# Patient Record
Sex: Male | Born: 1940
Health system: Southern US, Community
[De-identification: ages and names within clinical notes are randomized; demographics above are authoritative.]

## PROBLEM LIST (undated history)

## (undated) DIAGNOSIS — Z8711 Personal history of peptic ulcer disease: Secondary | ICD-10-CM

## (undated) DIAGNOSIS — I10 Essential (primary) hypertension: Secondary | ICD-10-CM

## (undated) DIAGNOSIS — Z8719 Personal history of other diseases of the digestive system: Secondary | ICD-10-CM

## (undated) DIAGNOSIS — H9190 Unspecified hearing loss, unspecified ear: Secondary | ICD-10-CM

## (undated) DIAGNOSIS — D696 Thrombocytopenia, unspecified: Secondary | ICD-10-CM

## (undated) DIAGNOSIS — K219 Gastro-esophageal reflux disease without esophagitis: Secondary | ICD-10-CM

## (undated) DIAGNOSIS — E119 Type 2 diabetes mellitus without complications: Secondary | ICD-10-CM

## (undated) DIAGNOSIS — J309 Allergic rhinitis, unspecified: Secondary | ICD-10-CM

## (undated) DIAGNOSIS — E785 Hyperlipidemia, unspecified: Secondary | ICD-10-CM

## (undated) HISTORY — DX: Essential (primary) hypertension: I10

## (undated) HISTORY — DX: Gastro-esophageal reflux disease without esophagitis: K21.9

## (undated) HISTORY — DX: Personal history of peptic ulcer disease: Z87.11

## (undated) HISTORY — DX: Type 2 diabetes mellitus without complications: E11.9

## (undated) HISTORY — DX: Allergic rhinitis, unspecified: J30.9

## (undated) HISTORY — DX: Unspecified hearing loss, unspecified ear: H91.90

## (undated) HISTORY — DX: Thrombocytopenia, unspecified: D69.6

## (undated) HISTORY — DX: Hyperlipidemia, unspecified: E78.5

## (undated) HISTORY — DX: Personal history of other diseases of the digestive system: Z87.19

---

## 1988-01-31 HISTORY — PX: NASAL SEPTUM SURGERY: SHX37

## 2005-12-29 ENCOUNTER — Ambulatory Visit: Payer: Self-pay | Admitting: Internal Medicine

## 2006-02-01 ENCOUNTER — Ambulatory Visit: Payer: Self-pay | Admitting: Internal Medicine

## 2006-08-29 ENCOUNTER — Ambulatory Visit: Payer: Self-pay | Admitting: Internal Medicine

## 2006-08-29 LAB — CONVERTED CEMR LAB
Basophils Relative: 0 % (ref 0–1)
HDL: 36 mg/dL — ABNORMAL LOW (ref >39)
Hemoglobin: 15 g/dL (ref 13.0–17.0)
LDL Cholesterol: 118 mg/dL — ABNORMAL HIGH (ref 0–99)
Lymphocytes Relative: 38 % (ref 12–46)
Lymphs Abs: 2 10*3/uL (ref 0.7–3.3)
Monocytes Absolute: 0.4 10*3/uL (ref 0.2–0.7)
Monocytes Relative: 7 % (ref 3–11)
Neutro Abs: 2.8 10*3/uL (ref 1.7–7.7)
Neutrophils Relative %: 54 % (ref 43–77)
PSA: 0.32 ng/mL (ref 0.10–4.00)
RBC: 4.87 M/uL (ref 4.22–5.81)
Total CHOL/HDL Ratio: 5.2
Triglycerides: 159 mg/dL — ABNORMAL HIGH (ref <150)
VLDL: 32 mg/dL (ref 0–40)
WBC: 5.2 10*3/uL (ref 4.0–10.5)

## 2006-09-28 ENCOUNTER — Ambulatory Visit: Payer: Self-pay | Admitting: Internal Medicine

## 2006-09-28 DIAGNOSIS — I152 Hypertension secondary to endocrine disorders: Secondary | ICD-10-CM | POA: Insufficient documentation

## 2006-09-28 DIAGNOSIS — I10 Essential (primary) hypertension: Secondary | ICD-10-CM

## 2006-09-28 DIAGNOSIS — K219 Gastro-esophageal reflux disease without esophagitis: Secondary | ICD-10-CM | POA: Insufficient documentation

## 2006-09-28 LAB — CONVERTED CEMR LAB
Basophils Absolute: 0 10*3/uL (ref 0.0–0.1)
Basophils Relative: 1 % (ref 0–1)
Eosinophils Relative: 1 % (ref 0–5)
Hemoglobin: 15 g/dL (ref 13.0–17.0)
MCHC: 33.1 g/dL (ref 30.0–36.0)
Monocytes Absolute: 0.4 10*3/uL (ref 0.2–0.7)
Neutro Abs: 3.9 10*3/uL (ref 1.7–7.7)
Platelets: 117 10*3/uL — ABNORMAL LOW (ref 150–400)
RDW: 13.1 % (ref 11.5–14.0)

## 2006-10-16 ENCOUNTER — Ambulatory Visit: Payer: Self-pay | Admitting: Internal Medicine

## 2006-10-24 LAB — CONVERTED CEMR LAB: HCV Ab: NEGATIVE

## 2007-04-26 ENCOUNTER — Ambulatory Visit: Payer: Self-pay | Admitting: Internal Medicine

## 2007-04-26 DIAGNOSIS — M542 Cervicalgia: Secondary | ICD-10-CM | POA: Insufficient documentation

## 2007-04-26 DIAGNOSIS — K279 Peptic ulcer, site unspecified, unspecified as acute or chronic, without hemorrhage or perforation: Secondary | ICD-10-CM | POA: Insufficient documentation

## 2007-05-02 LAB — CONVERTED CEMR LAB
ALT: 22 units/L (ref 0–53)
AST: 24 units/L (ref 0–37)
Albumin: 4.7 g/dL (ref 3.5–5.2)
Alkaline Phosphatase: 80 units/L (ref 39–117)
Basophils Relative: 0 % (ref 0–1)
Eosinophils Absolute: 0.1 10*3/uL (ref 0.0–0.7)
MCHC: 34.4 g/dL (ref 30.0–36.0)
MCV: 91 fL (ref 78.0–100.0)
Neutro Abs: 3.6 10*3/uL (ref 1.7–7.7)
Neutrophils Relative %: 59 % (ref 43–77)
Platelets: 114 10*3/uL — ABNORMAL LOW (ref 150–400)
Potassium: 4.1 meq/L (ref 3.5–5.3)
Sodium: 144 meq/L (ref 135–145)
Total Protein: 7.1 g/dL (ref 6.0–8.3)
WBC: 6 10*3/uL (ref 4.0–10.5)

## 2007-05-06 ENCOUNTER — Ambulatory Visit: Payer: Self-pay | Admitting: *Deleted

## 2007-05-08 ENCOUNTER — Ambulatory Visit: Payer: Self-pay | Admitting: Internal Medicine

## 2007-05-08 DIAGNOSIS — R7309 Other abnormal glucose: Secondary | ICD-10-CM

## 2007-06-13 ENCOUNTER — Ambulatory Visit: Payer: Self-pay | Admitting: Internal Medicine

## 2007-06-13 DIAGNOSIS — J309 Allergic rhinitis, unspecified: Secondary | ICD-10-CM | POA: Insufficient documentation

## 2007-06-13 DIAGNOSIS — E785 Hyperlipidemia, unspecified: Secondary | ICD-10-CM

## 2007-06-13 DIAGNOSIS — D485 Neoplasm of uncertain behavior of skin: Secondary | ICD-10-CM

## 2007-06-13 DIAGNOSIS — N508 Other specified disorders of male genital organs: Secondary | ICD-10-CM | POA: Insufficient documentation

## 2007-06-13 LAB — CONVERTED CEMR LAB
HDL: 37 mg/dL — ABNORMAL LOW (ref >39)
LDL Cholesterol: 100 mg/dL — ABNORMAL HIGH (ref 0–99)
Nitrite: NEGATIVE
Protein, U semiquant: NEGATIVE
Specific Gravity, Urine: <1.005
Urobilinogen, UA: 0.2
WBC Urine, dipstick: NEGATIVE

## 2007-06-17 ENCOUNTER — Encounter (INDEPENDENT_AMBULATORY_CARE_PROVIDER_SITE_OTHER): Payer: Self-pay | Admitting: Internal Medicine

## 2007-06-19 ENCOUNTER — Ambulatory Visit (HOSPITAL_COMMUNITY): Admission: RE | Admit: 2007-06-19 | Discharge: 2007-06-19 | Payer: Self-pay | Admitting: Internal Medicine

## 2007-08-20 ENCOUNTER — Ambulatory Visit: Payer: Self-pay | Admitting: Internal Medicine

## 2007-09-03 ENCOUNTER — Encounter (INDEPENDENT_AMBULATORY_CARE_PROVIDER_SITE_OTHER): Payer: Self-pay | Admitting: Internal Medicine

## 2007-09-04 ENCOUNTER — Telehealth (INDEPENDENT_AMBULATORY_CARE_PROVIDER_SITE_OTHER): Payer: Self-pay | Admitting: Internal Medicine

## 2007-09-13 ENCOUNTER — Ambulatory Visit: Payer: Self-pay | Admitting: Internal Medicine

## 2007-09-14 ENCOUNTER — Encounter (INDEPENDENT_AMBULATORY_CARE_PROVIDER_SITE_OTHER): Payer: Self-pay | Admitting: Internal Medicine

## 2007-09-14 LAB — CONVERTED CEMR LAB
ALT: 21 units/L (ref 0–53)
CO2: 28 meq/L (ref 19–32)
Chloride: 104 meq/L (ref 96–112)
Cholesterol: 188 mg/dL (ref 0–200)
Potassium: 4.3 meq/L (ref 3.5–5.3)
Sodium: 143 meq/L (ref 135–145)
Total Bilirubin: 0.6 mg/dL (ref 0.3–1.2)
Total Protein: 6.9 g/dL (ref 6.0–8.3)
VLDL: 55 mg/dL — ABNORMAL HIGH (ref 0–40)

## 2007-11-29 ENCOUNTER — Ambulatory Visit: Payer: Self-pay | Admitting: Internal Medicine

## 2008-02-07 ENCOUNTER — Telehealth (INDEPENDENT_AMBULATORY_CARE_PROVIDER_SITE_OTHER): Payer: Self-pay | Admitting: Internal Medicine

## 2008-02-28 ENCOUNTER — Ambulatory Visit: Payer: Self-pay | Admitting: Internal Medicine

## 2008-06-16 IMAGING — US US SCROTUM
1 series · 14 of 25 positions shown · non-contrast
Comparison: None.

CLINICAL DATA: Right scrotal mass, left epididymal thickening

ULTRASOUND OF SCROTUM
TECHNIQUE: Complete ultrasound examination of the testicles,
epididymis, and other scrotal structures was performed.

[Series 1: unknown · 0.07mm/px · 14 of 56 slices shown]
[im 1/56]
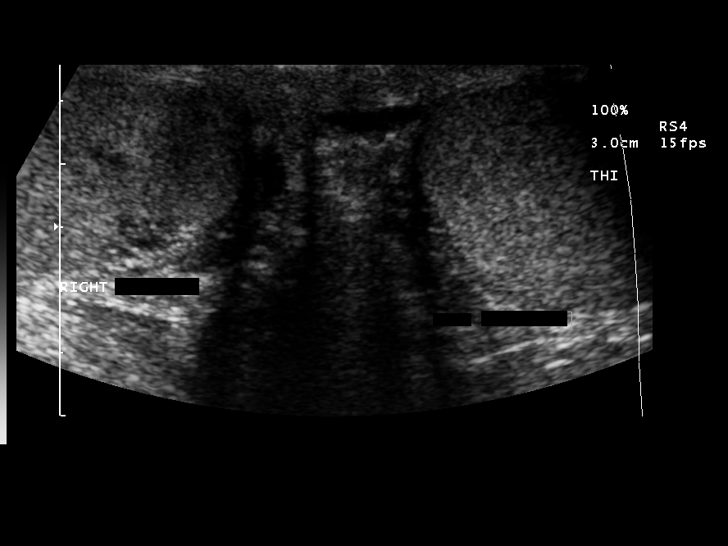
[im 5/56]
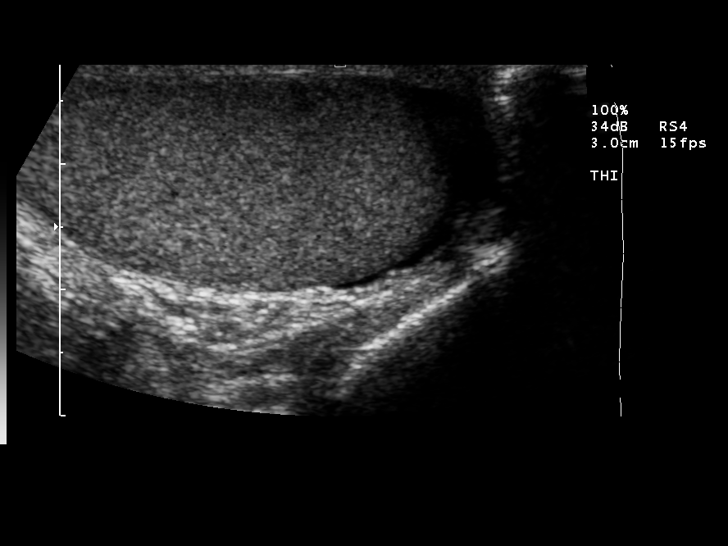
[im 10/56]
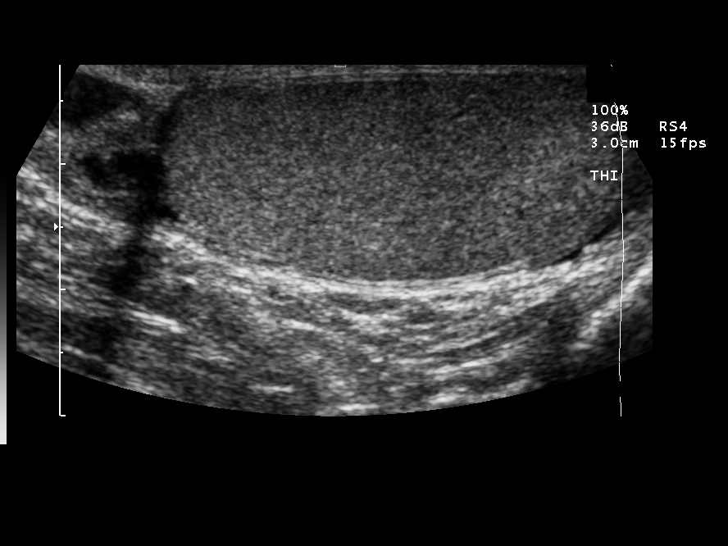
[im 14/56]
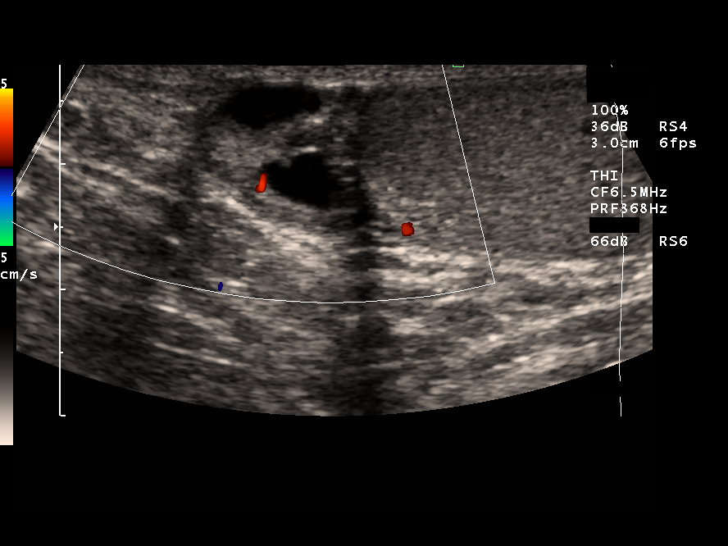
[im 19/56]
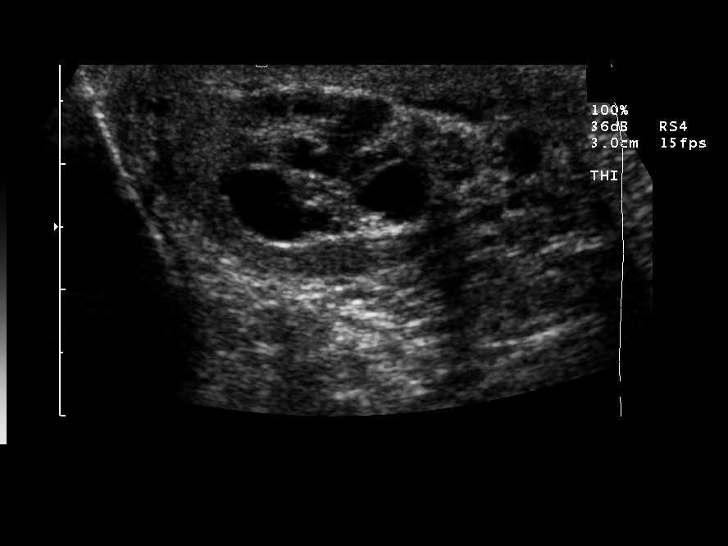
[im 21/56]
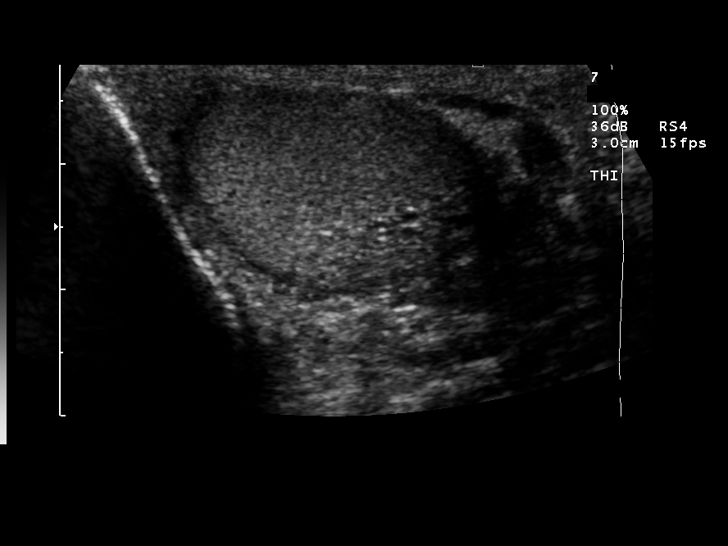
[im 26/56]
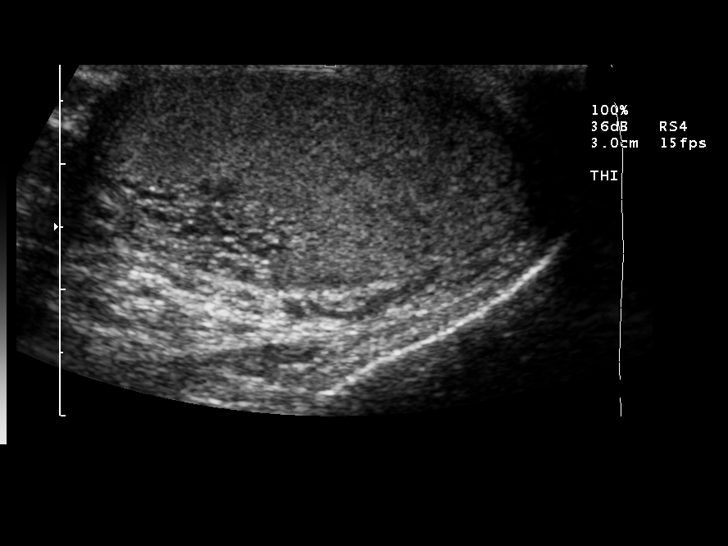
[im 30/56]
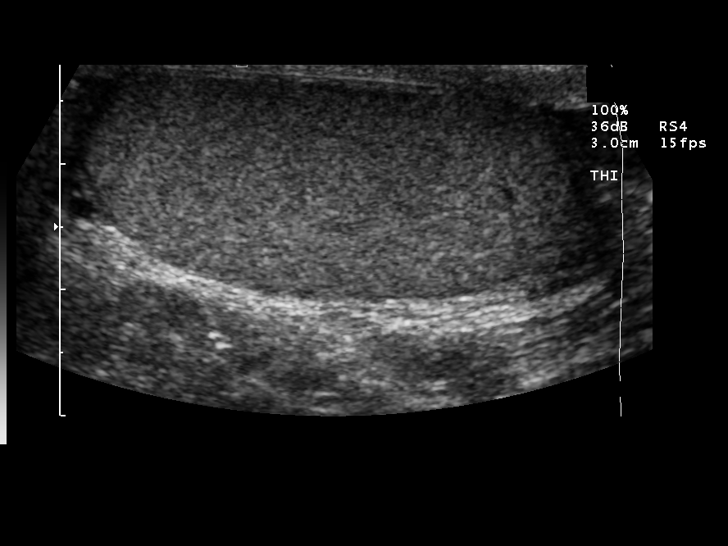
[im 35/56]
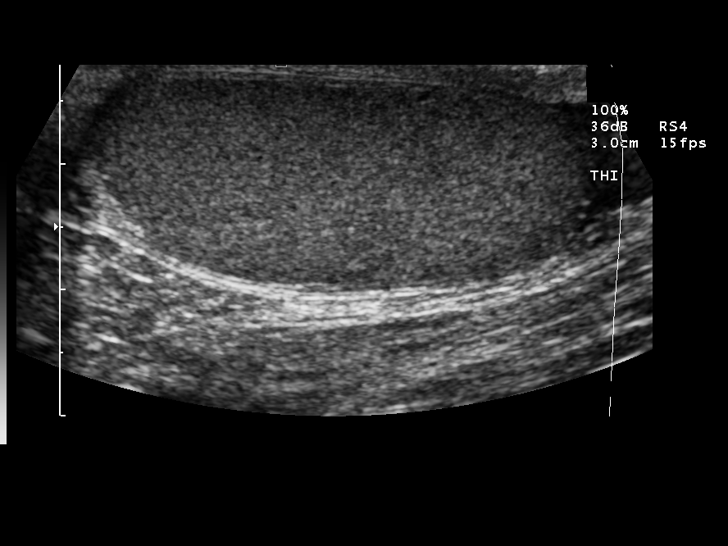
[im 37/56]
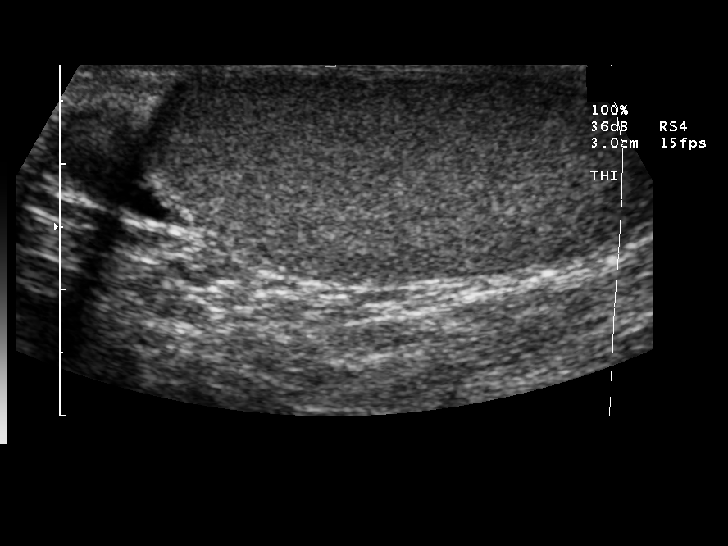
[im 42/56]
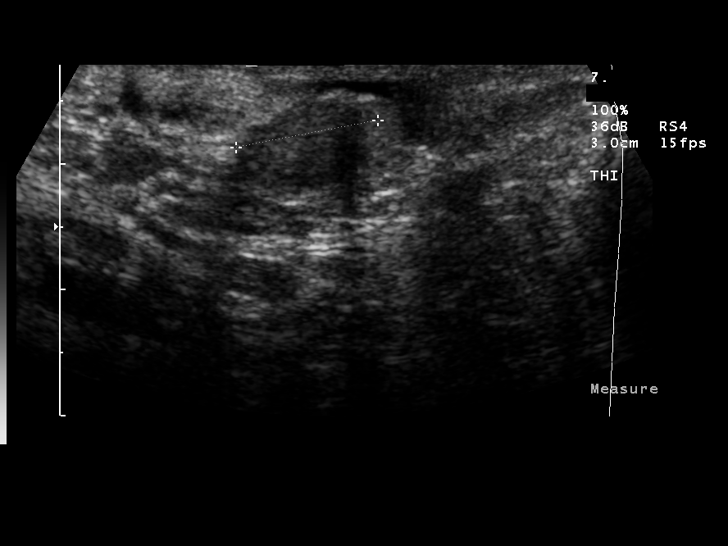
[im 46/56]
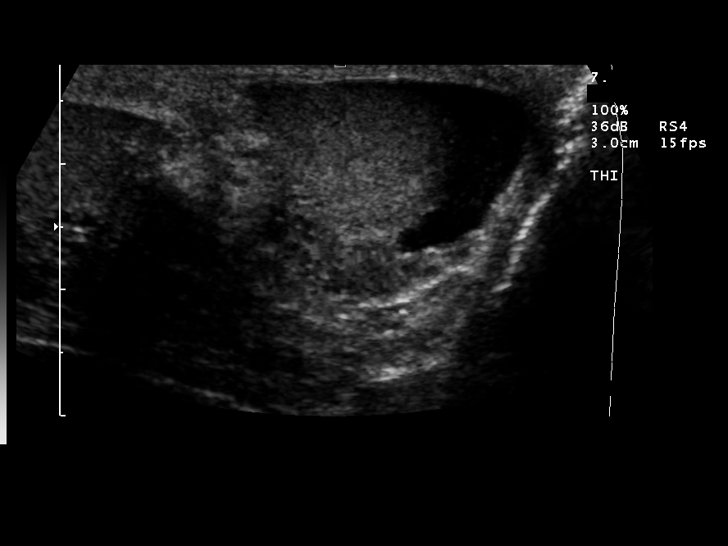
[im 51/56]
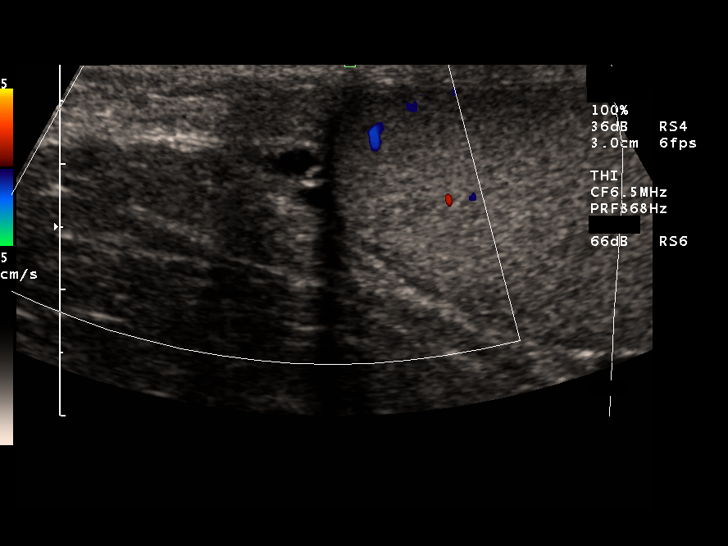
[im 56/56]
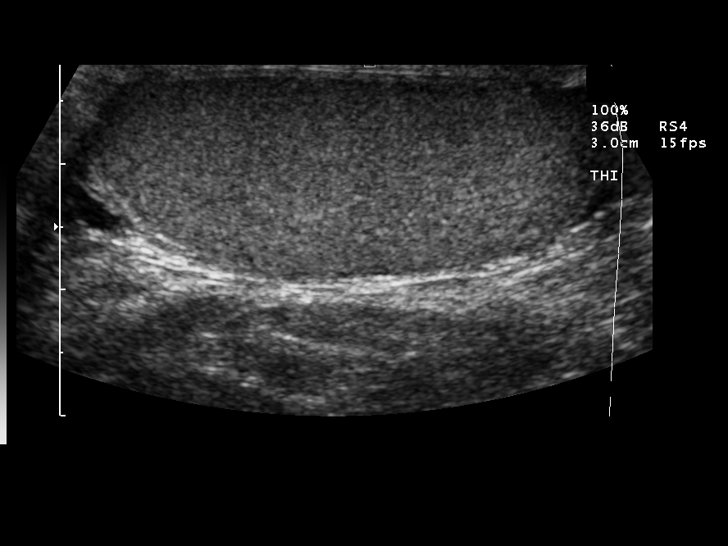

[14 of 25 positions shown; findings below may reference images not displayed]

FINDINGS: Right testicle measures 3.5 x 1.7 x 3.1 cm.  Normal color
Doppler flow.  No hyperemia.  Left testicle measures 4.1 x 1.8 x
3.0 cm.  No definite intra testicular abnormality.  There is a
small left hydrocele noted.  Left epididymis demonstrates a small 4
mm cyst in the epididymal head.  The right epididymis demonstrates
a 2 cm complex cystic area with septation in the epididymal head
consistent with a minimally complex epididymal cyst versus
spermatocele. No varicocele demonstrated.
IMPRESSION: 2 cm right epididymal head complex cyst versus spermatocele

No definite testicular abnormality.

4 mm left epididymal cyst.

Small simple left hydrocele.

## 2008-06-25 ENCOUNTER — Encounter (INDEPENDENT_AMBULATORY_CARE_PROVIDER_SITE_OTHER): Payer: Self-pay | Admitting: Internal Medicine

## 2008-07-02 ENCOUNTER — Ambulatory Visit: Payer: Self-pay | Admitting: Internal Medicine

## 2008-07-20 LAB — CONVERTED CEMR LAB
HDL: 34 mg/dL — ABNORMAL LOW (ref >39)
LDL Cholesterol: 125 mg/dL — ABNORMAL HIGH (ref 0–99)
Triglycerides: 230 mg/dL — ABNORMAL HIGH (ref <150)

## 2008-09-04 ENCOUNTER — Ambulatory Visit: Payer: Self-pay | Admitting: Internal Medicine

## 2008-09-04 LAB — CONVERTED CEMR LAB
Blood in Urine, dipstick: NEGATIVE
Nitrite: NEGATIVE
Protein, U semiquant: 30
WBC Urine, dipstick: NEGATIVE

## 2008-09-09 ENCOUNTER — Ambulatory Visit: Payer: Self-pay | Admitting: Internal Medicine

## 2008-09-14 ENCOUNTER — Encounter (INDEPENDENT_AMBULATORY_CARE_PROVIDER_SITE_OTHER): Payer: Self-pay | Admitting: Internal Medicine

## 2008-10-07 ENCOUNTER — Ambulatory Visit: Payer: Self-pay | Admitting: Internal Medicine

## 2008-10-07 LAB — CONVERTED CEMR LAB
Albumin: 4.2 g/dL (ref 3.5–5.2)
Alkaline Phosphatase: 58 units/L (ref 39–117)
BUN: 23 mg/dL (ref 6–23)
Basophils Absolute: 0 10*3/uL (ref 0.0–0.1)
CO2: 27 meq/L (ref 19–32)
Calcium: 8.9 mg/dL (ref 8.4–10.5)
Chloride: 104 meq/L (ref 96–112)
Glucose, Bld: 81 mg/dL (ref 70–99)
LDL Cholesterol: 106 mg/dL — ABNORMAL HIGH (ref 0–99)
MCHC: 32.6 g/dL (ref 30.0–36.0)
MCV: 95.6 fL (ref 78.0–100.0)
Neutrophils Relative %: 55 % (ref 43–77)
Platelets: 101 10*3/uL — ABNORMAL LOW (ref 150–400)
Potassium: 4.4 meq/L (ref 3.5–5.3)
RDW: 14 % (ref 11.5–15.5)
Sodium: 143 meq/L (ref 135–145)
Total Protein: 6.7 g/dL (ref 6.0–8.3)
Triglycerides: 122 mg/dL (ref <150)

## 2008-10-15 ENCOUNTER — Encounter (INDEPENDENT_AMBULATORY_CARE_PROVIDER_SITE_OTHER): Payer: Self-pay | Admitting: Internal Medicine

## 2008-11-09 ENCOUNTER — Ambulatory Visit: Payer: Self-pay | Admitting: Internal Medicine

## 2009-01-30 HISTORY — PX: COLONOSCOPY: SHX174

## 2009-03-18 ENCOUNTER — Ambulatory Visit: Payer: Self-pay | Admitting: Internal Medicine

## 2009-03-18 DIAGNOSIS — R634 Abnormal weight loss: Secondary | ICD-10-CM | POA: Insufficient documentation

## 2009-03-26 ENCOUNTER — Encounter (INDEPENDENT_AMBULATORY_CARE_PROVIDER_SITE_OTHER): Payer: Self-pay | Admitting: Internal Medicine

## 2009-03-30 ENCOUNTER — Encounter (INDEPENDENT_AMBULATORY_CARE_PROVIDER_SITE_OTHER): Payer: Self-pay | Admitting: Internal Medicine

## 2009-04-03 ENCOUNTER — Encounter (INDEPENDENT_AMBULATORY_CARE_PROVIDER_SITE_OTHER): Payer: Self-pay | Admitting: Internal Medicine

## 2009-04-07 LAB — CONVERTED CEMR LAB
ALT: 26 units/L (ref 0–53)
Albumin: 4.4 g/dL (ref 3.5–5.2)
CO2: 28 meq/L (ref 19–32)
Calcium: 9.3 mg/dL (ref 8.4–10.5)
Chloride: 105 meq/L (ref 96–112)
Cholesterol: 192 mg/dL (ref 0–200)
Eosinophils Absolute: 0.1 10*3/uL (ref 0.0–0.7)
Hemoglobin: 14.4 g/dL (ref 13.0–17.0)
Lymphs Abs: 1.7 10*3/uL (ref 0.7–4.0)
MCV: 93.8 fL (ref 78.0–100.0)
Monocytes Absolute: 0.4 10*3/uL (ref 0.1–1.0)
Monocytes Relative: 8 % (ref 3–12)
Neutro Abs: 3.2 10*3/uL (ref 1.7–7.7)
Neutrophils Relative %: 59 % (ref 43–77)
RBC: 4.86 M/uL (ref 4.22–5.81)
Sodium: 144 meq/L (ref 135–145)
Total Protein: 7.4 g/dL (ref 6.0–8.3)
WBC: 5.4 10*3/uL (ref 4.0–10.5)

## 2009-04-08 ENCOUNTER — Ambulatory Visit (HOSPITAL_COMMUNITY): Admission: RE | Admit: 2009-04-08 | Discharge: 2009-04-08 | Payer: Self-pay | Admitting: Gastroenterology

## 2009-04-08 ENCOUNTER — Telehealth (INDEPENDENT_AMBULATORY_CARE_PROVIDER_SITE_OTHER): Payer: Self-pay | Admitting: Internal Medicine

## 2009-04-19 ENCOUNTER — Telehealth (INDEPENDENT_AMBULATORY_CARE_PROVIDER_SITE_OTHER): Payer: Self-pay | Admitting: Internal Medicine

## 2009-04-20 ENCOUNTER — Telehealth (INDEPENDENT_AMBULATORY_CARE_PROVIDER_SITE_OTHER): Payer: Self-pay | Admitting: Internal Medicine

## 2009-04-29 ENCOUNTER — Ambulatory Visit: Payer: Self-pay | Admitting: Internal Medicine

## 2009-05-14 LAB — CONVERTED CEMR LAB: Platelets: 99 10*3/uL — ABNORMAL LOW (ref 150–400)

## 2009-05-28 ENCOUNTER — Ambulatory Visit: Payer: Self-pay | Admitting: Internal Medicine

## 2009-06-27 ENCOUNTER — Encounter (INDEPENDENT_AMBULATORY_CARE_PROVIDER_SITE_OTHER): Payer: Self-pay | Admitting: Internal Medicine

## 2009-06-27 ENCOUNTER — Telehealth (INDEPENDENT_AMBULATORY_CARE_PROVIDER_SITE_OTHER): Payer: Self-pay | Admitting: Internal Medicine

## 2009-06-27 LAB — CONVERTED CEMR LAB
Haptoglobin: 123 mg/dL (ref 16–200)
LDH: 161 units/L (ref 94–250)

## 2009-07-02 ENCOUNTER — Ambulatory Visit: Payer: Self-pay | Admitting: Oncology

## 2009-07-14 ENCOUNTER — Encounter (INDEPENDENT_AMBULATORY_CARE_PROVIDER_SITE_OTHER): Payer: Self-pay | Admitting: Internal Medicine

## 2009-07-14 LAB — CBC WITH DIFFERENTIAL/PLATELET
BASO%: 0.6 % (ref 0.0–2.0)
Basophils Absolute: 0 10*3/uL (ref 0.0–0.1)
EOS%: 2.3 % (ref 0.0–7.0)
MCH: 30.9 pg (ref 27.2–33.4)
MCHC: 33.5 g/dL (ref 32.0–36.0)
MCV: 92.2 fL (ref 79.3–98.0)
MONO%: 6.4 % (ref 0.0–14.0)
RBC: 4.61 10*6/uL (ref 4.20–5.82)
RDW: 13.4 % (ref 11.0–14.6)
lymph#: 1.3 10*3/uL (ref 0.9–3.3)

## 2009-07-14 LAB — COMPREHENSIVE METABOLIC PANEL
AST: 27 U/L (ref 0–37)
Albumin: 3.8 g/dL (ref 3.5–5.2)
Alkaline Phosphatase: 69 U/L (ref 39–117)
BUN: 23 mg/dL (ref 6–23)
Calcium: 9.3 mg/dL (ref 8.4–10.5)
Creatinine, Ser: 1.07 mg/dL (ref 0.40–1.50)
Glucose, Bld: 114 mg/dL — ABNORMAL HIGH (ref 70–99)
Potassium: 4.4 mEq/L (ref 3.5–5.3)

## 2009-07-14 LAB — MORPHOLOGY: PLT EST: DECREASED

## 2009-07-16 LAB — VITAMIN B12: Vitamin B-12: 441 pg/mL (ref 211–911)

## 2009-07-16 LAB — PROTEIN ELECTROPHORESIS, SERUM
Albumin ELP: 61.8 % (ref 55.8–66.1)
Total Protein, Serum Electrophoresis: 6.8 g/dL (ref 6.0–8.3)

## 2009-07-16 LAB — HEPATITIS B SURFACE ANTIBODY,QUALITATIVE: Hep B S Ab: NEGATIVE

## 2009-07-16 LAB — ANA: Anti Nuclear Antibody(ANA): NEGATIVE

## 2009-07-16 LAB — HEPATITIS B SURFACE ANTIGEN: Hepatitis B Surface Ag: NEGATIVE

## 2009-07-29 ENCOUNTER — Encounter (INDEPENDENT_AMBULATORY_CARE_PROVIDER_SITE_OTHER): Payer: Self-pay | Admitting: Internal Medicine

## 2009-08-05 ENCOUNTER — Ambulatory Visit: Payer: Self-pay | Admitting: Oncology

## 2009-08-09 ENCOUNTER — Ambulatory Visit: Payer: Self-pay | Admitting: Internal Medicine

## 2009-08-09 LAB — CONVERTED CEMR LAB
Basophils Relative: 1 % (ref 0–1)
Lymphs Abs: 1.7 10*3/uL (ref 0.7–4.0)
MCHC: 33.8 g/dL (ref 30.0–36.0)
Monocytes Relative: 9 % (ref 3–12)
Neutro Abs: 3.5 10*3/uL (ref 1.7–7.7)
Neutrophils Relative %: 60 % (ref 43–77)
RBC: 4.56 M/uL (ref 4.22–5.81)
WBC: 5.8 10*3/uL (ref 4.0–10.5)

## 2009-09-07 ENCOUNTER — Ambulatory Visit: Payer: Self-pay | Admitting: Internal Medicine

## 2009-09-15 ENCOUNTER — Telehealth (INDEPENDENT_AMBULATORY_CARE_PROVIDER_SITE_OTHER): Payer: Self-pay | Admitting: Nurse Practitioner

## 2009-09-16 ENCOUNTER — Encounter (INDEPENDENT_AMBULATORY_CARE_PROVIDER_SITE_OTHER): Payer: Self-pay | Admitting: Internal Medicine

## 2009-09-16 LAB — CONVERTED CEMR LAB
Basophils Relative: 0 % (ref 0–1)
Hemoglobin: 13.9 g/dL (ref 13.0–17.0)
MCHC: 33.2 g/dL (ref 30.0–36.0)
Monocytes Absolute: 0.4 10*3/uL (ref 0.1–1.0)
Monocytes Relative: 8 % (ref 3–12)
Neutro Abs: 2.5 10*3/uL (ref 1.7–7.7)
RBC: 4.52 M/uL (ref 4.22–5.81)

## 2009-10-14 ENCOUNTER — Ambulatory Visit: Payer: Self-pay | Admitting: Internal Medicine

## 2009-10-14 LAB — CONVERTED CEMR LAB
Blood in Urine, dipstick: NEGATIVE
HDL goal, serum: 40 mg/dL
Ketones, urine, test strip: NEGATIVE
LDL Goal: 130 mg/dL
Nitrite: NEGATIVE
Urobilinogen, UA: 1

## 2009-11-03 ENCOUNTER — Ambulatory Visit: Payer: Self-pay | Admitting: Internal Medicine

## 2009-11-03 LAB — CONVERTED CEMR LAB
ALT: 22 units/L (ref 0–53)
AST: 21 units/L (ref 0–37)
Albumin: 4.3 g/dL (ref 3.5–5.2)
Calcium: 9.1 mg/dL (ref 8.4–10.5)
Chloride: 103 meq/L (ref 96–112)
Hgb A1c MFr Bld: 5.9 % — ABNORMAL HIGH (ref <5.7)
Potassium: 5.1 meq/L (ref 3.5–5.3)
Sodium: 142 meq/L (ref 135–145)
Total Protein: 6.8 g/dL (ref 6.0–8.3)

## 2009-11-08 ENCOUNTER — Encounter (INDEPENDENT_AMBULATORY_CARE_PROVIDER_SITE_OTHER): Payer: Self-pay | Admitting: Internal Medicine

## 2009-11-09 ENCOUNTER — Ambulatory Visit: Payer: Self-pay | Admitting: Oncology

## 2009-11-11 ENCOUNTER — Encounter (INDEPENDENT_AMBULATORY_CARE_PROVIDER_SITE_OTHER): Payer: Self-pay | Admitting: Internal Medicine

## 2009-11-11 LAB — COMPREHENSIVE METABOLIC PANEL
ALT: 23 U/L (ref 0–53)
AST: 24 U/L (ref 0–37)
Alkaline Phosphatase: 56 U/L (ref 39–117)
Calcium: 8.8 mg/dL (ref 8.4–10.5)
Chloride: 103 mEq/L (ref 96–112)
Creatinine, Ser: 0.96 mg/dL (ref 0.40–1.50)

## 2009-11-11 LAB — CHCC SMEAR

## 2009-11-11 LAB — CBC WITH DIFFERENTIAL/PLATELET
BASO%: 0.6 % (ref 0.0–2.0)
Basophils Absolute: 0 10*3/uL (ref 0.0–0.1)
HCT: 39.3 % (ref 38.4–49.9)
HGB: 13.7 g/dL (ref 13.0–17.1)
MCHC: 34.9 g/dL (ref 32.0–36.0)
MONO#: 0.4 10*3/uL (ref 0.1–0.9)
NEUT%: 60.5 % (ref 39.0–75.0)
RDW: 13 % (ref 11.0–14.6)
WBC: 4.7 10*3/uL (ref 4.0–10.3)
lymph#: 1.3 10*3/uL (ref 0.9–3.3)

## 2009-11-11 LAB — MORPHOLOGY: PLT EST: DECREASED

## 2009-12-30 ENCOUNTER — Encounter (INDEPENDENT_AMBULATORY_CARE_PROVIDER_SITE_OTHER): Payer: Self-pay | Admitting: Internal Medicine

## 2009-12-30 DIAGNOSIS — D696 Thrombocytopenia, unspecified: Secondary | ICD-10-CM

## 2010-02-11 ENCOUNTER — Encounter (INDEPENDENT_AMBULATORY_CARE_PROVIDER_SITE_OTHER): Payer: Self-pay | Admitting: Internal Medicine

## 2010-02-11 LAB — CONVERTED CEMR LAB
Basophils Absolute: 0 10*3/uL (ref 0.0–0.1)
Basophils Relative: 0 % (ref 0–1)
Eosinophils Absolute: 0.1 10*3/uL (ref 0.0–0.7)
Eosinophils Relative: 2 % (ref 0–5)
HCT: 43.1 % (ref 39.0–52.0)
Hemoglobin: 15.1 g/dL (ref 13.0–17.0)
MCHC: 35 g/dL (ref 30.0–36.0)
MCV: 89.6 fL (ref 78.0–100.0)
Monocytes Absolute: 0.5 10*3/uL (ref 0.1–1.0)
Monocytes Relative: 8 % (ref 3–12)
Neutro Abs: 3.8 10*3/uL (ref 1.7–7.7)
RBC: 4.81 M/uL (ref 4.22–5.81)
RDW: 13.2 % (ref 11.5–15.5)

## 2010-02-18 ENCOUNTER — Ambulatory Visit: Payer: Self-pay | Admitting: Internal Medicine

## 2010-03-01 NOTE — Letter (Signed)
[facility]   Imported By: Arta Bruce 12/31/2009 11:32:03  _____________________________________________________________________  External Attachment:    Type:   Image     Comment:   External Document

## 2010-03-01 NOTE — Letter (Signed)
Summary: *Referral Letter  [facility]  [address] [place], [place] [address]   Phone: [phone]  Fax: [phone]    06/27/2009  Thank you in advance for agreeing to see my patient:  [facility] [address] [place], [place]  [address]  Phone: [phone]  Reason for Referral: Thrombocytopenia dating back to at least 2008.  HIV, Hepatitis C, haptoglobin, LDH all negative or normal.  No evidence of consumption, hyperspenism, liver disease.  Procedures Requested: Evaluation and recommendations  Current Medical Problems: 1)  WEIGHT LOSS, ABNORMAL (ICD-783.21) 2)  NEED PROPHYLACTIC VACCINATION&INOCULATION FLU (ICD-V04.81) 3)  ALLERGIC RHINITIS (ICD-477.9) 4)  INCOMPLETE RBBB (ICD-426.4) 5)  DYSLIPIDEMIA (ICD-272.4) 6)  NEOPLASM, SKIN, UNCERTAIN BEHAVIOR (ICD-238.2) 7)  TESTICULAR MASS (ICD-608.89) 8)  HEALTH MAINTENANCE EXAM (ICD-V70.0) 9)  HYPERGLYCEMIA, FASTING (ICD-790.29) 10)  NECK PAIN (ICD-723.1) 11)  PUD (ICD-533.90) 12)  THROMBOCYTOPENIA (ICD-287.5) 13)  HYPERTENSION (ICD-401.9) 14)  GERD (ICD-530.81)   Current Medications: 1)  LISINOPRIL-HYDROCHLOROTHIAZIDE 20-12.5 MG  TABS (LISINOPRIL-HYDROCHLOROTHIAZIDE) 1 tab by mouth daily in a.m. 2)  NASACORT AQ 55 MCG/ACT  AERS (TRIAMCINOLONE ACETONIDE(NASAL)) 2 sprays each nostril daily 3)  LOVAZA 1 GM CAPS (OMEGA-3-ACID ETHYL ESTERS) 4 caps by mouth daily 4)  ASPIR-LOW 81 MG TBEC (ASPIRIN) 1 by mouth once daily 5)  FEXOFENADINE HCL 180 MG TABS (FEXOFENADINE HCL) 1 tab by mouth daily as needed allergies 6)  FAMOTIDINE 20 MG TABS (FAMOTIDINE) 2 tabs by mouth at bedtime   Past Medical History: 1)  NEED PROPHYLACTIC VACCINATION&INOCULATION FLU (ICD-V04.81) 2)  ALLERGIC RHINITIS (ICD-477.9) 3)  INCOMPLETE RBBB (ICD-426.4) 4)  DYSLIPIDEMIA (ICD-272.4) 5)  NEOPLASM, SKIN, UNCERTAIN BEHAVIOR (ICD-238.2) 6)  TESTICULAR MASS (ICD-608.89) 7)  HEALTH MAINTENANCE EXAM (ICD-V70.0) 8)  HYPERGLYCEMIA,  FASTING (ICD-790.29) 9)  NECK PAIN (ICD-723.1) 10)  PUD (ICD-533.90) 11)  THROMBOCYTOPENIA (ICD-287.5) 12)  HYPERTENSION (ICD-401.9) 13)  GERD (ICD-530.81)     Thank you again for agreeing to see our patient; please contact us if you have any further questions or need additional information.  Sincerely,  Julieanne Manson MD

## 2010-03-01 NOTE — Assessment & Plan Note (Signed)
Summary: *CPE & MEDS REFILL PER DR Avier Jech/NS   Vital Signs:  Patient profile:   70 year old male Height:      65 inches Weight:      157.4 pounds BMI:     26.29 Temp:     96.9 degrees F oral Pulse rate:   58 / minute Pulse rhythm:   regular Resp:     18 per minute BP sitting:   116 / 76  (right arm) Cuff size:   regular  Vitals Entered By: CMA Student Linzie Collin CC: CPE and medications refill, patient currently having a cough for about three days with conjestion, Lipid Management Is Patient Diabetic? No Pain Assessment Patient in pain? no       Does patient need assistance? Functional Status Self care Ambulation Normal   CC:  CPE and medications refill, patient currently having a cough for about three days with conjestion, and Lipid Management.  History of Present Illness: 70 yo male here for CPE.  Concerns:  1.  Cough and congestion for 3 days:  Had something similar last year.  Is sneezing.  Some itching of nose, sometimes also eyes involved.  Clear nasal discharge.  Has to clear throat at times.  No lower respiratory symptoms.  Is using Fluticasone nasal spray--uses regularly.  Is not really not using the Allegra he has--last filled 2/11.     Current Medications (verified): 1)  Lisinopril-Hydrochlorothiazide 20-12.5 Mg  Tabs (Lisinopril-Hydrochlorothiazide) .Marland Kitchen.. 1 Tab By Mouth Daily in A.m. 2)  Fluticasone Propionate 50 Mcg/act Susp (Fluticasone Propionate) .... 2 Sprays Each Nostril Daily 3)  Lovaza 1 Gm Caps (Omega-3-Acid Ethyl Esters) .... 4 Caps By Mouth Daily 4)  Aspir-Low 81 Mg Tbec (Aspirin) .Marland Kitchen.. 1 By Mouth Once Daily 5)  Fexofenadine Hcl 180 Mg Tabs (Fexofenadine Hcl) .Marland Kitchen.. 1 Tab By Mouth Daily As Needed Allergies 6)  Famotidine 20 Mg Tabs (Famotidine) .... 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 Tobacco Use:  never Alchohol Use:  rare beer Drug Use:  never  Review of Systems General:  Energy is okay.. Eyes:  Vision fine with reading glasses.. ENT:  Sometimes difficulties with whistling noise in bilateral ears.  Some hearing loss with this.  No family history of hearing difficulties.  Has been exposed to loud noise with work--is exposed currently.  Not wearing ear plugs.  . CV:  Denies chest pain or discomfort and palpitations. Resp:  Denies shortness of breath. GI:  Denies abdominal pain and dark tarry stools; Blood intermittently on tissue when wipes--comes and goes.  Generally occurs when stool is hard.. GU:  Denies discharge and urinary frequency; Occasional dysuria--comes and goes.  Generally feels like empties totally with urination, other times, some difficulties.  No hesitancy.  Occasional nocturia.. MS:  Denies joint pain, joint redness, and joint swelling. Derm:  Denies lesion(s) and rash. Neuro:  Denies numbness, tingling, and weakness. Psych:  Denies anxiety, depression, and suicidal thoughts/plans; PHQ-9:  Scored a 5---problems concentrating (2) only because his eyes get tired--so really at least a 3 or 4..  Physical Exam  General:  Well-developed,well-nourished,in no acute distress; alert,appropriate and cooperative throughout examination Head:  Normocephalic and atraumatic without obvious abnormalities. No apparent alopecia  or balding. Eyes:  No corneal or conjunctival inflammation noted. EOMI. Perrla. Funduscopic exam benign, without hemorrhages, exudates or papilledema. Vision grossly normal. Ears:  External ear exam shows no significant lesions or deformities.  Otoscopic examination reveals clear canals, tympanic membranes are intact bilaterally without bulging, retraction, inflammation or  discharge. Hearing is grossly normal bilaterally. Nose:  Swollen nasal mucosa with clear discharge Mouth:  Oral mucosa and oropharynx without lesions or exudates.  Teeth in good repair. Neck:  No deformities, masses, or tenderness noted. Chest Wall:  No deformities, masses, tenderness or gynecomastia noted. Lungs:  Normal respiratory effort, chest expands symmetrically. Lungs are clear to auscultation, no crackles or wheezes. Heart:  Normal rate and regular rhythm. S1 and S2 normal without gallop, murmur, click, rub or other extra sounds. Abdomen:  Bowel sounds positive,abdomen soft and non-tender without masses, organomegaly or hernias noted. Rectal:  No external abnormalities noted. Normal sphincter tone. No rectal masses or tenderness.  Heme negative light brown stool. Genitalia:  Testes bilaterally descended without tiny nodule on right, no tenderness.. No  other scrotal masses or lesions. No penis lesions or urethral discharge.uncircumcised.   Prostate:  Prostate gland firm and smooth, no enlargement, nodularity, tenderness, mass, asymmetry or induration. Msk:  No deformity or scoliosis noted of thoracic or lumbar spine.   Pulses:  R and L carotid,radial,femoral,dorsalis pedis and posterior tibial pulses are full and equal bilaterally Extremities:  No clubbing, cyanosis, edema, or deformity noted with normal full range of motion of all joints.   Neurologic:  No cranial nerve deficits noted. Station and gait are normal. Plantar reflexes are down-going bilaterally. DTRs are symmetrical throughout. Sensory, motor and coordinative functions appear intact. Skin:  oval 2 cm freckled lesion, nonpalpable on right low back--no change Cervical Nodes:  No lymphadenopathy noted Axillary Nodes:  No palpable lymphadenopathy Inguinal Nodes:  No significant adenopathy Psych:  Cognition and judgment appear intact. Alert and cooperative with normal attention span and concentration. No apparent delusions,  illusions, hallucinations.  Daughter states her father is down a bit as he is having difficulty learning English for citizenship   Impression & Recommendations:  Problem # 1:  HEALTH MAINTENANCE EXAM (ICD-V70.0) Guaiac cards x 3 given, to return in 2 weeks. Schedule fasting labs Normal PSA in 2010 with normal digital exam today  Problem # 2:  THROMBOCYTOPENIA (ICD-287.5) Followed by Hematology.  Problem # 3:  ALLERGIC RHINITIS (ICD-477.9) Start nasal corticosteroids and Fexofenadine if available. His updated medication list for this problem includes:    Fluticasone Propionate 50 Mcg/act Susp (Fluticasone propionate) .Marland Kitchen... 2 sprays each nostril daily    Fexofenadine Hcl 180 Mg Tabs (Fexofenadine hcl) .Marland Kitchen... 1 tab by mouth daily as needed allergies  Problem # 4:  DYSLIPIDEMIA (ICD-272.4) To return for fasting labs. His updated medication list for this problem includes:    Lovaza 1 Gm Caps (Omega-3-acid ethyl esters) .Marland KitchenMarland KitchenMarland KitchenMarland Kitchen 4 caps by mouth daily  Problem # 5:  HYPERTENSION (ICD-401.9)  controlled His updated medication list for this problem includes:    Lisinopril-hydrochlorothiazide 20-12.5 Mg Tabs (Lisinopril-hydrochlorothiazide) .Marland Kitchen... 1 tab by mouth daily in a.m.  Orders: UA Dipstick w/o Micro (manual) (62694)  Complete Medication List: 1)  Lisinopril-hydrochlorothiazide 20-12.5 Mg Tabs (Lisinopril-hydrochlorothiazide) .Marland Kitchen.. 1 tab by mouth daily in a.m. 2)  Fluticasone Propionate 50 Mcg/act Susp (Fluticasone propionate) .... 2 sprays each nostril daily 3)  Lovaza 1 Gm Caps (Omega-3-acid ethyl esters) .... 4 caps by mouth daily 4)  Aspir-low 81 Mg Tbec (Aspirin) .Marland Kitchen.. 1 by  mouth once daily 5)  Fexofenadine Hcl 180 Mg Tabs (Fexofenadine hcl) .Marland Kitchen.. 1 tab by mouth daily as needed allergies 6)  Famotidine 20 Mg Tabs (Famotidine) .... 2 tabs by mouth at bedtime  Other Orders: Pneumococcal Vaccine (52841) Admin 1st Vaccine (32440) Admin 1st Vaccine [facility]) [phone])  Patient  Instructions: 1)  Schedule for fasting labs:  CMET, A1C, FLP 2)  Follow up with Dr. Delrae Alfred in 6 months  Prescriptions: FEXOFENADINE HCL 180 MG TABS (FEXOFENADINE HCL) 1 tab by mouth daily as needed allergies  #30 x 11   Entered and Authorized by:   Julieanne Manson MD   Signed by:   Julieanne Manson MD on 10/14/2009   Method used:   Faxed to ...       [facility] (retail)       [address] [place], [place]  [address]       Ph: [phone]       Fax: [phone]   RxID:   [id number] FAMOTIDINE 20 MG TABS (FAMOTIDINE) 2 tabs by mouth at bedtime  #60 x 11   Entered and Authorized by:   Julieanne Manson MD   Signed by:   Julieanne Manson MD on 10/14/2009   Method used:   Faxed to ...       [facility] - [facility] (retail)       [address] [place], [place]  [address]       Ph: [phone]       Fax: [phone]   RxID:   [id number] LOVAZA 1 GM CAPS (OMEGA-3-ACID ETHYL ESTERS) 4 caps by mouth daily  #120 x 11   Entered and Authorized by:   Julieanne Manson MD   Signed by:   Julieanne Manson MD on 10/14/2009   Method used:   Faxed to ...       [facility] - [facility] (retail)       [address] [place], [place]  [address]       Ph: [phone]       Fax: [phone]   RxID:   [id number] FLUTICASONE PROPIONATE 50 MCG/ACT SUSP (FLUTICASONE PROPIONATE) 2 sprays each nostril daily  #1 x 11   Entered and Authorized by:   Julieanne Manson MD   Signed by:   Julieanne Manson MD on 10/14/2009   Method used:   Faxed to ...       [facility] - [facility] (retail)       [address] [place], [place]  [address]       Ph: [phone]       Fax: [phone]   RxID:   [id number] LISINOPRIL-HYDROCHLOROTHIAZIDE 20-12.5 MG  TABS (LISINOPRIL-HYDROCHLOROTHIAZIDE) 1 tab by mouth daily in a.m.  #30 Each x  3   Entered and Authorized by:   Julieanne Manson MD   Signed by:   Julieanne Manson MD on 10/14/2009   Method used:   Faxed to ...       [facility] - [facility] (retail)       [address] [place], [place]  [address]       Ph: [phone]       Fax: [phone]   RxID:   [id number]    Preventive Care Screening  Prior Values:  PSA:  0.35 (10/07/2008)    Last Tetanus Booster:  Historical (03/30/2005)    Last Flu Shot:  Fluvax 3+ (11/09/2008)     Immunizations:  no documentation of pneumovax in record--may have had one, but not clear. Colonoscopy:  2 months ago--to return in 5 years.  [facility]. Guaiac Cards:  just had colonoscopy 2 months ago. STE:  No change in exam--has history of epididymal cyst and hydrocele.   Laboratory Results   Urine Tests    Routine Urinalysis   Color: yellow Appearance: Clear Glucose: negative   (Normal Range: Negative) Bilirubin: negative   (Normal Range: Negative) Ketone: negative   (Normal Range: Negative) Spec. Gravity: 1.020   (Normal Range: 1.003-1.035) Blood: negative   (Normal Range: Negative) pH: 7.0   (Normal Range: 5.0-8.0) Protein: negative   (Normal Range: Negative) Urobilinogen: 1.0   (Normal Range: 0-1) Nitrite: negative   (Normal Range: Negative) Leukocyte Esterace: negative   (Normal Range: Negative)        Pneumovax Vaccine    Vaccine Type: Pneumovax    Site: right deltoid    Mfr: Merck    Dose: 0.5 ml    Route: IM    Given by: Michelle Nasuti    Exp. Date: 02/20/2011    Lot #: [id number]    VIS given: 08/28/95 version given October 18, 2009.

## 2010-03-01 NOTE — Progress Notes (Signed)
  Phone Note Outgoing Call   Summary of Call: Debra:  Hematology referral for mild chronic thrombocytopenia.  would send all CBCS (flowsheet)  HIV, Hep C results, most recent CMET, LDh and haptoglobin.  Letter and order as well Initial call taken by: Julieanne Manson MD,  Jun 27, 2009 6:15 PM

## 2010-03-01 NOTE — Miscellaneous (Signed)
Summary: Retasure Documentation:  8/111/10  Clinical Lists Changes  Observations: Added new observation of DIAB EYE EX: Retasure:  No retinopathy (09/14/2008 14:25)

## 2010-03-01 NOTE — Progress Notes (Signed)
Summary: Needs refill of Lisinopril-HCTZ  Phone Note Outgoing Call   Summary of Call: Do you want his lisinopril-hctz refilled?  Last visit was 03/2009. Initial call taken by: Dutch Quint RN,  September 15, 2009 10:44 AM  Follow-up for Phone Call        ok to refill Follow-up by: Lehman Prom FNP,  September 15, 2009 11:08 AM  Additional Follow-up for Phone Call Additional follow up Details #1::        Noted.  Dutch Quint RN  September 15, 2009 11:15 AM

## 2010-03-01 NOTE — Letter (Signed)
Summary: Lipid Letter  [facility]  [address] [place], [place] [address]   Phone: [phone]  Fax: [phone]    11/08/2009  [facility] [address] [place], [place]  [address]  Dear Ricky Garrett:  We have carefully reviewed your last lipid profile from 11/03/2009 and the results are noted below with a summary of recommendations for lipid management.    Cholesterol:       183     Goal: <200   HDL "good" Cholesterol:   38     Goal: >45   LDL "bad" Cholesterol:   109     Goal: <100   Triglycerides:       179     Goal: <150    Cholesterol almost at goal--continue to eat well and stay physically active.  Your sugar is still a bit high, but not in diabetic range yet.  Again, keep working on diet and exercise.    TLC Diet (Therapeutic Lifestyle Change): Saturated Fats & Transfatty acids should be kept < 7% of total calories ***Reduce Saturated Fats Polyunstaurated Fat can be up to 10% of total calories Monounsaturated Fat Fat can be up to 20% of total calories Total Fat should be no greater than 25-35% of total calories Carbohydrates should be 50-60% of total calories Protein should be approximately 15% of total calories Fiber should be at least 20-30 grams a day ***Increased fiber may help lower LDL Total Cholesterol should be < 200mg /day Consider adding plant stanol/sterols to diet (example: Benacol spread) ***A higher intake of unsaturated fat may reduce Triglycerides and Increase HDL    Adjunctive Measures (may lower LIPIDS and reduce risk of Heart Attack) include: Aerobic Exercise (20-30 minutes 3-4 times a week) Limit Alcohol Consumption Weight Reduction Aspirin 75-81 mg a day by mouth (if not allergic or contraindicated) Dietary Fiber 20-30 grams a day by mouth     Current Medications: 1)    Lisinopril-hydrochlorothiazide 20-12.5 Mg  Tabs (Lisinopril-hydrochlorothiazide) .Marland Kitchen.. 1 tab by mouth daily in a.m. 2)    Fluticasone  Propionate 50 Mcg/act Susp (Fluticasone propionate) .... 2 sprays each nostril daily 3)    Lovaza 1 Gm Caps (Omega-3-acid ethyl esters) .... 4 caps by mouth daily 4)    Aspir-low 81 Mg Tbec (Aspirin) .Marland Kitchen.. 1 by mouth once daily 5)    Fexofenadine Hcl 180 Mg Tabs (Fexofenadine hcl) .Marland Kitchen.. 1 tab by mouth daily as needed allergies 6)    Famotidine 20 Mg Tabs (Famotidine) .... 2 tabs by mouth at bedtime  If you have any questions, please call. We appreciate being able to work with you.   Sincerely,    [facility] Julieanne Manson MD

## 2010-03-01 NOTE — Letter (Signed)
[facility]   Imported By: Arta Bruce 08/13/2009 12:36:51  _____________________________________________________________________  External Attachment:    Type:   Image     Comment:   External Document

## 2010-03-01 NOTE — Progress Notes (Signed)
Summary: Office Visit//DEPRESSION SCREENING  Office Visit//DEPRESSION SCREENING   Imported By: Arta Bruce 10/18/2009 16:46:43  _____________________________________________________________________  External Attachment:    Type:   Image     Comment:   External Document

## 2010-03-01 NOTE — Progress Notes (Signed)
Summary: Test Results & BP MED  Phone Note Call from Patient   Caller: Daughter Reason for Call: Talk to Doctor, Lab or Test Results Summary of Call: HI  I would like to know if my Dad can stop taking lisinopril because his bp is low . Also my dad had a Retasure test last year and i would like to know the results . you can call me @ [phone]  Thank you for you attention   and have a nice day  Initial call taken by: Cheryll Dessert,  April 19, 2009 4:38 PM  Follow-up for Phone Call        left msg for Arna Medici to call me. Follow-up by: Vesta Mixer CMA,  April 20, 2009 2:17 PM  Additional Follow-up for Phone Call Additional follow up Details #1::        Spoke with Arna Medici, she said her dad's bp was low at [facility], but not sure what the actual numbers have been, he is not feeling dizzy.  He has an appt here next Thursday.  Asked her to write bp results down and let us know if he checks again other continue meds as prescribed until seen next week. Additional Follow-up by: Vesta Mixer CMA,  April 20, 2009 3:47 PM    Additional Follow-up for Phone Call Additional follow up Details #2::    Do they have another way of checking his BP?  The drugstore bp monitors are notoriously inaccurate.  Julieanne Manson MD  April 22, 2009 8:22 AM   Additional Follow-up for Phone Call Additional follow up Details #3:: Details for Additional Follow-up Action Taken: No they don't, but Arna Medici said she could go buy a monitor if you think that will be best..... Tiffany McCoy CMA  April 22, 2009 12:51 PM  No--not necessary, unless he starts having symptoms.   I will see if can find Retasure results--not in chart currently.  Julieanne Manson MD  April 26, 2009 3:21 PM   Left msg for Arna Medici with above info. Additional Follow-up by: Vesta Mixer CMA,  April 27, 2009 12:47 PM

## 2010-03-01 NOTE — Letter (Signed)
Summary: [facility]  [facility]   Imported By: Arta Bruce 04/30/2009 11:06:39  _____________________________________________________________________  External Attachment:    Type:   Image     Comment:   External Document

## 2010-03-01 NOTE — Progress Notes (Signed)
Summary: Referral information  Phone Note Other Incoming   Caller: Rene Kocher Summary of Call: Reece Levy can you follow up on this referral for GI? Initial call taken by: Mikey College CMA,  April 08, 2009 10:26 AM  Follow-up for Phone Call        I got the referral today & it was faxed today to [facility] today.They will contact him with an appt.Let me know if I could be of further assistance Follow-up by: Candi Leash,  April 08, 2009 12:04 PM

## 2010-03-01 NOTE — Letter (Signed)
Summary: Lipid Letter  [facility]  [address] [place], [place] [address]   Phone: [phone]  Fax: [phone]    04/03/2009  [facility] [address] [place], [place]  [address]  Dear Ricky Garrett:  We have carefully reviewed your last lipid profile from 03/18/2009 and the results are noted below with a summary of recommendations for lipid management.    Cholesterol:       192     Goal: <200   HDL "good" Cholesterol:   37     Goal: >45   LDL "bad" Cholesterol:   133     Goal: <100   Triglycerides:       112     Goal: <150    Your cholesterol is okay, though would like to see the HDL up a bit more and LDL down a bit more.  It is defintely higher than last check--are you still taking the Lovaza?  Are the meals you are eating as healthy as before?  I know you are eating less, but would like to make sure that it is healthy.  We can recheck this again later in the next year.  Tiffany will be giving you a call to recheck your platelet count, which is mildly low--though this has been a finding since I started seeing you--it has not worsened with time and is quite mild.  Platelets help you scab and clot when you cut yourself.    TLC Diet (Therapeutic Lifestyle Change): Saturated Fats & Transfatty acids should be kept < 7% of total calories ***Reduce Saturated Fats Polyunstaurated Fat can be up to 10% of total calories Monounsaturated Fat Fat can be up to 20% of total calories Total Fat should be no greater than 25-35% of total calories Carbohydrates should be 50-60% of total calories Protein should be approximately 15% of total calories Fiber should be at least 20-30 grams a day ***Increased fiber may help lower LDL Total Cholesterol should be < 200mg /day Consider adding plant stanol/sterols to diet (example: Benacol spread) ***A higher intake of unsaturated fat may reduce Triglycerides and Increase HDL    Adjunctive Measures (may lower LIPIDS and reduce risk of Heart  Attack) include: Aerobic Exercise (20-30 minutes 3-4 times a week) Limit Alcohol Consumption Weight Reduction Aspirin 75-81 mg a day by mouth (if not allergic or contraindicated) Dietary Fiber 20-30 grams a day by mouth     Current Medications: 1)    Lisinopril-hydrochlorothiazide 20-12.5 Mg  Tabs (Lisinopril-hydrochlorothiazide) .Marland Kitchen.. 1 tab by mouth daily in a.m. 2)    Nasacort Aq 55 Mcg/act  Aers (Triamcinolone acetonide(nasal)) .... 2 sprays each nostril daily 3)    Lovaza 1 Gm Caps (Omega-3-acid ethyl esters) .... 4 caps by mouth daily 4)    Aspir-low 81 Mg Tbec (Aspirin) .Marland Kitchen.. 1 by mouth once daily 5)    Fexofenadine Hcl 180 Mg Tabs (Fexofenadine hcl) .Marland Kitchen.. 1 tab by mouth daily as needed allergies 6)    Famotidine 20 Mg Tabs (Famotidine) .... 2 tabs by mouth at bedtime  If you have any questions, please call. We appreciate being able to work with you.   Sincerely,    [facility] Julieanne Manson MD

## 2010-03-01 NOTE — Miscellaneous (Signed)
Summary: Hematology update  Clinical Lists Changes  Problems: Changed problem from THROMBOCYTOPENIA (ICD-287.5) to THROMBOCYTOPENIA (ICD-287.5) - Probable ITP per Dr. Gaylyn Rong, Hematology q 3 month CBC at [facility] If develops pancytopenia or Platelets less than 80,000, consider bone marrow biopsy

## 2010-03-01 NOTE — Progress Notes (Signed)
Summary: GI referral update  Phone Note From Other Clinic   Caller: Referral Coordinator Summary of Call: Pt was seen 03-26-09 by Dr.Outlaw @ [facility] and had colon on 04-08-09 Initial call taken by: Candi Leash,  April 20, 2009 11:08 AM

## 2010-03-01 NOTE — Assessment & Plan Note (Signed)
Summary: F/U 6 MONTHS HTN , SUGARS,LOSING WEIGHT & COUGH 3WEEKS / NS   Vital Signs:  Patient profile:   70 year old male Weight:      151 pounds BMI:     25.22 Temp:     96.9 degrees F Pulse rate:   76 / minute Pulse rhythm:   regular Resp:     18 per minute BP sitting:   108 / 78  (left arm) Cuff size:   regular  Vitals Entered By: Vesta Mixer CMA (March 18, 2009 9:09 AM) CC: 6 month f/u.  Pt is fasting. Is Patient Diabetic? No Pain Assessment Patient in pain? no       Does patient need assistance? Ambulation Normal   CC:  6 month f/u.  Pt is fasting.Marland Kitchen  History of Present Illness: 1.  Weigh Loss:  has not been working on weight loss.  Daughter, Arna Medici, here with him.  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  She feels he is not drinking much water.  He seems to have a good appetite.  He is limiting what he eats at work--afraid to take too long of a break at work.  Works 9 hour days--2nd shift.  Hx of PUD per pt. today--Describes an EGD in 2005 in [place].  Describes epigastric pain maybe once monthly lasts about 10 minutes.  Never takes anything.  No melena or hematochezia.  Was referred for screening colonoscopy in August, but pt. never heard anything about it.  Does not appear that it was followed through in EMR.  Guaiac cards were negative x3 with CPE earlier this year.  2.  Cough for more than 3 weeks--started with cold symptoms, runny nose.  Cough productive of white to yellow mucous.  Still with a runny nose.  Having a tickle in his throat and has paroxysms of cough.  No shortness of breath.  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 Tobacco Use:   never Alchohol Use:  never Drug Use:  never   Impression & Recommendations:  Problem # 1:  WEIGHT LOSS, ABNORMAL (ICD-783.21) Suspect secondary to decreased eating at work and increased physical activity. Actually at a better weight now with hyperglycemia and htn. Orders: T-Comprehensive Metabolic Panel [phone]) T-CBC w/Diff [phone]) T-TSH [phone])  Problem # 2:  ALLERGIC RHINITIS (ICD-477.9) Add Fexofenadine His updated medication list for this problem includes:    Nasacort Aq 55 Mcg/act Aers (Triamcinolone acetonide(nasal)) .Marland Kitchen... 2 sprays each nostril daily    Fexofenadine Hcl 180 Mg Tabs (Fexofenadine hcl) .Marland Kitchen... 1 tab by mouth daily as needed allergies  Problem # 3:  DYSLIPIDEMIA (ICD-272.4)  His updated medication list for this problem includes:    Lovaza 1 Gm Caps (Omega-3-acid ethyl esters) .Marland KitchenMarland KitchenMarland KitchenMarland Kitchen 4 caps by mouth daily  Orders: T-Lipid Profile ([id number])  Problem # 4:  PUD (ICD-533.90)  His updated medication list for this problem includes:    Famotidine 20 Mg Tabs (Famotidine) .Marland Kitchen... 2 tabs by mouth at bedtime  Complete Medication List: 1)  Lisinopril-hydrochlorothiazide 20-12.5 Mg Tabs (Lisinopril-hydrochlorothiazide) .Marland Kitchen.. 1 tab by mouth daily in a.m. 2)  Nasacort Aq 55 Mcg/act Aers (Triamcinolone acetonide(nasal)) .... 2 sprays each nostril daily 3)  Lovaza 1 Gm Caps (Omega-3-acid ethyl esters) .Marland KitchenMarland KitchenMarland Kitchen  4 caps by mouth daily 4)  Aspir-low 81 Mg Tbec (Aspirin) .Marland Kitchen.. 1 by mouth once daily 5)  Fexofenadine Hcl 180 Mg Tabs (Fexofenadine hcl) .Marland Kitchen.. 1 tab by mouth daily as needed allergies 6)  Famotidine 20 Mg Tabs (Famotidine) .... 2 tabs by mouth at bedtime  Patient Instructions: 1)  Call in one month if still with nasal drainage and congestion or cough 2)  Follow up for weight check--nurse visit. Prescriptions: FAMOTIDINE 20 MG TABS (FAMOTIDINE) 2 tabs by mouth at bedtime  #60 x 11   Entered and Authorized by:   Julieanne Manson MD   Signed by:    Julieanne Manson MD on 03/18/2009   Method used:   Electronically to        [facility] [phone]* (retail)       [address]       [place], [place]  [address]       Ph: [phone]       Fax: [phone]   RxID:   [id number] FEXOFENADINE HCL 180 MG TABS (FEXOFENADINE HCL) 1 tab by mouth daily as needed allergies  #30 x 11   Entered and Authorized by:   Julieanne Manson MD   Signed by:   Julieanne Manson MD on 03/18/2009   Method used:   Faxed to ...       [facility] (retail)       [address] [place], [place]  [address]       Ph: [phone]       Fax: [phone]   RxID:   [id number]

## 2010-03-01 NOTE — Letter (Signed)
Summary: [facility]NEW PT  [facility]NEW PT   Imported By: Arta Bruce 08/13/2009 10:20:24  _____________________________________________________________________  External Attachment:    Type:   Image     Comment:   External Document

## 2010-03-01 NOTE — Letter (Signed)
Summary: *HSN Results Follow up  [facility]  [address] [place], [place] [address]   Phone: [phone]  Fax: [phone]      09/16/2009   Ricky Garrett [address] [place], [place]  [address]   Dear  [name],                            ____S.Drinkard,FNP   ____D. Gore,FNP       ____B. McPherson,MD   ____V. Rankins,MD    __X__E. Mulberry,MD    ____N. Daphine Deutscher, FNP  ____D. Reche Dixon, MD    ____K. Philipp Deputy, MD    ____Other     This letter is to inform you that your recent test(s):  _______Pap Smear    _______Lab Test     _______X-ray    _______ is within acceptable limits  _______ requires a medication change  _______ requires a follow-up lab visit  _______ requires a follow-up visit with your provider   Comments:  Platelet count stable.   Faxed results to Dr. Gaylyn Rong       _________________________________________________________ If you have any questions, please contact our office                     Sincerely,  Julieanne Manson MD [facility]

## 2010-03-12 ENCOUNTER — Encounter (INDEPENDENT_AMBULATORY_CARE_PROVIDER_SITE_OTHER): Payer: Self-pay | Admitting: Internal Medicine

## 2010-03-17 NOTE — Letter (Signed)
Summary: *HSN Results Follow up  [facility]  [address] [place], [place] [address]   Phone: [phone]  Fax: [phone]      03/12/2010   Ricky Garrett [address] [place], [place]  [address]   Dear  Mr. Ricky Garrett,                            ____S.Drinkard,FNP   ____D. Gore,FNP       ____B. McPherson,MD   ____V. Rankins,MD    __X__E. Alem Fahl,MD    ____N. Daphine Deutscher, FNP  ____D. Reche Dixon, MD    ____K. Philipp Deputy, MD    ____Other     This letter is to inform you that your recent test(s):  _______Pap Smear    ____X__Lab Test     _______X-ray    _______ is within acceptable limits  _______ requires a medication change  _______ requires a follow-up lab visit  _______ requires a follow-up visit with your provider   Comments:  Your platelet count is stable--sorry for the delay in a letter.       _________________________________________________________ If you have any questions, please contact our office                     Sincerely,  Julieanne Manson MD [facility]

## 2010-03-28 ENCOUNTER — Encounter (INDEPENDENT_AMBULATORY_CARE_PROVIDER_SITE_OTHER): Payer: Self-pay | Admitting: Internal Medicine

## 2010-03-29 ENCOUNTER — Telehealth (INDEPENDENT_AMBULATORY_CARE_PROVIDER_SITE_OTHER): Payer: Self-pay | Admitting: Internal Medicine

## 2010-04-07 NOTE — Miscellaneous (Signed)
Summary: Colonoscopy to flow sheet.  Clinical Lists Changes  Observations: Added new observation of COLONNXTDUE: 04/09/2014 (04/08/2009 9:23) Added new observation of COLONOSCOPY: Dr. Hinda Lenis GI:  Mild to moderate pancolonoci diverticulosis, small cecal polyp--path returned as tubular adenoma, no high grade dysplasia or malignancy.  Repeat colonoscopy in 5 years. (04/08/2009 9:23)

## 2010-04-12 NOTE — Letter (Signed)
Summary: [facility]//OFFICE VISIT  [facility]//OFFICE VISIT   Imported By: Arta Bruce 04/04/2010 09:14:38  _____________________________________________________________________  External Attachment:    Type:   Image     Comment:   External Document

## 2010-04-12 NOTE — Op Note (Signed)
Summary: Operative Report  Operative Report   Imported By: Arta Bruce 04/04/2010 09:13:08  _____________________________________________________________________  External Attachment:    Type:   Image     Comment:   External Document

## 2010-04-19 NOTE — Progress Notes (Signed)
Summary: RX REFILL 3 MONTHS  PT OUT OF TOWN   Phone Note Call from Patient   Caller: Daughter Summary of Call: DR Maylie Ashton PT HE IS GOING TO BE OUT OF TOWN PER 3 MONTHS AND HE NEED THE RX REFILL OF LOVAZA & [facility] & LISINOPRIL [facility] [address]  THANK YOU  Initial call taken by: Cheryll Dessert,  March 29, 2010 5:26 PM  Follow-up for Phone Call        Last seen 09/2009.  Refill requested meds x90 days?  Dutch Quint RN  March 31, 2010 5:14 PM   Additional Follow-up for Phone Call Additional follow up Details #1::        Please see wife's note for same question. Lisinopril is not a concern, the others are. Additional Follow-up by: Julieanne Manson MD,  March 31, 2010 6:10 PM    Additional Follow-up for Phone Call Additional follow up Details #2::    Lovaza 90-days = $684.36 Fluticasone spray 90-days = $130.54 lisinopril-HCTZ 90-days - $10.00  Dutch Quint RN  April 04, 2010 3:17 PM  Let me know what they want to do. He could pick up Fish oil capsules and take 2000 mg two times a day if he would like to substitute for the Lovaza for 3 months--would be similar--that is otc  Julieanne Manson MD  April 06, 2010 8:53 AM   [facility] interpreter, explained to pt. re medications and costs involved and substitution for lovaza while away.  Verbalized understanding and agreement.  Refills for fluticasone and lisinopril-HCTZ for 90 days sent to [facility] on [address] per pt. request.   Dutch Quint RN  April 08, 2010 3:49 PM   Prescriptions: FLUTICASONE PROPIONATE 50 MCG/ACT SUSP (FLUTICASONE PROPIONATE) 2 sprays each nostril daily  #3 x 0   Entered by:   Dutch Quint RN   Authorized by:   Julieanne Manson MD   Signed by:   Julieanne Manson MD on 04/11/2010   Method used:   Electronically to        [facility] [phone]* (retail)       [address]       [place], [place]  [address]       Ph: [phone]       Fax: [phone]   RxID:    [id number] LISINOPRIL-HYDROCHLOROTHIAZIDE 20-12.5 MG  TABS (LISINOPRIL-HYDROCHLOROTHIAZIDE) 1 tab by mouth daily in a.m.  #90 x 0   Entered by:   Dutch Quint RN   Authorized by:   Julieanne Manson MD   Signed by:   Julieanne Manson MD on 04/11/2010   Method used:   Electronically to        [facility] [phone]* (retail)       [address]       [place], [place]  [address]       Ph: [phone]       Fax: [phone]   RxID:   [id number]

## 2010-04-20 ENCOUNTER — Encounter: Payer: Self-pay | Admitting: Internal Medicine

## 2010-04-20 ENCOUNTER — Encounter (INDEPENDENT_AMBULATORY_CARE_PROVIDER_SITE_OTHER): Payer: Self-pay | Admitting: Internal Medicine

## 2010-04-28 ENCOUNTER — Encounter (INDEPENDENT_AMBULATORY_CARE_PROVIDER_SITE_OTHER): Payer: Self-pay | Admitting: Internal Medicine

## 2010-04-28 NOTE — Assessment & Plan Note (Signed)
Summary: 6 month f/u    Vital Signs:  Patient profile:   70 year old male Weight:      167.38 pounds Temp:     97.8 degrees F oral Pulse rate:   58 / minute Pulse rhythm:   regular Resp:     20 per minute BP sitting:   129 / 72  (left arm) Cuff size:   regular  Vitals Entered By: Hale Drone CMA (April 20, 2010 9:31 AM) CC: 6 month f/u from 10/15/10.  Is Patient Diabetic? No Pain Assessment Patient in pain? no       Does patient need assistance? Functional Status Self care Ambulation Normal   CC:  6 month f/u from 10/15/10. Marland Kitchen  History of Present Illness: Here for 6 month follow up.  No concerns  2.  Hypertension:  has been controlled  3.  Hyperlipidemia:  fasting today --was almost at goal last October, 2011.  Has been physically active and working on diet.  4.  Hyperglycemia: Borderline with Hgb A1C at 5.9% previously.  Working on diet and exercise as  above.  Current Medications (verified): 1)  Lisinopril-Hydrochlorothiazide 20-12.5 Mg  Tabs (Lisinopril-Hydrochlorothiazide) .Marland Kitchen.. 1 Tab By Mouth Daily in A.m. 2)  Fluticasone Propionate 50 Mcg/act Susp (Fluticasone Propionate) .... 2 Sprays Each Nostril Daily 3)  Lovaza 1 Gm Caps (Omega-3-Acid Ethyl Esters) .... 4 Caps By Mouth Daily 4)  Aspir-Low 81 Mg Tbec (Aspirin) .Marland Kitchen.. 1 By Mouth Once Daily 5)  Fexofenadine Hcl 180 Mg Tabs (Fexofenadine Hcl) .Marland Kitchen.. 1 Tab By Mouth Daily As Needed Allergies 6)  Famotidine 20 Mg Tabs (Famotidine) .... 2 Tabs By Mouth At Bedtime  Allergies (verified): No Known Drug Allergies  Physical Exam  General:  NAD Lungs:  Normal respiratory effort, chest expands symmetrically. Lungs are clear to auscultation, no crackles or wheezes. Heart:  Normal rate and regular rhythm. S1 and S2 normal without gallop, murmur, click, rub or other extra sounds.  Radial pulses normal and equal bilaterally Extremities:  No edema   Complete Medication List: 1)  Lisinopril-hydrochlorothiazide 20-12.5 Mg Tabs  (Lisinopril-hydrochlorothiazide) .Marland Kitchen.. 1 tab by mouth daily in a.m. 2)  Fluticasone Propionate 50 Mcg/act Susp (Fluticasone propionate) .... 2 sprays each nostril daily 3)  Lovaza 1 Gm Caps (Omega-3-acid ethyl esters) .... 4 caps by mouth daily 4)  Aspir-low 81 Mg Tbec (Aspirin) .Marland Kitchen.. 1 by mouth once daily 5)  Fexofenadine Hcl 180 Mg Tabs (Fexofenadine hcl) .Marland Kitchen.. 1 tab by mouth daily as needed allergies 6)  Famotidine 20 Mg Tabs (Famotidine) .... 2 tabs by mouth at bedtime  Other Orders: T-Lipid Profile ([id number]) Capillary Blood Glucose/CBG (09811) Hgb A1C (91478GN)  Patient Instructions: 1)  Call for CPE for on or after 10/15/10 with Dr. Delrae Alfred   Orders Added: 1)  Est. Patient Level III [[address]213] 2)  T-Lipid Profile [[id number]] 3)  Capillary Blood Glucose/CBG [82948] 4)  Hgb A1C [08657QI]

## 2010-05-03 NOTE — Medication Information (Signed)
Summary: RX Folder//PRESCRIPTION VERIFICATION FORM  RX Folder//PRESCRIPTION VERIFICATION FORM   Imported By: Arta Bruce 04/28/2010 15:14:45  _____________________________________________________________________  External Attachment:    Type:   Image     Comment:   External Document

## 2010-07-20 ENCOUNTER — Encounter (HOSPITAL_BASED_OUTPATIENT_CLINIC_OR_DEPARTMENT_OTHER): Payer: Self-pay | Admitting: Oncology

## 2010-07-20 ENCOUNTER — Other Ambulatory Visit: Payer: Self-pay | Admitting: Oncology

## 2010-07-20 DIAGNOSIS — D696 Thrombocytopenia, unspecified: Secondary | ICD-10-CM

## 2010-07-20 DIAGNOSIS — E785 Hyperlipidemia, unspecified: Secondary | ICD-10-CM

## 2010-07-20 DIAGNOSIS — I10 Essential (primary) hypertension: Secondary | ICD-10-CM

## 2010-07-20 LAB — CBC WITH DIFFERENTIAL/PLATELET
BASO%: 0.5 % (ref 0.0–2.0)
LYMPH%: 34.5 % (ref 14.0–49.0)
MCHC: 34 g/dL (ref 32.0–36.0)
MONO#: 0.3 10*3/uL (ref 0.1–0.9)
RBC: 4.8 10*6/uL (ref 4.20–5.82)
WBC: 4.1 10*3/uL (ref 4.0–10.3)
lymph#: 1.4 10*3/uL (ref 0.9–3.3)

## 2010-07-20 LAB — COMPREHENSIVE METABOLIC PANEL
ALT: 23 U/L (ref 0–53)
AST: 21 U/L (ref 0–37)
CO2: 29 mEq/L (ref 19–32)
Chloride: 102 mEq/L (ref 96–112)
Sodium: 138 mEq/L (ref 135–145)
Total Bilirubin: 0.7 mg/dL (ref 0.3–1.2)
Total Protein: 6.4 g/dL (ref 6.0–8.3)

## 2010-07-20 LAB — LACTATE DEHYDROGENASE: LDH: 146 U/L (ref 94–250)

## 2010-11-14 ENCOUNTER — Encounter: Payer: Self-pay | Admitting: Family Medicine

## 2010-11-14 ENCOUNTER — Other Ambulatory Visit: Payer: Self-pay | Admitting: Oncology

## 2010-11-14 ENCOUNTER — Ambulatory Visit (INDEPENDENT_AMBULATORY_CARE_PROVIDER_SITE_OTHER): Payer: Medicare Other | Admitting: Family Medicine

## 2010-11-14 ENCOUNTER — Encounter (HOSPITAL_BASED_OUTPATIENT_CLINIC_OR_DEPARTMENT_OTHER): Payer: Medicare Other | Admitting: Oncology

## 2010-11-14 VITALS — BP 135/81 | HR 58 | Ht 65.5 in | Wt 166.9 lb

## 2010-11-14 DIAGNOSIS — E785 Hyperlipidemia, unspecified: Secondary | ICD-10-CM

## 2010-11-14 DIAGNOSIS — I10 Essential (primary) hypertension: Secondary | ICD-10-CM

## 2010-11-14 DIAGNOSIS — D696 Thrombocytopenia, unspecified: Secondary | ICD-10-CM

## 2010-11-14 DIAGNOSIS — Z23 Encounter for immunization: Secondary | ICD-10-CM

## 2010-11-14 DIAGNOSIS — J309 Allergic rhinitis, unspecified: Secondary | ICD-10-CM

## 2010-11-14 LAB — CBC WITH DIFFERENTIAL/PLATELET
BASO%: 1.3 % (ref 0.0–2.0)
EOS%: 1.4 % (ref 0.0–7.0)
HCT: 44.2 % (ref 38.4–49.9)
LYMPH%: 33.2 % (ref 14.0–49.0)
MCH: 31.9 pg (ref 27.2–33.4)
MCHC: 34.3 g/dL (ref 32.0–36.0)
MCV: 92.9 fL (ref 79.3–98.0)
MONO%: 8.3 % (ref 0.0–14.0)
NEUT%: 55.8 % (ref 39.0–75.0)
lymph#: 1.8 10*3/uL (ref 0.9–3.3)

## 2010-11-14 LAB — COMPREHENSIVE METABOLIC PANEL
ALT: 20 U/L (ref 0–53)
AST: 21 U/L (ref 0–37)
Albumin: 4.3 g/dL (ref 3.5–5.2)
Calcium: 9.4 mg/dL (ref 8.4–10.5)
Chloride: 101 mEq/L (ref 96–112)
Potassium: 4 mEq/L (ref 3.5–5.3)
Total Protein: 6.9 g/dL (ref 6.0–8.3)

## 2010-11-14 MED ORDER — FLUTICASONE PROPIONATE 50 MCG/ACT NA SUSP: 1.0000 | Freq: Every day | NASAL | Status: DC

## 2010-11-14 MED ORDER — OMEGA-3-ACID ETHYL ESTERS 1 G PO CAPS: 2.0000 g | ORAL_CAPSULE | Freq: Two times a day (BID) | ORAL | Status: DC

## 2010-11-14 MED ORDER — LISINOPRIL-HYDROCHLOROTHIAZIDE 20-12.5 MG PO TABS: 1.0000 | ORAL_TABLET | Freq: Every day | ORAL | Status: DC

## 2010-11-14 NOTE — Progress Notes (Signed)
[facility], presents to clinic today to meet the new doctor and to establish care. He previously [facility] but is switching care here. He has no complaints today.  He notes hypertension, hyperlipidemia, and nasal discharge. Medications reviewed and added. Past medical history family history past surgical history and social history is reviewed and added.  Review of systems significant only for a foul smell sometimes when he sneezes otherwise normal.  Exam:  BP 135/81  Pulse 58  Ht 5' 5.5" (1.664 m)  Wt 166 lb 14.4 oz (75.705 kg)  BMI 27.35 kg/m2 Gen: Well NAD HEENT: EOMI,  MMM Lungs: CTABL Nl WOB Heart: RRR no MRG Abd: NABS, NT, ND Exts: Non edematous BL  LE, warm and well perfused.  Skin: Head and face upper and lower extremities trunk back inspected no concerning malignancies noted. One caf au lait spot on the lower left back.  Interview conducted in [place] with [place] as interpreter

## 2010-11-14 NOTE — Assessment & Plan Note (Signed)
Doing well. Would like refill of Flonase prescription. Plan to refill and follow.

## 2010-11-14 NOTE — Assessment & Plan Note (Signed)
Doing well on omega-3 fatty acid.  Plan to refill prescription. Followup in 6 months and check fasting lipids at that point.

## 2010-11-14 NOTE — Patient Instructions (Addendum)
Gracias por venir.  Usted esta muy sano.  Yo mande su medicinas a [facility].  Regrese antes si tiene problemas. Tome agua en vez de soda. Por favor haga ejercicio 30 minutos diarios 5 dia de la semana. Coma verduras y frutas con cada alimento.  Regrese en seis meses.

## 2010-11-14 NOTE — Assessment & Plan Note (Signed)
Well controlled today. On lisinopril and hydrochlorothiazide. Labs drawn in June. Plan to refill prescription in followup in 6 months

## 2011-02-11 ENCOUNTER — Telehealth: Payer: Self-pay | Admitting: Oncology

## 2011-02-11 NOTE — Telephone Encounter (Signed)
S/w the pt's sister Arna Medici and she is aware of the feb,april,oct 2013 appts

## 2011-02-13 ENCOUNTER — Other Ambulatory Visit: Payer: Medicare Other | Admitting: Lab

## 2011-03-13 ENCOUNTER — Other Ambulatory Visit: Payer: Medicare Other

## 2011-03-21 ENCOUNTER — Ambulatory Visit (INDEPENDENT_AMBULATORY_CARE_PROVIDER_SITE_OTHER): Payer: Medicare Other | Admitting: Family Medicine

## 2011-03-21 ENCOUNTER — Encounter: Payer: Self-pay | Admitting: Family Medicine

## 2011-03-21 VITALS — BP 137/79 | HR 64 | Temp 98.3°F | Ht 65.5 in | Wt 168.0 lb

## 2011-03-21 DIAGNOSIS — R7309 Other abnormal glucose: Secondary | ICD-10-CM

## 2011-03-21 DIAGNOSIS — E785 Hyperlipidemia, unspecified: Secondary | ICD-10-CM

## 2011-03-21 DIAGNOSIS — I10 Essential (primary) hypertension: Secondary | ICD-10-CM

## 2011-03-21 DIAGNOSIS — D696 Thrombocytopenia, unspecified: Secondary | ICD-10-CM

## 2011-03-21 NOTE — Patient Instructions (Signed)
Return tomorrow fasting for lab tests  Regrese manana para pruebas de sangre.   Regrese en 6 meses para otro chequeo.

## 2011-03-22 ENCOUNTER — Other Ambulatory Visit: Payer: Self-pay

## 2011-03-22 ENCOUNTER — Encounter: Payer: Self-pay | Admitting: Family Medicine

## 2011-03-22 DIAGNOSIS — D696 Thrombocytopenia, unspecified: Secondary | ICD-10-CM

## 2011-03-22 DIAGNOSIS — E785 Hyperlipidemia, unspecified: Secondary | ICD-10-CM

## 2011-03-22 NOTE — Assessment & Plan Note (Signed)
well controlled  

## 2011-03-22 NOTE — Assessment & Plan Note (Signed)
Recheck fasting BMET

## 2011-03-22 NOTE — Assessment & Plan Note (Signed)
Re-evaluate control on just Lovasa

## 2011-03-22 NOTE — Progress Notes (Signed)
  Subjective:    Patient ID: Ricky Garrett, male    DOB: 1940-07-06, 71 y.o.   MRN: [id number]  HPI Has had URI symptoms for 2 weeks that are improving, but still has cough and congestion  Occasional epigastric burning. Doesn't take antacids. One cup of coffee in the mornings.    Review of Systems  Constitutional: Negative for fever, activity change and appetite change.  HENT: Positive for congestion, rhinorrhea, voice change and postnasal drip. Negative for ear pain.   Respiratory: Positive for cough.   Cardiovascular: Negative for chest pain.  Gastrointestinal: Negative for vomiting.       Objective:   Physical Exam  Constitutional: He appears well-developed and well-nourished.  HENT:  Head: Normocephalic.  Right Ear: External ear normal.  Left Ear: External ear normal.  Nose: Nose normal.  Mouth/Throat: Oropharynx is clear and moist. No oropharyngeal exudate.  Neck: No thyromegaly present.  Cardiovascular: Normal rate, regular rhythm and normal heart sounds.   No murmur heard. Pulmonary/Chest: Effort normal and breath sounds normal. He has no wheezes. He has no rales.  Abdominal: Soft. He exhibits no distension and no mass. There is no tenderness. There is no rebound and no guarding.       No splenomegaly  Lymphadenopathy:    He has no cervical adenopathy.  Psychiatric: He has a normal mood and affect.          Assessment & Plan:

## 2011-03-23 ENCOUNTER — Encounter: Payer: Self-pay | Admitting: Family Medicine

## 2011-03-23 LAB — CBC WITH DIFFERENTIAL/PLATELET
Eosinophils Relative: 2 % (ref 0–5)
HCT: 47.7 % (ref 39.0–52.0)
Lymphocytes Relative: 36 % (ref 12–46)
Lymphs Abs: 1.8 10*3/uL (ref 0.7–4.0)
MCV: 92.4 fL (ref 78.0–100.0)
Monocytes Absolute: 0.4 10*3/uL (ref 0.1–1.0)
Platelets: 98 10*3/uL — ABNORMAL LOW (ref 150–400)
RBC: 5.16 MIL/uL (ref 4.22–5.81)
WBC: 5.1 10*3/uL (ref 4.0–10.5)

## 2011-03-24 ENCOUNTER — Other Ambulatory Visit: Payer: Medicare Other | Admitting: Lab

## 2011-03-24 ENCOUNTER — Other Ambulatory Visit: Payer: Medicare Other

## 2011-04-03 ENCOUNTER — Telehealth: Payer: Self-pay | Admitting: *Deleted

## 2011-04-03 NOTE — Telephone Encounter (Signed)
Daughter called to make sure Dr. Gaylyn Rong aware of pt's Platelet count was 98 on 03/22/11.  Labs were done by Dr. Sheffield Slider.  I informed her that I will make Dr. Gaylyn Rong aware of Platelet count.  Informed her this is relatively stable and per Dr. Lodema Pilot last note,  No treatment indicated unless Platelets less than 80 or other lab values also low.  Pt's next lab appt is in June 2013.  Instructed her to call back if pt has any signs of bleeding and we can check labs sooner if needed.  She verbalized understanding.

## 2011-05-15 ENCOUNTER — Other Ambulatory Visit: Payer: Medicare Other | Admitting: Lab

## 2011-06-02 ENCOUNTER — Ambulatory Visit (INDEPENDENT_AMBULATORY_CARE_PROVIDER_SITE_OTHER): Payer: Medicare Other | Admitting: Family Medicine

## 2011-06-02 ENCOUNTER — Encounter: Payer: Self-pay | Admitting: Family Medicine

## 2011-06-02 VITALS — BP 127/83 | HR 60 | Temp 97.8°F | Ht 65.0 in | Wt 166.3 lb

## 2011-06-02 DIAGNOSIS — L989 Disorder of the skin and subcutaneous tissue, unspecified: Secondary | ICD-10-CM

## 2011-06-02 MED ORDER — MUPIROCIN 2 % EX OINT: TOPICAL_OINTMENT | Freq: Three times a day (TID) | CUTANEOUS | Status: AC

## 2011-06-02 MED ORDER — TRIAMCINOLONE ACETONIDE 0.1 % EX CREA: TOPICAL_CREAM | Freq: Two times a day (BID) | CUTANEOUS | Status: DC

## 2011-06-02 NOTE — Patient Instructions (Signed)
Return in 2-3 weeks for a rechek or sooner if needed

## 2011-06-02 NOTE — Progress Notes (Signed)
  Subjective:    Patient ID: Ricky Garrett, male    DOB: 1940-07-08, 71 y.o.   MRN: [id number]  HPI 2-3 weeks of tenderness, pain ,itching behind right ear.  May have scratched it too hard.  Does not remember distinct trauma.  Has not used anything on it.  Has never had similar before.    Review of Systems No fever, chills, ear pain.    Objective:   Physical Exam  See picture: Tender, erythematous.  No pustule.  Larger nodule on right measures 2mm. No telangectasia.  No central necrosis.  Some grey pigmentation in center, no pore.     Assessment & Plan:

## 2011-06-02 NOTE — Assessment & Plan Note (Signed)
Possible irritation from abrasion.  Does not look like epidermal inclusion cyst.  Will rx mupirocin + triamcinolone to help him refrain from scratching.  If no improvement, asked patient follow-up for recheck- may consider referral to derm for evaluation/treatment for possible chondrodermatitis nodularis.

## 2011-06-30 ENCOUNTER — Other Ambulatory Visit (HOSPITAL_BASED_OUTPATIENT_CLINIC_OR_DEPARTMENT_OTHER): Payer: Medicare Other | Admitting: Lab

## 2011-06-30 DIAGNOSIS — D696 Thrombocytopenia, unspecified: Secondary | ICD-10-CM

## 2011-06-30 DIAGNOSIS — I10 Essential (primary) hypertension: Secondary | ICD-10-CM

## 2011-06-30 DIAGNOSIS — E785 Hyperlipidemia, unspecified: Secondary | ICD-10-CM

## 2011-06-30 LAB — CBC WITH DIFFERENTIAL/PLATELET
Basophils Absolute: 0 10*3/uL (ref 0.0–0.1)
EOS%: 2.3 % (ref 0.0–7.0)
HGB: 13.8 g/dL (ref 13.0–17.1)
MCH: 30.5 pg (ref 27.2–33.4)
MCHC: 33.1 g/dL (ref 32.0–36.0)
MCV: 92 fL (ref 79.3–98.0)
MONO%: 9.1 % (ref 0.0–14.0)
RBC: 4.51 10*6/uL (ref 4.20–5.82)
RDW: 12.9 % (ref 11.0–14.6)

## 2011-06-30 LAB — MORPHOLOGY

## 2011-07-10 ENCOUNTER — Ambulatory Visit: Payer: Medicare Other | Admitting: Oncology

## 2011-07-10 ENCOUNTER — Other Ambulatory Visit: Payer: Medicare Other | Admitting: Lab

## 2011-07-13 ENCOUNTER — Telehealth: Payer: Self-pay

## 2011-07-13 NOTE — Telephone Encounter (Signed)
Message copied by Kallie Locks on Thu Jul 13, 2011  4:23 PM ------      Message from: Jethro Bolus T      Created: Fri Jun 30, 2011  3:58 PM       Please call pt.  His thrombocytopenia is stable (most likely ITP immune destruction).  Low risk of spontaneous bleeding.  Continue watchful observation.  Thanks.

## 2011-07-13 NOTE — Telephone Encounter (Signed)
Message copied by Kallie Locks on Thu Jul 13, 2011  4:17 PM ------      Message from: Ricky Garrett      Created: Fri Jun 30, 2011  3:58 PM       Please call pt.  His thrombocytopenia is stable (most likely ITP immune destruction).  Low risk of spontaneous bleeding.  Continue watchful observation.  Thanks.

## 2011-09-12 ENCOUNTER — Ambulatory Visit (INDEPENDENT_AMBULATORY_CARE_PROVIDER_SITE_OTHER): Payer: Medicare Other | Admitting: Family Medicine

## 2011-09-12 ENCOUNTER — Encounter: Payer: Self-pay | Admitting: Family Medicine

## 2011-09-12 VITALS — BP 112/72 | HR 54 | Ht 65.0 in | Wt 165.0 lb

## 2011-09-12 DIAGNOSIS — D696 Thrombocytopenia, unspecified: Secondary | ICD-10-CM

## 2011-09-12 DIAGNOSIS — I10 Essential (primary) hypertension: Secondary | ICD-10-CM

## 2011-09-12 DIAGNOSIS — K2990 Gastroduodenitis, unspecified, without bleeding: Secondary | ICD-10-CM

## 2011-09-12 DIAGNOSIS — H04123 Dry eye syndrome of bilateral lacrimal glands: Secondary | ICD-10-CM | POA: Insufficient documentation

## 2011-09-12 DIAGNOSIS — H04129 Dry eye syndrome of unspecified lacrimal gland: Secondary | ICD-10-CM

## 2011-09-12 DIAGNOSIS — K2970 Gastritis, unspecified, without bleeding: Secondary | ICD-10-CM

## 2011-09-12 NOTE — Patient Instructions (Addendum)
Ask Dr Gaylyn Rong to ask if you should be taking aspirin with your low platelets Try over the counter artificial tears for your dry eyes. See an eye doctor for exam and test for glaucoma  Schedule a fasting lab test and I will check for the stomach bacteria H Pylori at time. I will call with the results.   Gastritis (Gastritis) La gastritis es una inflamacin (reaccin del organismo a una lesin o infeccin) del Teaching laboratory technician. A menudo la causan infecciones virales o bacterianas (grmenes). Otras causas pueden ser ciertas sustancias qumicas (incluyendo el alcohol) y los medicamentos. Esta enfermedad puede estar asociada a un malestar generalizado (sentirse cansado y mal), calambres y [place]. La enfermedad puede durar [employer] y [place]. Si los sntomas de gastritis continan, podr ser necesario someterse a una gastroscopa (observacin del estmago con un instrumento similar a un telescopio), a Physiological scientist biopsia (extraccin de Lauris Poag de tejido), y/o a pruebas de [place]. Los antibiticos no sern de utilidad a menos que la causa sea una infeccin bacteriana. Cuando la causa es [place], puede deberse a un organismo conocido como H. pylori. En este caso puede tratarse con antibiticos. Otras formas de gastritis se deben a una produccin excesiva de cido en el estmago. Esto puede tratarse con [employer] bloqueadores H2 y los anticidos. Generalmente todo lo que se necesita es un Medical laboratory scientific officer. Los nios pequeos podrn deshidratarse (perder los lquidos del organismo) con facilidad si los vmitos continan junto con la diarrea. Pueden administrarse algunos medicamentos que controlarn las nuseas. En general no se prescriben medicamentos para la diarrea, a menos que sea particularmente molesta. Algunos medicamentos retrasan la eliminacin de los virus del tracto gastrointestinal. Este factor hace ms lento el proceso de curacin. INSTRUCCIONES PARA EL CUIDADO DOMICILIARIO Instrucciones para  el cuidado domiciliario para las nuseas y los vmitos:  Para los adultos: beba con frecuencia cantidades pequeas de lquido. Beba al menos 2 litros por da. Beba a sorbos con frecuencia. No beba grandes cantidades de lquido de [employer]. As podr [employer] nuseas.   Utilice los medicamentos de venta libre o de prescripcin para Chief Technology Officer, Environmental health practitioner o la [place], segn se lo indique el profesional que lo asiste.   Beba slo lquidos claros. Son los lquidos transparentes como el agua, [place], u bebidas ligeras.   Una vez que usted [employer] lquidos claros, puede tomar cualquier lquido, sopas, jugos y helados o sorbetes. Agregue lentamente alimentos blandos (sin condimentar) a la dieta.  Instrucciones para el cuidado domiciliario en los [place] de diarrea:  La diarrea puede estar causada por una infeccin bacteriana o un virus. Su problema debe mejorar con el transcurso del [place], reposo, lquidos y/o los medicamentos antidiarreicos.   Hasta que la diarrea est bajo control, usted deber beber lquidos claros en pequeas cantidades, con frecuencia. Los lquidos claros incluyen: agua, caldo, gelatina, y t liviano.  Evite:  Leche.   Nils Pyle.   Tabaco.   Alcohol.   Lquidos muy calientes o fros.   Comer demasiado a Licensed conveyancer.  Cuando la diarrea se Chief Strategy Officer, puede agregar los siguientes alimentos que ayudan a formar las heces:  Surveyor, minerals.   Pltanos.   Manzanas sin cscara.   Tostadas secas.  Una vez que estos alimentos son [place], puede agregar yogur con bajo contenido de grasa y requesn con bajo contenido de  [place]. Estos alimentos harn que recupere el equilibrio de las bacterias intestinales. Lave bien sus manos para evitar que la bacteria o el virus  se diseminen. SOLICITE ATENCIN MDICA DE INMEDIATO SI:  No puede retener lquidos.   Los vmitos o la diarrea se Therapist, music (se repiten una y Laverda Page).   Aparece dolor abdominal, o ste aumenta o se localiza. Un  dolor ubicado hacia el lado derecho del abdomen puede deberse a apendicitis. Un dolor en un adulto ubicado en el lado izquierdo del abdomen puede sugerir una diverticulitis.   Aparece fiebre alta, entendida como una temperatura de ms de 102 F (38.9 C), o la temperatura que le haya indicado el profesional que lo asiste, durante ms de [address].   La diarrea se hace excesiva o contiene sangre o mucosidad.   Presenta debilidad excesiva, mareos, lipotimia o sed extrema.  Document Released: 10/26/2004 Document Revised: 01/05/2011 [facility] Patient Information 2012 [place], [place]. Tomar Omeprazole 20 mg antes de dormir para acidez  Reflujo gastroesofgico - Adultos  (Gastroesophageal Reflux Disease, Adult)  El reflujo gastroesofgico ocurre cuando el cido del estmago pasa al esfago. Cuando el cido entra en contacto con el esfago, el cido provoca dolor (inflamacin) en el esfago. Con el tiempo, pueden formarse pequeos agujeros (lceras) en el revestimiento del esfago. CAUSAS   Exceso de Runner, broadcasting/film/video. Esto aplica presin [employer] and [employer], lo que hace que el cido del estmago suba hacia el esfago.   El hbito de fumar [place] la produccin de cido en el [place].   El consumo de alcohol. Provoca disminucin de la presin en el esfnter esofgico inferior (vlvula o anillo de msculo entre el esfago y Investment banker, corporate), permitiendo que el cido del estmago suba hacia el esfago.   Cenas a ltima hora del da y estmago lleno. Aumenta la presin y la produccin de cido en el estmago.   Malformacin en el esfnter esofgico inferior.  A menudo no se halla causa.  SNTOMAS   Ardor y Radiographer, therapeutic parte inferior del pecho detrs del esternn y en la zona media del [place]. Puede ocurrir [employer] por semana o ms a menudo.   Dificultad para tragar.   Dolor de Advertising copywriter.   Tos seca.   Sntomas similares al asma que incluyen sensacin de opresin en el pecho, falta de aire y  sibilancias.  DIAGNSTICO  El mdico diagnosticar el problema basndose en los sntomas. En algunos casos, se indican radiografas y otras pruebas para verificar si hay complicaciones o para comprobar el estado del [address] y Training and development officer.  TRATAMIENTO  El mdico le indicar medicamentos de venta libre o recetados para ayudar a disminuir la produccin de cido. Consulte con su mdico antes de Corporate investment banker o agregar cualquier medicamento nuevo.  INSTRUCCIONES PARA EL CUIDADO EN EL HOGAR   Modifique los factores que pueda cambiar. Consulte con su mdico para solicitar orientacin relacionada con la prdida de peso, dejar de fumar y el consumo de alcohol.   Evite las comidas y bebidas que [address] [place], [place]:   [place] con cafena o alcohlicas.   Chocolate.   Sabores a Advertising account planner.   Ajo y cebolla.   Comidas muy condimentadas.   Ctricos como naranjas, limones o limas.   Alimentos que contengan tomate, como salsas, [place] y pizza.   Alimentos fritos y Lexicographer.   Evite acostarse durante 3 horas antes de irse a dormir o antes de tomar una siesta.   Haga comidas pequeas durante Glass blower/designer de 3 comidas abundantes.   Use ropas sueltas. No use nada apretado alrededor de la cintura que cause presin en el estmago.  Levante (eleve) la cabecera de la cama 6 a 8 pulgadas (15 a 20 cm) con bloques de madera. Usar almohadas extra no ayuda.   Solo tome medicamentos que se pueden comprar sin receta o recetados para el dolor, Dentist o fiebre, como le indica el mdico.   No tome aspirina, ibuprofeno ni antiinflamatorios no esteroides.  SOLICITE ATENCIN MDICA DE [employer] SI:   [employer], el cuello, la [place], los dientes o la espalda.   El dolor aumenta o cambia la intensidad o la durancin.   Tiene nuseas, vmitos o sudoracin(diaforesis).   Siente falta de aire o dolor en el pecho, o se desmaya.   Vomita y el vmito tiene [place], es de color [place], [place],  negro o es similar a la borra del caf o tiene [place].   Las heces son rojas, sanguinolentas o negras.  Estos sntomas pueden ser signos de [address], como enfermedades cardacas, hemorragias gstrias o sangrado esofgico.  ASEGRESE DE QUE:   Comprende estas instrucciones.   Controlar su enfermedad.   Solicitar ayuda de inmediato si no mejora o si empeora.  Document Released: 10/26/2004 Document Revised: 01/05/2011 [facility] Patient Information 2012 [place], [place].

## 2011-09-13 ENCOUNTER — Encounter: Payer: Self-pay | Admitting: Family Medicine

## 2011-09-13 DIAGNOSIS — K219 Gastro-esophageal reflux disease without esophagitis: Secondary | ICD-10-CM | POA: Insufficient documentation

## 2011-09-13 MED ORDER — LACRISERT 5 MG OP INST: 5.0000 mg | VAGINAL_INSERT | Freq: Every day | OPHTHALMIC | Status: DC

## 2011-09-13 NOTE — Assessment & Plan Note (Signed)
well controlled  

## 2011-09-13 NOTE — Progress Notes (Signed)
  Subjective:    Patient ID: Ricky Garrett, male    DOB: [birth year], 71 y.o.   MRN: [id number]  HPI Epigastric  burning recently that improves briefly with antacids. He was diagnosed in [place] with ulcers on EGD. Doesn't remember treatment with antibiotics.  Protonix has helped in the past but he hasn't recently tried an OTC medication.  No signs of blood in his stools. He does have reflux with bitter taste in his mouth, but no cough or voice changes. One cup of coffee in the AM, no other caffeinated drinks  Dry eyes that burn when he uses Visine. Wears glasses but hasn't seen an eye doctor in 4 years.   Thrombocytopenia - he will follow up with Dr Gaylyn Rong. He takes baby aspirin intermittently.   Interview conducted in Spanish   see HPI for ROS  Review of Systems  HENT: Negative for voice change.   Gastrointestinal: Positive for abdominal pain. Negative for nausea, vomiting, diarrhea, constipation and blood in stool.  Skin: Negative for rash.  Hematological: Does not bruise/bleed easily.       Objective:   Physical Exam  Constitutional: He appears well-developed and well-nourished.  Eyes: Conjunctivae and EOM are normal. Pupils are equal, round, and reactive to light. Right eye exhibits no discharge. Left eye exhibits no discharge.       Fundi normal  Cardiovascular: Normal rate and regular rhythm.   Pulmonary/Chest: Effort normal and breath sounds normal.  Abdominal: Soft. Bowel sounds are normal. He exhibits no distension and no mass. There is no tenderness. There is no rebound and no guarding.  Skin:       No bruising or petechiae          Assessment & Plan:

## 2011-09-13 NOTE — Assessment & Plan Note (Signed)
No sign of bleeding. Asked him to ask Dr Gaylyn Rong if aspirin intake is advisable

## 2011-09-13 NOTE — Assessment & Plan Note (Signed)
They will try OTC artificial tears. I sent in the Rx drops which he can also try.  I recommended an opthalmology visit for glaucoma check, etc

## 2011-09-13 NOTE — Assessment & Plan Note (Signed)
Omeprazole started, will check H. Pylori and treat if positive

## 2011-09-14 ENCOUNTER — Other Ambulatory Visit (INDEPENDENT_AMBULATORY_CARE_PROVIDER_SITE_OTHER): Payer: Medicare Other

## 2011-09-14 DIAGNOSIS — K2970 Gastritis, unspecified, without bleeding: Secondary | ICD-10-CM

## 2011-09-14 DIAGNOSIS — I10 Essential (primary) hypertension: Secondary | ICD-10-CM

## 2011-09-14 DIAGNOSIS — K2990 Gastroduodenitis, unspecified, without bleeding: Secondary | ICD-10-CM

## 2011-09-14 LAB — BASIC METABOLIC PANEL
CO2: 28 mEq/L (ref 19–32)
Calcium: 8.9 mg/dL (ref 8.4–10.5)
Glucose, Bld: 112 mg/dL — ABNORMAL HIGH (ref 70–99)
Sodium: 139 mEq/L (ref 135–145)

## 2011-09-14 NOTE — Progress Notes (Signed)
BMP AND HPYLORI DONE TODAY Ricky Garrett

## 2011-09-15 ENCOUNTER — Encounter: Payer: Self-pay | Admitting: Family Medicine

## 2011-09-15 ENCOUNTER — Telehealth: Payer: Self-pay | Admitting: Oncology

## 2011-09-15 ENCOUNTER — Telehealth: Payer: Self-pay | Admitting: *Deleted

## 2011-09-15 MED ORDER — AMOXICILLIN 500 MG PO TABS: 1000.0000 mg | ORAL_TABLET | Freq: Two times a day (BID) | ORAL | Status: AC

## 2011-09-15 MED ORDER — CLARITHROMYCIN 500 MG PO TABS: 500.0000 mg | ORAL_TABLET | Freq: Two times a day (BID) | ORAL | Status: AC

## 2011-09-15 NOTE — Telephone Encounter (Signed)
I spoke with daughter, Arna Medici and told her that the H. Pylori was positive and that I will be sending in a prescription for treatment that will lower the risk of ulcers and stomach cancer and hopefully will relieve his symptoms.

## 2011-09-15 NOTE — Telephone Encounter (Signed)
Called pt.

## 2011-09-15 NOTE — Telephone Encounter (Signed)
Pt has appt with eye doctor in sept. Informed of positive h-pylori. Fwd. To Dr.Hale. Lorenda Hatchet, Renato Battles

## 2011-09-15 NOTE — Telephone Encounter (Signed)
Called patient's daughter and informed her that we did not check cholesterol at the last visit. We did a lipid panel back in February. Gave results of other labs.Ricky Garrett, [name]

## 2011-09-15 NOTE — Telephone Encounter (Signed)
dtr Arna Medici called to r/s 10/10 appt to 10/21.

## 2011-09-15 NOTE — Telephone Encounter (Signed)
Pt's daughter is calling back and would like to know the results of her father's Cholesterol.Ricky Garrett

## 2011-09-15 NOTE — Addendum Note (Signed)
Addended by: Zachery Dauer on: 09/15/2011 12:56 PM   Modules accepted: Orders

## 2011-09-19 ENCOUNTER — Telehealth: Payer: Self-pay | Admitting: Family Medicine

## 2011-09-19 NOTE — Telephone Encounter (Signed)
Patient needs a letter for immigration stating that they do not speak English so he needs to be able to take the test in Spanish.  Please let her know when it is ready to be picked up.

## 2011-09-20 NOTE — Telephone Encounter (Signed)
Called pt's dtr and she request a letter from Dr. Hale stating, that she is unable to take the immigration test in english. She only speaks spanish and can not memorize it in english. I told her, that I would send her request to Dr.Hale. It may take one week to respond. She agreed.  Fwd. To Dr.Hale .Joanna Hall  

## 2011-11-09 ENCOUNTER — Other Ambulatory Visit: Payer: Medicare Other

## 2011-11-09 ENCOUNTER — Ambulatory Visit: Payer: Medicare Other | Admitting: Oncology

## 2011-11-09 ENCOUNTER — Other Ambulatory Visit: Payer: Medicare Other | Admitting: Lab

## 2011-11-17 ENCOUNTER — Other Ambulatory Visit: Payer: Self-pay | Admitting: Oncology

## 2011-11-17 DIAGNOSIS — D696 Thrombocytopenia, unspecified: Secondary | ICD-10-CM

## 2011-11-20 ENCOUNTER — Ambulatory Visit (HOSPITAL_BASED_OUTPATIENT_CLINIC_OR_DEPARTMENT_OTHER): Payer: Medicare Other | Admitting: Oncology

## 2011-11-20 ENCOUNTER — Telehealth: Payer: Self-pay | Admitting: Oncology

## 2011-11-20 ENCOUNTER — Encounter: Payer: Self-pay | Admitting: Oncology

## 2011-11-20 ENCOUNTER — Other Ambulatory Visit (HOSPITAL_BASED_OUTPATIENT_CLINIC_OR_DEPARTMENT_OTHER): Payer: Medicare Other | Admitting: Lab

## 2011-11-20 VITALS — BP 113/70 | HR 56 | Temp 97.1°F | Resp 20 | Ht 65.0 in | Wt 168.9 lb

## 2011-11-20 DIAGNOSIS — D696 Thrombocytopenia, unspecified: Secondary | ICD-10-CM

## 2011-11-20 DIAGNOSIS — F1010 Alcohol abuse, uncomplicated: Secondary | ICD-10-CM

## 2011-11-20 LAB — COMPREHENSIVE METABOLIC PANEL (CC13)
Albumin: 3.7 g/dL (ref 3.5–5.0)
BUN: 31 mg/dL — ABNORMAL HIGH (ref 7.0–26.0)
Calcium: 9.1 mg/dL (ref 8.4–10.4)
Chloride: 107 mEq/L (ref 98–107)
Creatinine: 0.9 mg/dL (ref 0.7–1.3)
Glucose: 102 mg/dl — ABNORMAL HIGH (ref 70–99)
Potassium: 4.4 mEq/L (ref 3.5–5.1)

## 2011-11-20 LAB — CBC WITH DIFFERENTIAL/PLATELET
Basophils Absolute: 0 10*3/uL (ref 0.0–0.1)
Eosinophils Absolute: 0.1 10*3/uL (ref 0.0–0.5)
HCT: 40.6 % (ref 38.4–49.9)
HGB: 13.5 g/dL (ref 13.0–17.1)
MCV: 93.5 fL (ref 79.3–98.0)
NEUT#: 2.2 10*3/uL (ref 1.5–6.5)
NEUT%: 50.5 % (ref 39.0–75.0)
RDW: 13.1 % (ref 11.0–14.6)
lymph#: 1.6 10*3/uL (ref 0.9–3.3)

## 2011-11-20 LAB — CHCC SMEAR

## 2011-11-20 NOTE — Telephone Encounter (Signed)
appts made and printed for pt om

## 2011-11-20 NOTE — Progress Notes (Signed)
[facility]  [phone] [phone]   OFFICE PROGRESS NOTE   Cc:  Tobin Chad, MD  DIAGNOSIS:  Chronic thrombocytopenia, most likely due to chronic EtOH.   CURRENT THERAPY:  Watchful observation.   INTERVAL HISTORY: Ricky Garrett 71 y.o. male returns for regular follow up with his daughter.  He reports feeling well.  He still drinks beers and heavy liquor everyday.  He denied visible source of bleeding.  He has no fatigue.  He babysits his grandkid.  He is independent of all activities of daily living.  Patient denies fever, anorexia, weight loss, fatigue, headache, visual changes, confusion, drenching night sweats, palpable lymph node swelling, mucositis, odynophagia, dysphagia, nausea vomiting, jaundice, chest pain, palpitation, shortness of breath, dyspnea on exertion, productive cough, gum bleeding, epistaxis, hematemesis, hemoptysis, abdominal pain, abdominal swelling, early satiety, melena, hematochezia, hematuria, skin rash, spontaneous bleeding, joint swelling, joint pain, heat or cold intolerance, bowel bladder incontinence, back pain, focal motor weakness, paresthesia, depression, suicidal or homicidal ideation, feeling hopelessness.   Past Medical History  Diagnosis Date  . Hypertension   . Hyperlipidemia   . Rhinitis, allergic   . Decreased hearing   . History of stomach ulcers     Past Surgical History  Procedure Date  . Nasal septum repair     Current Outpatient Prescriptions  Medication Sig Dispense Refill  . fluticasone (FLONASE) 50 MCG/ACT nasal spray Place 1 spray into the nose daily.      Marland Kitchen lisinopril-hydrochlorothiazide (PRINZIDE,ZESTORETIC) 20-12.5 MG per tablet Take 1 tablet by mouth daily.  30 tablet  12  . Multiple Vitamins-Minerals (CENTRUM SILVER) tablet Take 1 tablet by mouth daily.      Marland Kitchen omega-3 acid ethyl esters (LOVAZA) 1 G capsule Take 2 g by mouth 2 (two) times daily.      Marland Kitchen DISCONTD: fluticasone  (FLONASE) 50 MCG/ACT nasal spray Place 1 spray into the nose daily.  16 g  6  . DISCONTD: omega-3 acid ethyl esters (LOVAZA) 1 G capsule Take 2 capsules (2 g total) by mouth 2 (two) times daily.  120 capsule  12    ALLERGIES:   has no known allergies.  REVIEW OF SYSTEMS:  The rest of the 14-point review of system was negative.   Filed Vitals:   11/20/11 1541  BP: 113/70  Pulse: 56  Temp: 97.1 F (36.2 C)  Resp: 20   Wt Readings from Last 3 Encounters:  11/20/11 168 lb 14.4 oz (76.613 kg)  09/12/11 165 lb (74.844 kg)  06/02/11 166 lb 4.8 oz (75.433 kg)   ECOG Performance status: 0  PHYSICAL EXAMINATION:  General:  well-nourished man, in no acute distress.  Eyes:  no scleral icterus.  ENT:  There were no oropharyngeal lesions.  Neck was without thyromegaly.  Lymphatics:  Negative cervical, supraclavicular or axillary adenopathy.  Respiratory: lungs were clear bilaterally without wheezing or crackles.  Cardiovascular:  Regular rate and rhythm, S1/S2, without murmur, rub or gallop.  There was no pedal edema.  GI:  abdomen was soft, flat, nontender, nondistended, without organomegaly.  Muscoloskeletal:  no spinal tenderness of palpation of vertebral spine.  Skin exam was without echymosis, petichae.  Neuro exam was nonfocal.  Patient was able to get on and off exam table without assistance.  Gait was normal.  Patient was alerted and oriented.  Attention was good.   Language was appropriate.  Mood was normal without depression.  Speech was not pressured.  Thought content was not tangential.  LABORATORY/RADIOLOGY DATA:  Lab Results  Component Value Date   WBC 4.4 11/20/2011   HGB 13.5 11/20/2011   HCT 40.6 11/20/2011   PLT 92* 11/20/2011   GLUCOSE 102* 11/20/2011   CHOL 216* 03/22/2011   TRIG 287* 03/22/2011   HDL 33* 03/22/2011   LDLCALC 126* 03/22/2011   ALKPHOS 55 11/20/2011   ALT 35 11/20/2011   AST 38* 11/20/2011   NA 142 11/20/2011   K 4.4 11/20/2011   CL 107 11/20/2011    CREATININE 0.9 11/20/2011   BUN 31.0* 11/20/2011   CO2 27 11/20/2011   PSA 0.35 10/07/2008   HGBA1C 5.9* 11/03/2009     ASSESSMENT AND PLAN:  1.  Chronic, stable thrombocytopenia:  Many years without pancytopenia.  Most likely due to EtOH.  I strongly advised him to taper off his EtOH.  In the future, if his plt <80, or he has pancytopenia, then further work up may be considered such as diagnostic bone marrow biopsy.  However, at this time, with such mild, chronic thrombocytopenia, a bone marrow biopsy is of low clinical utility.   2.  Follow up:  Lab only in about 6 months.  Return visit in about 1 year.     The length of time of the face-to-face encounter was 15 minutes. More than 50% of time was spent counseling and coordination of care.

## 2011-11-20 NOTE — Patient Instructions (Addendum)
1.  Issue:  Low platelet count (thrombocytopenia).  This has been a chronic problem. 2.  Most likely benign (not cancer).  In the future, if WBC and Hgb also low, we may consider diagnostic bone marrow biopsy. 3.  Follow up:  Lab in 6 months.  Return visit in 1 year.

## 2011-11-28 ENCOUNTER — Other Ambulatory Visit: Payer: Self-pay | Admitting: *Deleted

## 2011-11-28 ENCOUNTER — Encounter: Payer: Self-pay | Admitting: Family Medicine

## 2011-11-28 DIAGNOSIS — I10 Essential (primary) hypertension: Secondary | ICD-10-CM

## 2011-11-29 MED ORDER — LISINOPRIL-HYDROCHLOROTHIAZIDE 20-12.5 MG PO TABS: 1.0000 | ORAL_TABLET | Freq: Every day | ORAL | Status: DC

## 2012-01-09 ENCOUNTER — Other Ambulatory Visit: Payer: Self-pay | Admitting: *Deleted

## 2012-01-09 DIAGNOSIS — D696 Thrombocytopenia, unspecified: Secondary | ICD-10-CM

## 2012-01-09 MED ORDER — OMEGA-3-ACID ETHYL ESTERS 1 G PO CAPS: 2.0000 g | ORAL_CAPSULE | Freq: Two times a day (BID) | ORAL | Status: DC

## 2012-01-16 ENCOUNTER — Other Ambulatory Visit: Payer: Self-pay | Admitting: *Deleted

## 2012-01-16 DIAGNOSIS — D696 Thrombocytopenia, unspecified: Secondary | ICD-10-CM

## 2012-01-16 MED ORDER — OMEGA-3-ACID ETHYL ESTERS 1 G PO CAPS: 2.0000 g | ORAL_CAPSULE | Freq: Two times a day (BID) | ORAL | Status: DC

## 2012-02-07 ENCOUNTER — Other Ambulatory Visit: Payer: Self-pay | Admitting: *Deleted

## 2012-02-07 DIAGNOSIS — D696 Thrombocytopenia, unspecified: Secondary | ICD-10-CM

## 2012-02-07 MED ORDER — FLUTICASONE PROPIONATE 50 MCG/ACT NA SUSP: 1.0000 | Freq: Every day | NASAL | Status: DC

## 2012-03-16 ENCOUNTER — Other Ambulatory Visit: Payer: Self-pay

## 2012-03-26 ENCOUNTER — Encounter: Payer: Self-pay | Admitting: Family Medicine

## 2012-03-26 ENCOUNTER — Ambulatory Visit (INDEPENDENT_AMBULATORY_CARE_PROVIDER_SITE_OTHER): Payer: Medicare Other | Admitting: Family Medicine

## 2012-03-26 VITALS — BP 131/81 | HR 62 | Ht 65.0 in | Wt 166.3 lb

## 2012-03-26 DIAGNOSIS — G4700 Insomnia, unspecified: Secondary | ICD-10-CM | POA: Insufficient documentation

## 2012-03-26 DIAGNOSIS — K219 Gastro-esophageal reflux disease without esophagitis: Secondary | ICD-10-CM

## 2012-03-26 DIAGNOSIS — E785 Hyperlipidemia, unspecified: Secondary | ICD-10-CM

## 2012-03-26 DIAGNOSIS — I10 Essential (primary) hypertension: Secondary | ICD-10-CM

## 2012-03-26 MED ORDER — RANITIDINE HCL 150 MG PO CAPS: 150.0000 mg | ORAL_CAPSULE | Freq: Every evening | ORAL | Status: DC

## 2012-03-26 MED ORDER — ZOSTER VACCINE LIVE 19400 UNT/0.65ML ~~LOC~~ SOLR: 0.6500 mL | Freq: Once | SUBCUTANEOUS | Status: DC

## 2012-03-26 NOTE — Assessment & Plan Note (Addendum)
He didn't seem interested in taking medications.  He denied depression, but I spoke with his daughter, Ricky Garrett, who says he has been irritable. Mainly this relates to being in a different country. He and his wife will return to [place] for a month the end of March, so that may help.

## 2012-03-26 NOTE — Assessment & Plan Note (Signed)
Persists after H pylori treatment. Will start H2 blocker, then PPI if necessary

## 2012-03-26 NOTE — Patient Instructions (Addendum)
  Please return to see Dr Sheffield Slider in 6 months.

## 2012-03-26 NOTE — Assessment & Plan Note (Signed)
Needs a fasting lipid profile to check triglycerides.

## 2012-03-26 NOTE — Progress Notes (Signed)
  Subjective:    Patient ID: Ricky Garrett, male    DOB: Nov 18, 1940, 72 y.o.   MRN: [id number]  HPI Hypertension - no problems taking the medications. He walks 30 minutes most days without chest pain or shortness of breath  GERD - He has epigastric pain frequently. Drinks some morning coffee, but no other caffienated drinks. The burning does rise into his chest and makes a bitter taste in his mouth. history of treatment for H. Pylori.   Insomnia - Goes to bed at 9PM awakens at midnight, lies awake until 2-3 AM. Arises at 5 AM. Not sleepy in the daytime. Doesn't take naps. Wife also has insomnia despite taking Gabapentin for her neck   Review of Systems     Objective:   Physical Exam  Constitutional: He appears well-developed and well-nourished.  Neck: No thyromegaly present.  Cardiovascular: Normal rate and regular rhythm.   No murmur heard. Pulmonary/Chest: Effort normal and breath sounds normal.  Abdominal: Soft. He exhibits no mass. There is no tenderness.  Musculoskeletal: He exhibits no edema.  Neurological: He is alert.  Psychiatric:  Mildly flattened affect.          Assessment & Plan:

## 2012-04-01 ENCOUNTER — Other Ambulatory Visit: Payer: Medicare Other

## 2012-04-01 DIAGNOSIS — E785 Hyperlipidemia, unspecified: Secondary | ICD-10-CM

## 2012-04-01 LAB — LIPID PANEL
Cholesterol: 237 mg/dL — ABNORMAL HIGH (ref 0–200)
Triglycerides: 228 mg/dL — ABNORMAL HIGH (ref <150)

## 2012-04-01 NOTE — Progress Notes (Signed)
FLP drawn today. Dewitt Hoes, MLS

## 2012-04-16 ENCOUNTER — Encounter: Payer: Self-pay | Admitting: Family Medicine

## 2012-04-16 DIAGNOSIS — E785 Hyperlipidemia, unspecified: Secondary | ICD-10-CM

## 2012-04-16 NOTE — Telephone Encounter (Signed)
error 

## 2012-04-18 ENCOUNTER — Encounter: Payer: Self-pay | Admitting: Family Medicine

## 2012-04-18 MED ORDER — SIMVASTATIN 40 MG PO TABS: 40.0000 mg | ORAL_TABLET | Freq: Every day | ORAL | Status: DC

## 2012-05-09 ENCOUNTER — Telehealth: Payer: Self-pay | Admitting: Oncology

## 2012-05-20 ENCOUNTER — Other Ambulatory Visit: Payer: Medicare Other

## 2012-06-14 ENCOUNTER — Other Ambulatory Visit: Payer: Medicare Other

## 2012-06-17 ENCOUNTER — Other Ambulatory Visit: Payer: Medicare Other

## 2012-06-17 DIAGNOSIS — E785 Hyperlipidemia, unspecified: Secondary | ICD-10-CM

## 2012-06-17 NOTE — Progress Notes (Signed)
CMP AND D-LDL DONE TODAY ROBERT BUSICK

## 2012-06-18 LAB — COMPREHENSIVE METABOLIC PANEL WITH GFR
ALT: 26 U/L (ref 0–53)
AST: 24 U/L (ref 0–37)
Albumin: 4.6 g/dL (ref 3.5–5.2)
Alkaline Phosphatase: 65 U/L (ref 39–117)
BUN: 18 mg/dL (ref 6–23)
CO2: 31 meq/L (ref 19–32)
Calcium: 9.6 mg/dL (ref 8.4–10.5)
Chloride: 102 meq/L (ref 96–112)
Creat: 0.94 mg/dL (ref 0.50–1.35)
Glucose, Bld: 118 mg/dL — ABNORMAL HIGH (ref 70–99)
Potassium: 4.2 meq/L (ref 3.5–5.3)
Sodium: 139 meq/L (ref 135–145)
Total Bilirubin: 0.4 mg/dL (ref 0.3–1.2)
Total Protein: 6.7 g/dL (ref 6.0–8.3)

## 2012-07-02 ENCOUNTER — Encounter: Payer: Self-pay | Admitting: Family Medicine

## 2012-08-20 ENCOUNTER — Ambulatory Visit (INDEPENDENT_AMBULATORY_CARE_PROVIDER_SITE_OTHER): Payer: Medicare Other | Admitting: Family Medicine

## 2012-08-20 ENCOUNTER — Encounter: Payer: Self-pay | Admitting: Family Medicine

## 2012-08-20 VITALS — BP 143/89 | HR 73 | Ht 65.0 in | Wt 165.9 lb

## 2012-08-20 DIAGNOSIS — E118 Type 2 diabetes mellitus with unspecified complications: Secondary | ICD-10-CM | POA: Insufficient documentation

## 2012-08-20 DIAGNOSIS — I10 Essential (primary) hypertension: Secondary | ICD-10-CM

## 2012-08-20 DIAGNOSIS — R3989 Other symptoms and signs involving the genitourinary system: Secondary | ICD-10-CM

## 2012-08-20 DIAGNOSIS — R7309 Other abnormal glucose: Secondary | ICD-10-CM

## 2012-08-20 DIAGNOSIS — R399 Unspecified symptoms and signs involving the genitourinary system: Secondary | ICD-10-CM | POA: Insufficient documentation

## 2012-08-20 DIAGNOSIS — E1169 Type 2 diabetes mellitus with other specified complication: Secondary | ICD-10-CM | POA: Insufficient documentation

## 2012-08-20 DIAGNOSIS — E785 Hyperlipidemia, unspecified: Secondary | ICD-10-CM

## 2012-08-20 DIAGNOSIS — R350 Frequency of micturition: Secondary | ICD-10-CM

## 2012-08-20 DIAGNOSIS — E119 Type 2 diabetes mellitus without complications: Secondary | ICD-10-CM

## 2012-08-20 LAB — POCT URINALYSIS DIPSTICK
Blood, UA: NEGATIVE
Glucose, UA: 100
Nitrite, UA: NEGATIVE
Protein, UA: NEGATIVE
Spec Grav, UA: >=1.03
Urobilinogen, UA: 2

## 2012-08-20 MED ORDER — METFORMIN HCL 500 MG PO TABS: 500.0000 mg | ORAL_TABLET | Freq: Every day | ORAL | Status: DC

## 2012-08-20 NOTE — Assessment & Plan Note (Signed)
Borderline control. 

## 2012-08-20 NOTE — Assessment & Plan Note (Signed)
Metformin started. Warned about possibility of diarrhea. Given information to read on diet and exercise. Will refer to the dieticians.

## 2012-08-20 NOTE — Assessment & Plan Note (Signed)
Warned to avoid otc antihistamines and decongestants but may take Loratadine. Consider alpha blocker to open outlet and lower blood pressure

## 2012-08-20 NOTE — Progress Notes (Signed)
  Subjective:    Patient ID: Ricky Garrett, male    DOB: 09-Aug-1940, 72 y.o.   MRN: [id number]  HPI Polyuria - he's noted frequency of urination and increased thirst. Daughter says he eats a lot of sweets. No burning on urination. URI symptoms x 2 weeks.   BPH? - mild hesitancy, straining and cutting off of urinary stream, but no urgency. Has nocturia x 3 + recently.   URI - His daughter and her children have had similar symptoms of sore throat, nasal congestion and mild cough. He was taking OTC coriciden, but that may have worsened his emptying symptoms   Interview conducted in Spanish  Review of Systems     Objective:   Physical Exam  Constitutional: He appears well-developed and well-nourished.  Mild abdominal obesity  HENT:  Right Ear: External ear normal.  Left Ear: External ear normal.  Nose: Nose normal.  Mouth/Throat: Oropharynx is clear and moist. No oropharyngeal exudate.  Eyes: Conjunctivae are normal.  Cardiovascular: Normal rate and regular rhythm.   No murmur heard. Pulmonary/Chest: Effort normal and breath sounds normal. He has no wheezes. He has no rales.  Abdominal: Soft. He exhibits no distension and no mass. There is no tenderness. There is no rebound and no guarding.  Musculoskeletal: He exhibits no edema.  Neurological: He is alert.  Psychiatric:  Subdued affect          Assessment & Plan:

## 2012-08-20 NOTE — Assessment & Plan Note (Signed)
Recheck lipids after diabetes controlled

## 2012-08-20 NOTE — Patient Instructions (Addendum)
We will arrange a visit with the dietician  Please return to see Dr Sheffield Slider in 1 month, Consider seeing Dr Mauricio Po thereafter.

## 2012-08-22 ENCOUNTER — Other Ambulatory Visit: Payer: Self-pay | Admitting: *Deleted

## 2012-08-22 MED ORDER — SIMVASTATIN 40 MG PO TABS: 40.0000 mg | ORAL_TABLET | Freq: Every day | ORAL | Status: DC

## 2012-09-04 ENCOUNTER — Other Ambulatory Visit: Payer: Self-pay

## 2012-09-24 ENCOUNTER — Ambulatory Visit (INDEPENDENT_AMBULATORY_CARE_PROVIDER_SITE_OTHER): Payer: Medicare Other | Admitting: Family Medicine

## 2012-09-24 ENCOUNTER — Encounter: Payer: Self-pay | Admitting: Family Medicine

## 2012-09-24 VITALS — BP 122/70 | HR 62 | Temp 99.1°F | Ht 65.0 in | Wt 165.0 lb

## 2012-09-24 DIAGNOSIS — I10 Essential (primary) hypertension: Secondary | ICD-10-CM

## 2012-09-24 DIAGNOSIS — K219 Gastro-esophageal reflux disease without esophagitis: Secondary | ICD-10-CM

## 2012-09-24 DIAGNOSIS — Z23 Encounter for immunization: Secondary | ICD-10-CM

## 2012-09-24 DIAGNOSIS — E119 Type 2 diabetes mellitus without complications: Secondary | ICD-10-CM

## 2012-09-24 MED ORDER — LISINOPRIL-HYDROCHLOROTHIAZIDE 10-12.5 MG PO TABS: 1.0000 | ORAL_TABLET | Freq: Every day | ORAL | Status: DC

## 2012-09-24 NOTE — Patient Instructions (Addendum)
Please return to see Dr Mauricio Po in 2 months.   We will do an A1c at that time.   Your blood pressure is low normal so I am decreasing the dose of Lisinopril. Let us know if the cough becomes too much of a problem

## 2012-09-26 NOTE — Assessment & Plan Note (Signed)
Since his A1c was only 7.6 before starting the metformin, I elected to defer providing a glucometer. He was encouraged to walk regularly building up to 30 minutes most days.

## 2012-09-26 NOTE — Assessment & Plan Note (Addendum)
His repeat blood pressure was higher, even standing, but I elected to decrease his Lisinopril dose by half and gave him a written Rx to replace his current one when the tabs run out.

## 2012-09-26 NOTE — Progress Notes (Signed)
  Subjective:    Patient ID: Ricky Garrett, male    DOB: September 13, 1940, 72 y.o.   MRN: [id number]  HPI Hypertension - sometimes feels slightly lightheaded when standing. He noted that he coughed several weeks after his last URI, but the cough is now resolved  GERD is improved on the Ranitidine  Diabetes - he is tolerating Metformin without diarrhea. No hypoglycemic episodes. He's reading diabetes treatment info in Spanish that we gave him. Doesn't have a meter to check his capilllary blood glucoses.   Review of Systems     Objective:   Physical Exam        Assessment & Plan:

## 2012-09-26 NOTE — Assessment & Plan Note (Signed)
Improved on Ranitidine

## 2012-11-15 ENCOUNTER — Telehealth: Payer: Self-pay | Admitting: Hematology and Oncology

## 2012-11-15 NOTE — Telephone Encounter (Signed)
Moved 10/20 appt from [facility] to 11/10 w/NG. S/w pt re new d/t for lb/NG 11/10.

## 2012-11-18 ENCOUNTER — Ambulatory Visit: Payer: Medicare Other | Admitting: Oncology

## 2012-11-18 ENCOUNTER — Other Ambulatory Visit: Payer: Medicare Other | Admitting: Lab

## 2012-11-29 ENCOUNTER — Ambulatory Visit (INDEPENDENT_AMBULATORY_CARE_PROVIDER_SITE_OTHER): Payer: Medicare Other | Admitting: Family Medicine

## 2012-11-29 ENCOUNTER — Encounter: Payer: Self-pay | Admitting: Family Medicine

## 2012-11-29 VITALS — BP 140/76 | HR 60 | Ht 65.0 in | Wt 163.0 lb

## 2012-11-29 DIAGNOSIS — E119 Type 2 diabetes mellitus without complications: Secondary | ICD-10-CM

## 2012-11-29 DIAGNOSIS — D696 Thrombocytopenia, unspecified: Secondary | ICD-10-CM

## 2012-11-29 DIAGNOSIS — I10 Essential (primary) hypertension: Secondary | ICD-10-CM

## 2012-11-29 LAB — CBC WITH DIFFERENTIAL/PLATELET
Eosinophils Relative: 2 % (ref 0–5)
HCT: 42.6 % (ref 39.0–52.0)
Lymphocytes Relative: 35 % (ref 12–46)
Lymphs Abs: 1.6 10*3/uL (ref 0.7–4.0)
MCV: 90.3 fL (ref 78.0–100.0)
Neutro Abs: 2.6 10*3/uL (ref 1.7–7.7)
Platelets: 102 10*3/uL — ABNORMAL LOW (ref 150–400)
RBC: 4.72 MIL/uL (ref 4.22–5.81)
WBC: 4.7 10*3/uL (ref 4.0–10.5)

## 2012-11-29 MED ORDER — ONETOUCH ULTRA SYSTEM W/DEVICE KIT: 1.0000 | PACK | Freq: Once | Status: DC

## 2012-11-29 MED ORDER — ASPIRIN EC 81 MG PO TBEC: 81.0000 mg | DELAYED_RELEASE_TABLET | Freq: Every day | ORAL | Status: DC

## 2012-11-29 MED ORDER — OMEGA-3-ACID ETHYL ESTERS 1 G PO CAPS: 2.0000 g | ORAL_CAPSULE | Freq: Two times a day (BID) | ORAL | Status: DC

## 2012-11-29 MED ORDER — SIMVASTATIN 40 MG PO TABS: 40.0000 mg | ORAL_TABLET | Freq: Every day | ORAL | Status: DC

## 2012-11-29 MED ORDER — METFORMIN HCL 500 MG PO TABS: 500.0000 mg | ORAL_TABLET | Freq: Every day | ORAL | Status: DC

## 2012-11-29 MED ORDER — LISINOPRIL-HYDROCHLOROTHIAZIDE 10-12.5 MG PO TABS: 1.0000 | ORAL_TABLET | Freq: Every day | ORAL | Status: DC

## 2012-11-29 MED ORDER — FLUTICASONE PROPIONATE 50 MCG/ACT NA SUSP: 1.0000 | Freq: Every day | NASAL | Status: DC

## 2012-11-29 NOTE — Progress Notes (Signed)
  Subjective:    Patient ID: Ricky Garrett, male    DOB: 11-27-1940, 72 y.o.   MRN: [id number]  HPI  Patient seen today for follow up of DM, HTN. Visit in Spanish. Son-in-law present for visit. Has several questions about whether he truly has diabetes.  For follow up of blood pressure as well.  Feels relatively well.  Walks a fair amount.    Also with history of thrombocytopenia in the past, with plt around 90K.  [facility]   Social Hx; Does not drink alcohol; former smoker, quit when around 72 yrs old.     Review of Systems Denies fevers or chills, no chest pain or dyspnea, no nausea/vomiting, no polyuria or polydipsia.  No easy bruising or bleeding.      Objective:   Physical Exam Well appearing, no apparent distress HEENT Neck supple, no cervical adenopathy.  COR regular S1S2 PULM Clear bilaterally, no rales or wheezes EXTS Monofilament exam with good sensation in all areas of both feet; palpable dp pulses bilaterally. No edema, no maceration between toes, no lesions.  SKIN no petechiae.        Assessment & Plan:

## 2012-11-29 NOTE — Patient Instructions (Signed)
Fue un placer verle hoy; me alegro que la hemoglobina glicosilada esta' en 6.1%.  Por ahora esto corresponde con el diagnostico de "pre-diabetes".    Le estoy chequeando las plaquetas en el conteo de [place].  Tel. [phone] Arna Medici, hija).  Mando las medicinas a la [facility] por 90 dias.  Recuerdese de tomar una aspirina 81mg  una vez diario (para prevencion de problemas cardiovasculares).  Proxima cita 3 meses (Fin de enero, [phone]).  FOLLOW UP IN 3 MONTHS WITH DR Mauricio Po

## 2012-11-29 NOTE — Assessment & Plan Note (Signed)
To check CBC for platelet count.

## 2012-11-29 NOTE — Assessment & Plan Note (Signed)
Patient with previous single-value Hgb A1C of 7.6%, now down to 6.1% with diet, exercise and metformin 500mg  one time daily.  Plan to continue with his current med dose, increase fiber in diet and recheck in 3 months.  Must have 2 consecutive A1C above 6.5% OR fasting glucose>125 x2, OR random glucose >200 with symptoms to meet diagnostic criteria.  His fasting sugars do not meet this criteria.  Plan to motivate him to lifestyle change in order to prevent diagnosis.  He is aware that treatment is similar to what we would be doing if DM diagnosis. Recommendation for ASA 81mg  daily for cardioprotection. Statin, ACEI as he is doing.

## 2012-11-29 NOTE — Assessment & Plan Note (Addendum)
Blood pressure is hovering around goal for non-diabetes (debate as to whether he meets strict criteria for DM).  To continue on low-dose lisinopril, other preventive measures such as statin, ASA.  Diet and exercise stressed today. For follow up in 3 months. He and son-in-law agree to this approach.

## 2012-12-02 ENCOUNTER — Encounter: Payer: Self-pay | Admitting: Family Medicine

## 2012-12-05 ENCOUNTER — Telehealth: Payer: Self-pay | Admitting: Hematology and Oncology

## 2012-12-05 NOTE — Telephone Encounter (Signed)
Pt's daughter called and r/s lab and MD to 11/17th

## 2012-12-09 ENCOUNTER — Ambulatory Visit: Payer: Medicare Other | Admitting: Hematology and Oncology

## 2012-12-09 ENCOUNTER — Other Ambulatory Visit: Payer: Medicare Other | Admitting: Lab

## 2012-12-17 ENCOUNTER — Encounter: Payer: Self-pay | Admitting: Hematology and Oncology

## 2012-12-17 ENCOUNTER — Telehealth: Payer: Self-pay | Admitting: Hematology and Oncology

## 2012-12-17 ENCOUNTER — Other Ambulatory Visit: Payer: Medicare Other | Admitting: Lab

## 2012-12-17 ENCOUNTER — Ambulatory Visit (HOSPITAL_BASED_OUTPATIENT_CLINIC_OR_DEPARTMENT_OTHER): Payer: Medicare Other | Admitting: Hematology and Oncology

## 2012-12-17 VITALS — BP 119/77 | HR 61 | Temp 97.2°F | Resp 20 | Ht 65.0 in | Wt 164.5 lb

## 2012-12-17 DIAGNOSIS — D696 Thrombocytopenia, unspecified: Secondary | ICD-10-CM

## 2012-12-17 NOTE — Progress Notes (Signed)
[facility] OFFICE PROGRESS NOTE  Barbaraann Barthel, MD DIAGNOSIS:  Chronic thrombocytopenia  SUMMARY OF HEMATOLOGIC HISTORY: This is a patient who is followed here because of chronic thrombocytopenia. Previously H. pylori serology came back positive. The patient was asymptomatic and just observe. INTERVAL HISTORY: Ricky Garrett 72 y.o. male returns for further followup. History was obtained through his daughter who access an interpreter The patient denies any recent signs or symptoms of bleeding such as spontaneous epistaxis, hematuria or hematochezia. He denies any recent fever, chills, night sweats or cough. He has lost some weight due to recent diagnosis of diabetes and he was placed on metformin. Previously, the prior physician felt that the patient was drinking heavily. Today, he denies drinking heavily. He would probably drinking one beer every other month.  I have reviewed the past medical history, past surgical history, social history and family history with the patient and they are unchanged from previous note.  ALLERGIES:  has No Known Allergies.  MEDICATIONS:  Current Outpatient Prescriptions  Medication Sig Dispense Refill  . aspirin EC 81 MG tablet Take 1 tablet (81 mg total) by mouth daily.  100 tablet  6  . fluticasone (FLONASE) 50 MCG/ACT nasal spray Place 1 spray into the nose daily.  16 g  5  . lisinopril-hydrochlorothiazide (PRINZIDE,ZESTORETIC) 10-12.5 MG per tablet Take 1 tablet by mouth daily.  90 tablet  3  . metFORMIN (GLUCOPHAGE) 500 MG tablet Take 1 tablet (500 mg total) by mouth daily with breakfast.  90 tablet  3  . Multiple Vitamins-Minerals (CENTRUM SILVER) tablet Take 1 tablet by mouth daily.      Marland Kitchen omega-3 acid ethyl esters (LOVAZA) 1 G capsule Take 2 capsules (2 g total) by mouth 2 (two) times daily.  180 capsule  3  . ranitidine (ZANTAC) 150 MG capsule Take 1 capsule (150 mg total) by mouth every evening.  30 capsule  11  . simvastatin (ZOCOR)  40 MG tablet Take 1 tablet (40 mg total) by mouth at bedtime.  90 tablet  3  . Blood Glucose Monitoring Suppl (ONE TOUCH ULTRA SYSTEM KIT) W/DEVICE KIT 1 kit by Does not apply route once.  1 each  0   No current facility-administered medications for this visit.     REVIEW OF SYSTEMS:   Constitutional: Denies fevers, chills or night sweats Eyes: Denies blurriness of vision Ears, nose, mouth, throat, and face: Denies mucositis or sore throat Respiratory: Denies cough, dyspnea or wheezes Cardiovascular: Denies palpitation, chest discomfort or lower extremity swelling Gastrointestinal:  Denies nausea, heartburn or change in bowel habits Skin: Denies abnormal skin rashes Lymphatics: Denies new lymphadenopathy or easy bruising Neurological:Denies numbness, tingling or new weaknesses Behavioral/Psych: Mood is stable, no new changes  All other systems were reviewed with the patient and are negative.  PHYSICAL EXAMINATION: ECOG PERFORMANCE STATUS: 0 - Asymptomatic  Filed Vitals:   12/17/12 1454  BP: 119/77  Pulse: 61  Temp: 97.2 F (36.2 C)  Resp: 20   Filed Weights   12/17/12 1454  Weight: 164 lb 8 oz (74.617 kg)    GENERAL:alert, no distress and comfortable SKIN: skin color, texture, turgor are normal, no rashes or significant lesions EYES: normal, Conjunctiva are pink and non-injected, sclera clear OROPHARYNX:no exudate, no erythema and lips, buccal mucosa, and tongue normal  NECK: supple, thyroid normal size, non-tender, without nodularity LYMPH:  no palpable lymphadenopathy in the cervical, axillary or inguinal LUNGS: clear to auscultation and percussion with normal breathing effort HEART: regular  rate & rhythm and no murmurs and no lower extremity edema ABDOMEN:abdomen soft, non-tender and normal bowel sounds Musculoskeletal:no cyanosis of digits and no clubbing  NEURO: alert & oriented x 3 with fluent speech, no focal motor/sensory deficits  LABORATORY DATA:  I have  reviewed the data as listed No results found for this or any previous visit (from the past 48 hour(s)).  Lab Results  Component Value Date   WBC 4.7 11/29/2012   HGB 15.0 11/29/2012   HCT 42.6 11/29/2012   MCV 90.3 11/29/2012   PLT 102* 11/29/2012    ASSESSMENT & PLAN:  #1 chronic thrombocytopenia Certainly H. pylori infection can cause for the thrombocytopenia. It is very stable. The diagnosis of ITP cannot be excluded. The patient is asymptomatic. I recommend history, physical examination, and blood work on a yearly basis. I have educated the patient signs and symptoms to watch out for bleeding and worsening easy bruising. If that happens, he will call me and I will see him back sooner.  If his platelet count dropped to close to 50,000 we will initiate further workup including imaging study and bone marrow biopsy. I addressed all questions and concerns through his daughter.    I spent 15 minutes counseling the patient face to face. The total time spent in the appointment was 20 minutes and more than 50% was on counseling.     [facility], Arshdeep Bolger, MD 12/17/2012 3:21 PM

## 2012-12-17 NOTE — Telephone Encounter (Signed)
pt called to confirm appt and sched lab from past missed appt...done

## 2012-12-18 ENCOUNTER — Telehealth: Payer: Self-pay | Admitting: Hematology and Oncology

## 2012-12-18 NOTE — Telephone Encounter (Signed)
Per 11/18 POF appt made 1 yr out CAL mailed to pt shh

## 2013-02-27 ENCOUNTER — Other Ambulatory Visit: Payer: Self-pay | Admitting: Family Medicine

## 2013-02-27 DIAGNOSIS — R7303 Prediabetes: Secondary | ICD-10-CM

## 2013-02-27 DIAGNOSIS — E119 Type 2 diabetes mellitus without complications: Secondary | ICD-10-CM

## 2013-02-27 MED ORDER — GLUCOSE BLOOD VI STRP: 1.0000 | ORAL_STRIP | Freq: Every day | Status: DC

## 2013-02-27 MED ORDER — ONETOUCH ULTRA SYSTEM W/DEVICE KIT: 1.0000 | PACK | Freq: Once | Status: DC

## 2013-02-27 MED ORDER — ONETOUCH LANCETS MISC: 1.0000 [IU] | Freq: Every day | Status: DC

## 2013-09-05 ENCOUNTER — Ambulatory Visit: Payer: Medicare Other | Admitting: Family Medicine

## 2013-09-09 ENCOUNTER — Other Ambulatory Visit: Payer: Self-pay | Admitting: *Deleted

## 2013-09-10 MED ORDER — SIMVASTATIN 40 MG PO TABS: 40.0000 mg | ORAL_TABLET | Freq: Every day | ORAL | Status: DC

## 2013-09-12 ENCOUNTER — Ambulatory Visit (INDEPENDENT_AMBULATORY_CARE_PROVIDER_SITE_OTHER): Payer: Medicare Other | Admitting: Family Medicine

## 2013-09-12 ENCOUNTER — Encounter: Payer: Self-pay | Admitting: Family Medicine

## 2013-09-12 VITALS — BP 138/74 | HR 59 | Temp 98.0°F | Ht 65.0 in | Wt 163.5 lb

## 2013-09-12 DIAGNOSIS — K219 Gastro-esophageal reflux disease without esophagitis: Secondary | ICD-10-CM

## 2013-09-12 DIAGNOSIS — F458 Other somatoform disorders: Secondary | ICD-10-CM

## 2013-09-12 DIAGNOSIS — E119 Type 2 diabetes mellitus without complications: Secondary | ICD-10-CM

## 2013-09-12 DIAGNOSIS — D126 Benign neoplasm of colon, unspecified: Secondary | ICD-10-CM | POA: Insufficient documentation

## 2013-09-12 DIAGNOSIS — R0989 Other specified symptoms and signs involving the circulatory and respiratory systems: Secondary | ICD-10-CM

## 2013-09-12 DIAGNOSIS — R09A2 Foreign body sensation, throat: Secondary | ICD-10-CM

## 2013-09-12 LAB — POCT GLYCOSYLATED HEMOGLOBIN (HGB A1C): Hemoglobin A1C: 6.2

## 2013-09-12 LAB — BASIC METABOLIC PANEL
BUN: 23 mg/dL (ref 6–23)
CHLORIDE: 100 meq/L (ref 96–112)
CO2: 29 mEq/L (ref 19–32)
CREATININE: 0.9 mg/dL (ref 0.50–1.35)
Calcium: 9.1 mg/dL (ref 8.4–10.5)
GLUCOSE: 148 mg/dL — AB (ref 70–99)
Potassium: 3.6 mEq/L (ref 3.5–5.3)
Sodium: 139 mEq/L (ref 135–145)

## 2013-09-12 LAB — LIPID PANEL
Cholesterol: 192 mg/dL (ref 0–200)
HDL: 34 mg/dL — AB (ref >39)
LDL CALC: 104 mg/dL — AB (ref 0–99)
Total CHOL/HDL Ratio: 5.6 Ratio
Triglycerides: 270 mg/dL — ABNORMAL HIGH (ref <150)
VLDL: 54 mg/dL — ABNORMAL HIGH (ref 0–40)

## 2013-09-12 MED ORDER — METFORMIN HCL 500 MG PO TABS: 500.0000 mg | ORAL_TABLET | Freq: Every day | ORAL | Status: DC

## 2013-09-12 MED ORDER — OMEPRAZOLE 40 MG PO CPDR: 40.0000 mg | DELAYED_RELEASE_CAPSULE | Freq: Every day | ORAL | Status: DC

## 2013-09-12 MED ORDER — SIMVASTATIN 40 MG PO TABS: 40.0000 mg | ORAL_TABLET | Freq: Every day | ORAL | Status: DC

## 2013-09-12 MED ORDER — LISINOPRIL-HYDROCHLOROTHIAZIDE 10-12.5 MG PO TABS: 1.0000 | ORAL_TABLET | Freq: Every day | ORAL | Status: DC

## 2013-09-12 NOTE — Assessment & Plan Note (Signed)
On Colonscopy March 2011, Dr. Paulita Fujita.  Patient for repeat c-scope 2016; recommended and referred. Also with history of recurrent dyspepsia and reflux.

## 2013-09-12 NOTE — Progress Notes (Signed)
   Subjective:    Patient ID: Ricky Garrett, male    DOB: Jun 06, 1940, 73 y.o.   MRN: [id number]  HPI Visit conducted in [place].  Patient is historian.    He reports that he is feeling well; his only complaint is a persistent globus sensation that is present when he eats solids and liquids. Is not associated with aspiration, chest pain or epigastric pain.  Does not have neck/throat pain.  Denies fevers/chills or sweats, no weight loss.  Has occasional GERD symptoms which he associates with certain foods.  Has been taking H2blocker without relief, on as-needed basis.  History of prior ulcer disease on EGD over a decade ago by his history.   In reviewing patient's chart, I see he had screening colonoscopy in March 2011 that was significant for tubular adenoma and with recommendation for 5-year follow up.   Family Hx; No known family history of CRC.    Social Hx; Patient with distant history of tobacco smoking. Drinks beer on rare occasions (a beer less than weekly). Lives with his wife.     Review of Systems See above.  Also, some mild bilateral eye irritation; positive for some clear rhinorrhea with coryza.      Objective:   Physical Exam Well appearing, no apparent distress HEENT Neck supple, no cervical adenopathy. Thyroid supple and nontender, non-nodular. TMs clear bilaterally. Nasal mucosa erythematous with white discharge. No frontal or maxillary sinus tenderness. Clear oropharynx without erythema or exudates COR regular S1S2, no extra sounds PULM Clear bilaterally, no rales or wheezes.  ABD Soft, nontender, nondistended. No RUQ tenderness, no masses noted.  EXTS No lower extremity edema noted.         Assessment & Plan:

## 2013-09-12 NOTE — Assessment & Plan Note (Signed)
History ulcers on previous EGD; has occasional symptoms of GERD associated with foods. No history of intraabdominal surgeries. No RUQ pain or symptoms of biliary colic. Trial PPI.  Is arranging for GI consult for adenomatous colon polyp follow up, to address if GERD not resolved with PPI.

## 2013-09-12 NOTE — Patient Instructions (Addendum)
Fue un [employer] fast streamer.   Para el colesterol, mande' de nuevo la receta para la simvastatin una tableta cada noche.   Estoy remitiendole al gastroenterologo (Dr Paulita Fujita) para seguimiento de los polipos precancerosos que detectaron en la ultima colonoscopia del 2011 (requiere un chequeo cada 5 anos).   Laboratorios para Teaching laboratory technician y la funcion renal hoy.  Follow up with Dr Lindell Noe 1 month.

## 2013-09-12 NOTE — Assessment & Plan Note (Signed)
Patient with persistent sensation of globus, not limited to solids or liquids. Not associated with aspiration. No chest or epigastric pain associated with this complaint, although he does have occasional GERD symptoms/dyspepsia with certain foods. He denies fevers/chills, or sweats.  He quit smoking cigarettes over 20 years ago and does not have cough or sputum production. Trial PPI at this time; he is to see Dr Gillermina Hu for repeat C-scope for prior tubular adenomatous polyp in 2011. May consider EGD at that time as well if deemed clinically appropriate.

## 2013-09-15 ENCOUNTER — Encounter: Payer: Self-pay | Admitting: Family Medicine

## 2013-11-15 ENCOUNTER — Other Ambulatory Visit: Payer: Self-pay | Admitting: Family Medicine

## 2013-12-15 ENCOUNTER — Other Ambulatory Visit: Payer: Self-pay | Admitting: Family Medicine

## 2013-12-16 ENCOUNTER — Telehealth: Payer: Self-pay | Admitting: Hematology and Oncology

## 2013-12-16 NOTE — Telephone Encounter (Signed)
pt daughter called to r/s pt appt...done....aware of new d.t

## 2013-12-18 ENCOUNTER — Other Ambulatory Visit: Payer: Medicare Other

## 2013-12-18 ENCOUNTER — Ambulatory Visit: Payer: Medicare Other | Admitting: Hematology and Oncology

## 2014-01-06 ENCOUNTER — Ambulatory Visit (INDEPENDENT_AMBULATORY_CARE_PROVIDER_SITE_OTHER): Payer: Medicare Other | Admitting: Family Medicine

## 2014-01-06 ENCOUNTER — Encounter: Payer: Self-pay | Admitting: Family Medicine

## 2014-01-06 VITALS — BP 190/102 | HR 80 | Temp 97.8°F | Ht 65.0 in | Wt 163.4 lb

## 2014-01-06 DIAGNOSIS — N400 Enlarged prostate without lower urinary tract symptoms: Secondary | ICD-10-CM

## 2014-01-06 DIAGNOSIS — D696 Thrombocytopenia, unspecified: Secondary | ICD-10-CM

## 2014-01-06 DIAGNOSIS — I10 Essential (primary) hypertension: Secondary | ICD-10-CM

## 2014-01-06 DIAGNOSIS — E119 Type 2 diabetes mellitus without complications: Secondary | ICD-10-CM

## 2014-01-06 DIAGNOSIS — K921 Melena: Secondary | ICD-10-CM

## 2014-01-06 LAB — POCT URINALYSIS DIPSTICK
Bilirubin, UA: NEGATIVE
Glucose, UA: NEGATIVE
KETONES UA: NEGATIVE
LEUKOCYTES UA: NEGATIVE
Nitrite, UA: NEGATIVE
PH UA: 7
PROTEIN UA: NEGATIVE
RBC UA: NEGATIVE
SPEC GRAV UA: 1.02
Urobilinogen, UA: 0.2

## 2014-01-06 LAB — POCT GLYCOSYLATED HEMOGLOBIN (HGB A1C): Hemoglobin A1C: 6.7

## 2014-01-06 MED ORDER — TERAZOSIN HCL 1 MG PO CAPS: 1.0000 mg | ORAL_CAPSULE | Freq: Every day | ORAL | Status: DC

## 2014-01-06 MED ORDER — LISINOPRIL-HYDROCHLOROTHIAZIDE 10-12.5 MG PO TABS: 2.0000 | ORAL_TABLET | Freq: Every day | ORAL | Status: DC

## 2014-01-06 NOTE — Assessment & Plan Note (Signed)
Hgb A1C 6.7%, although with recent increase of half a percentage point. Discussed raw number versus trend.  Continues on low-dose metformin, mostly dietary and lifestyle control. Recheck in 3 months to see if continues below 7 before med adjustments. Continue with ASA,

## 2014-01-06 NOTE — Progress Notes (Signed)
   Subjective:    Patient ID: Ricky Garrett, male    DOB: [birth year], 73 y.o.   MRN: [id number]  HPI Visit in [place].   Patient presents for follow up. No changes in his diet or exercise. Weight unchanged.    He reports increase in nocturia, up to 4x/night. Some burning in the urine as well when he voids. No fevers or chills, no abdominal pain. The polyuria has been ongoing for a few months.   Also reports seeing blood in stool intermittently. He has noticed stools to be more malodorous than usual. SOmetimes watery in consistency. This is a change for him.  Had tubular adenoma removed by colonoscopy with Dr Paulita Fujita in 2011, is scheduled for repeat c-scope in 2016.   Social Hx; QUit smoking over 20 years ago.     Review of Systems     Objective:   Physical Exam Generally well appearing, no apparent distress HEENT neck supple, no cervical adenopathy. Moist mucus membranes COR regular S1S2, no extra sounds PULM Clear bilaterally, no rales or wheezes ABD Soft, nontender, nondistended. No CVA or suprapubic tenderness.  DRE: Non-nodular enlarged symmetric prostate that is not tender. Good rectal tone. No gross blood on glove.  EXTS no edema in legs.        Assessment & Plan:

## 2014-01-06 NOTE — Assessment & Plan Note (Signed)
Patient with symmetric nontender prostate on exam, non-nodular. Given HTN, addition of terazosin to regimen 1mg  at bedtime. Follow up in RN visit for BP, labs. Physician visit afterward.

## 2014-01-06 NOTE — Progress Notes (Signed)
mileidy [id number] was the spanish interpreter used during the work up. Michae Grimley CMA

## 2014-01-06 NOTE — Patient Instructions (Signed)
Fue un [employer] fast streamer.   PARA LA PRESION ARTERIAL:  1. De la Lisinopril/HCTZ 10/12.5, TOME 2 tabletas juntas todos los dias.   2. Arelia Sneddon que vuelva a la oficina en 2 semanas para ver la enfermera y chequearse la presion y [facility] en 2 semanas. NO TIENE QUE ESTAR EN AYUNAS.  PARA LA Chloride:  1. Estamos haciendo un analisis de orina hoy;  2. Mande' una receta para una nueva medicina que se llama de Terazosin 1mg , una tableta cada noche antes de dormir.  Estoy refiriendole al Gertie Fey para los cambios que ha notado cuando va al bano.  PATIENT NEEDS RN VISIT FOR BP CHECK AND LABS (CBC AND BMET) IN 2 WEEKS. PHYSICIAN VISIT WITH DR Lindell Noe IN 4-6 WEEKS REFERRAL TO GI ORDERED TODAY.

## 2014-01-06 NOTE — Assessment & Plan Note (Signed)
Blood pressure not well controlled. To increase lisinopril 10/12.5 to 20/25 one time daily; addition of Terazosin for BPH which may also help reduce blood pressure. Patient does not take ED medications or NTG. For RN check of BP, as well as metabolic panel in 2 weeks. Physician visit afterward.

## 2014-01-16 ENCOUNTER — Other Ambulatory Visit: Payer: Medicare Other

## 2014-01-21 ENCOUNTER — Ambulatory Visit (INDEPENDENT_AMBULATORY_CARE_PROVIDER_SITE_OTHER): Payer: Medicare Other | Admitting: *Deleted

## 2014-01-21 ENCOUNTER — Other Ambulatory Visit: Payer: Medicare Other

## 2014-01-21 DIAGNOSIS — I10 Essential (primary) hypertension: Secondary | ICD-10-CM

## 2014-01-21 DIAGNOSIS — D696 Thrombocytopenia, unspecified: Secondary | ICD-10-CM

## 2014-01-21 LAB — CBC
HCT: 39.3 % (ref 39.0–52.0)
Hemoglobin: 14 g/dL (ref 13.0–17.0)
MCH: 30.6 pg (ref 26.0–34.0)
MCHC: 35.6 g/dL (ref 30.0–36.0)
MCV: 85.8 fL (ref 78.0–100.0)
MPV: 11.3 fL (ref 9.4–12.4)
PLATELETS: 107 10*3/uL — AB (ref 150–400)
RBC: 4.58 MIL/uL (ref 4.22–5.81)
RDW: 13.4 % (ref 11.5–15.5)
WBC: 4.7 10*3/uL (ref 4.0–10.5)

## 2014-01-21 LAB — BASIC METABOLIC PANEL
BUN: 17 mg/dL (ref 6–23)
CALCIUM: 9 mg/dL (ref 8.4–10.5)
CHLORIDE: 100 meq/L (ref 96–112)
CO2: 25 meq/L (ref 19–32)
Creat: 0.89 mg/dL (ref 0.50–1.35)
Glucose, Bld: 142 mg/dL — ABNORMAL HIGH (ref 70–99)
Potassium: 3.8 mEq/L (ref 3.5–5.3)
Sodium: 138 mEq/L (ref 135–145)

## 2014-01-21 NOTE — Progress Notes (Signed)
Pt arrived today for lab visit and BP check.  BP was taken with machine results are 135/77 with pulse of 74.  Pt endorses taking his meds daily.  Advised to continue as instructed. Fleeger, Salome Spotted

## 2014-01-21 NOTE — Progress Notes (Unsigned)
BMP AND CBC DONE TODAY Ricky Garrett

## 2014-01-26 ENCOUNTER — Encounter: Payer: Self-pay | Admitting: Family Medicine

## 2014-01-29 ENCOUNTER — Other Ambulatory Visit: Payer: Medicare Other

## 2014-01-29 ENCOUNTER — Ambulatory Visit (HOSPITAL_BASED_OUTPATIENT_CLINIC_OR_DEPARTMENT_OTHER): Payer: Medicare Other | Admitting: Hematology and Oncology

## 2014-01-29 ENCOUNTER — Telehealth: Payer: Self-pay | Admitting: Hematology and Oncology

## 2014-01-29 ENCOUNTER — Encounter: Payer: Self-pay | Admitting: Hematology and Oncology

## 2014-01-29 VITALS — BP 137/58 | HR 65 | Temp 97.9°F | Resp 19 | Ht 65.0 in | Wt 168.2 lb

## 2014-01-29 DIAGNOSIS — D696 Thrombocytopenia, unspecified: Secondary | ICD-10-CM

## 2014-01-29 NOTE — Telephone Encounter (Signed)
gv and printed appt sched and avs for pt for Dec 2016 °

## 2014-01-29 NOTE — Assessment & Plan Note (Signed)
He is not symptomatic. Platelet count is improving spontaneously. Overall presentation is still consistent with ITP. I told the patient and family members, as well as his prior count is well above 50,000, he does not require additional workup or treatment. I will continue to see him on a yearly basis.

## 2014-01-29 NOTE — Progress Notes (Signed)
[place], MD SUMMARY OF HEMATOLOGIC HISTORY:  This is a patient who is followed here because of chronic thrombocytopenia. Previously H. pylori serology came back positive. The patient was asymptomatic and just observe. INTERVAL HISTORY: Ricky Garrett 73 y.o. male returns for further follow-up. He feels well. The patient denies any recent signs or symptoms of bleeding such as spontaneous epistaxis, hematuria or hematochezia.   I have reviewed the past medical history, past surgical history, social history and family history with the patient and they are unchanged from previous note.  ALLERGIES:  has No Known Allergies.  MEDICATIONS:  Current Outpatient Prescriptions  Medication Sig Dispense Refill  . aspirin EC 81 MG tablet Take 1 tablet (81 mg total) by mouth daily. 100 tablet 6  . fluticasone (FLONASE) 50 MCG/ACT nasal spray Place 1 spray into the nose daily. 16 g 5  . lisinopril-hydrochlorothiazide (PRINZIDE,ZESTORETIC) 10-12.5 MG per tablet Take 2 tablets by mouth daily. 180 tablet 3  . metFORMIN (GLUCOPHAGE) 500 MG tablet Take 1 tablet (500 mg total) by mouth daily with breakfast. 90 tablet 3  . Multiple Vitamins-Minerals (CENTRUM SILVER) tablet Take 1 tablet by mouth daily.    Marland Kitchen omega-3 acid ethyl esters (LOVAZA) 1 G capsule TAKE TWO CAPSULES BY MOUTH TWICE DAILY 180 capsule 0  . omeprazole (PRILOSEC) 40 MG capsule Take 1 capsule (40 mg total) by mouth daily. 30 capsule 3  . simvastatin (ZOCOR) 40 MG tablet Take 1 tablet (40 mg total) by mouth at bedtime. 90 tablet 3  . terazosin (HYTRIN) 1 MG capsule Take 1 capsule (1 mg total) by mouth at bedtime. 30 capsule 3  . FLUZONE HIGH-DOSE 0.5 ML SUSY   0   No current facility-administered medications for this visit.     REVIEW OF SYSTEMS:   Constitutional: Denies fevers, chills or night sweats Eyes: Denies blurriness of vision Ears, nose, mouth, throat, and face: Denies mucositis  or sore throat Respiratory: Denies cough, dyspnea or wheezes Cardiovascular: Denies palpitation, chest discomfort or lower extremity swelling Gastrointestinal:  Denies nausea, heartburn or change in bowel habits Skin: Denies abnormal skin rashes Lymphatics: Denies new lymphadenopathy or easy bruising Neurological:Denies numbness, tingling or new weaknesses Behavioral/Psych: Mood is stable, no new changes  All other systems were reviewed with the patient and are negative.  PHYSICAL EXAMINATION: ECOG PERFORMANCE STATUS: 0 - Asymptomatic  Filed Vitals:   01/29/14 1512  BP: 137/58  Pulse: 65  Temp: 97.9 F (36.6 C)  Resp: 19   Filed Weights   01/29/14 1512  Weight: 168 lb 3.2 oz (76.295 kg)    GENERAL:alert, no distress and comfortable SKIN: skin color, texture, turgor are normal, no rashes or significant lesions EYES: normal, Conjunctiva are pink and non-injected, sclera clear Musculoskeletal:no cyanosis of digits and no clubbing  NEURO: alert & oriented x 3 with fluent speech, no focal motor/sensory deficits  LABORATORY DATA:  I have reviewed the data as listed No results found for this or any previous visit (from the past 48 hour(s)).  Lab Results  Component Value Date   WBC 4.7 01/21/2014   HGB 14.0 01/21/2014   HCT 39.3 01/21/2014   MCV 85.8 01/21/2014   PLT 107* 01/21/2014   ASSESSMENT & PLAN:  Thrombocytopenia He is not symptomatic. Platelet count is improving spontaneously. Overall presentation is still consistent with ITP. I told the patient and family members, as well as his prior count is well above 50,000, he does not require additional  workup or treatment. I will continue to see him on a yearly basis.   All questions were answered. The patient knows to call the clinic with any problems, questions or concerns. No barriers to learning was detected.  I spent 15 minutes counseling the patient face to face. The total time spent in the appointment was 20 minutes  and more than 50% was on counseling.     [place], [place], MD 01/29/2014 3:30 PM

## 2014-01-30 HISTORY — PX: COLONOSCOPY: SHX174

## 2014-02-02 DIAGNOSIS — R109 Unspecified abdominal pain: Secondary | ICD-10-CM | POA: Diagnosis not present

## 2014-02-02 DIAGNOSIS — R197 Diarrhea, unspecified: Secondary | ICD-10-CM | POA: Diagnosis not present

## 2014-02-02 DIAGNOSIS — K921 Melena: Secondary | ICD-10-CM | POA: Diagnosis not present

## 2014-02-27 ENCOUNTER — Other Ambulatory Visit: Payer: Self-pay | Admitting: Gastroenterology

## 2014-02-27 DIAGNOSIS — K5730 Diverticulosis of large intestine without perforation or abscess without bleeding: Secondary | ICD-10-CM | POA: Diagnosis not present

## 2014-02-27 DIAGNOSIS — R1013 Epigastric pain: Secondary | ICD-10-CM | POA: Diagnosis not present

## 2014-02-27 DIAGNOSIS — D126 Benign neoplasm of colon, unspecified: Secondary | ICD-10-CM | POA: Diagnosis not present

## 2014-02-27 DIAGNOSIS — D123 Benign neoplasm of transverse colon: Secondary | ICD-10-CM | POA: Diagnosis not present

## 2014-02-27 DIAGNOSIS — K644 Residual hemorrhoidal skin tags: Secondary | ICD-10-CM | POA: Diagnosis not present

## 2014-02-27 DIAGNOSIS — K921 Melena: Secondary | ICD-10-CM | POA: Diagnosis not present

## 2014-02-27 DIAGNOSIS — D122 Benign neoplasm of ascending colon: Secondary | ICD-10-CM | POA: Diagnosis not present

## 2014-02-27 DIAGNOSIS — Z86010 Personal history of colonic polyps: Secondary | ICD-10-CM | POA: Diagnosis not present

## 2014-02-27 DIAGNOSIS — R197 Diarrhea, unspecified: Secondary | ICD-10-CM | POA: Diagnosis not present

## 2014-02-27 DIAGNOSIS — K648 Other hemorrhoids: Secondary | ICD-10-CM | POA: Diagnosis not present

## 2014-02-27 DIAGNOSIS — K2950 Unspecified chronic gastritis without bleeding: Secondary | ICD-10-CM | POA: Diagnosis not present

## 2014-02-27 DIAGNOSIS — K2970 Gastritis, unspecified, without bleeding: Secondary | ICD-10-CM | POA: Diagnosis not present

## 2014-03-02 ENCOUNTER — Other Ambulatory Visit: Payer: Self-pay | Admitting: Family Medicine

## 2014-03-05 ENCOUNTER — Other Ambulatory Visit: Payer: Self-pay | Admitting: Family Medicine

## 2014-03-12 ENCOUNTER — Telehealth: Payer: Self-pay | Admitting: Family Medicine

## 2014-03-12 NOTE — Telephone Encounter (Signed)
Called patient's home number and spoke with daughter, she says that GI office called and notified of biopsy results, recommendation for repeat colonoscopy in 5 years.

## 2014-04-01 ENCOUNTER — Other Ambulatory Visit: Payer: Self-pay | Admitting: Family Medicine

## 2014-04-01 ENCOUNTER — Encounter: Payer: Self-pay | Admitting: Family Medicine

## 2014-04-01 DIAGNOSIS — R0989 Other specified symptoms and signs involving the circulatory and respiratory systems: Secondary | ICD-10-CM

## 2014-04-01 DIAGNOSIS — N400 Enlarged prostate without lower urinary tract symptoms: Secondary | ICD-10-CM

## 2014-04-01 DIAGNOSIS — I10 Essential (primary) hypertension: Secondary | ICD-10-CM

## 2014-04-01 DIAGNOSIS — E119 Type 2 diabetes mellitus without complications: Secondary | ICD-10-CM

## 2014-04-01 MED ORDER — SIMVASTATIN 40 MG PO TABS: 40.0000 mg | ORAL_TABLET | Freq: Every day | ORAL | Status: DC

## 2014-04-01 MED ORDER — LISINOPRIL-HYDROCHLOROTHIAZIDE 10-12.5 MG PO TABS: 2.0000 | ORAL_TABLET | Freq: Every day | ORAL | Status: DC

## 2014-04-01 MED ORDER — TERAZOSIN HCL 1 MG PO CAPS: 1.0000 mg | ORAL_CAPSULE | Freq: Every day | ORAL | Status: DC

## 2014-04-01 MED ORDER — METFORMIN HCL 500 MG PO TABS: 500.0000 mg | ORAL_TABLET | Freq: Every day | ORAL | Status: DC

## 2014-04-01 MED ORDER — OMEPRAZOLE 40 MG PO CPDR: 40.0000 mg | DELAYED_RELEASE_CAPSULE | Freq: Every day | ORAL | Status: DC

## 2014-05-12 ENCOUNTER — Encounter: Payer: Self-pay | Admitting: Family Medicine

## 2014-09-21 ENCOUNTER — Ambulatory Visit (INDEPENDENT_AMBULATORY_CARE_PROVIDER_SITE_OTHER): Payer: Medicare Other | Admitting: Family Medicine

## 2014-09-21 VITALS — BP 128/74 | HR 77 | Temp 99.3°F | Ht 65.0 in | Wt 158.4 lb

## 2014-09-21 DIAGNOSIS — I10 Essential (primary) hypertension: Secondary | ICD-10-CM | POA: Diagnosis not present

## 2014-09-21 DIAGNOSIS — D696 Thrombocytopenia, unspecified: Secondary | ICD-10-CM

## 2014-09-21 DIAGNOSIS — E785 Hyperlipidemia, unspecified: Secondary | ICD-10-CM

## 2014-09-21 DIAGNOSIS — K219 Gastro-esophageal reflux disease without esophagitis: Secondary | ICD-10-CM | POA: Diagnosis not present

## 2014-09-21 DIAGNOSIS — E119 Type 2 diabetes mellitus without complications: Secondary | ICD-10-CM | POA: Diagnosis not present

## 2014-09-21 LAB — POCT GLYCOSYLATED HEMOGLOBIN (HGB A1C): Hemoglobin A1C: 6.6

## 2014-09-21 MED ORDER — LISINOPRIL-HYDROCHLOROTHIAZIDE 20-25 MG PO TABS: 1.0000 | ORAL_TABLET | Freq: Every day | ORAL | Status: DC

## 2014-09-21 NOTE — Patient Instructions (Signed)
Thank you for coming in today!  We are checking some labs today, and we'll call you if they are abnormal. If you do not hear from me by phone or letter in 2 weeks, please call us as we may have been unable to reach you.   - Start taking lisinopril HCTZ 20-25mg  once a day instead of the previous dose with 2 pills. You should keep taking the rest of your medications - Right now, I don't think you need to be checking your blood sugars.  - Your weight loss is not very concerning to me right now, but I think we should follow it in about 2-3 months.   Our clinic's number is [phone]. Feel free to call any time with questions or concerns. We will answer any questions after hours with our 24-hour emergency line at that number as well.   - Dr. Bonner Puna

## 2014-09-22 ENCOUNTER — Encounter: Payer: Self-pay | Admitting: Family Medicine

## 2014-09-22 LAB — COMPREHENSIVE METABOLIC PANEL
ALBUMIN: 4.3 g/dL (ref 3.6–5.1)
ALK PHOS: 70 U/L (ref 40–115)
ALT: 21 U/L (ref 9–46)
AST: 21 U/L (ref 10–35)
BUN: 26 mg/dL — ABNORMAL HIGH (ref 7–25)
CALCIUM: 9 mg/dL (ref 8.6–10.3)
CO2: 30 mmol/L (ref 20–31)
Chloride: 102 mmol/L (ref 98–110)
Creat: 0.87 mg/dL (ref 0.70–1.18)
Glucose, Bld: 132 mg/dL — ABNORMAL HIGH (ref 65–99)
POTASSIUM: 4 mmol/L (ref 3.5–5.3)
Sodium: 142 mmol/L (ref 135–146)
TOTAL PROTEIN: 6.9 g/dL (ref 6.1–8.1)
Total Bilirubin: 0.4 mg/dL (ref 0.2–1.2)

## 2014-09-22 LAB — CBC
HEMATOCRIT: 39.5 % (ref 39.0–52.0)
HEMOGLOBIN: 13.7 g/dL (ref 13.0–17.0)
MCH: 30.4 pg (ref 26.0–34.0)
MCHC: 34.7 g/dL (ref 30.0–36.0)
MCV: 87.8 fL (ref 78.0–100.0)
MPV: 10.7 fL (ref 8.6–12.4)
Platelets: 108 10*3/uL — ABNORMAL LOW (ref 150–400)
RBC: 4.5 MIL/uL (ref 4.22–5.81)
RDW: 14.3 % (ref 11.5–15.5)
WBC: 5 10*3/uL (ref 4.0–10.5)

## 2014-09-22 LAB — LIPID PANEL
CHOLESTEROL: 135 mg/dL (ref 125–200)
HDL: 31 mg/dL — ABNORMAL LOW (ref >=40)
LDL Cholesterol: 60 mg/dL (ref <130)
TRIGLYCERIDES: 219 mg/dL — AB (ref <150)
Total CHOL/HDL Ratio: 4.4 Ratio (ref <=5.0)
VLDL: 44 mg/dL — AB (ref <30)

## 2014-09-22 NOTE — Assessment & Plan Note (Signed)
Consider augmenting to high-intensity statin therapy if LDL > 70.

## 2014-09-22 NOTE — Assessment & Plan Note (Signed)
No poor wound healing/bleeding/bruising. Check CBC.

## 2014-09-22 NOTE — Progress Notes (Signed)
Subjective: Ricky Garrett is a 74 y.o. male presenting for medication refills. He would like to come off of his medications.   He complains of weight loss associated with loss of appetite. He forces himself to eat most meals, but does not dislike the taste of food. He denies dysphagia, nausea, vomiting, diarrhea. No fevers/chills/night sweats.   Medications were reconciled and med list was updated. He denies any noxious side effects from the medications. Denies CP, SOB, palpitations, syncope, dizziness, orthopnea, PND, frequent headaches, vision changes, claudication, leg swelling. No problems urinating.   He doesn't check his CBGs at home, but denies polyuria, polydipsia, polyphagia.   - Non-smoker  Objective: BP 128/74 mmHg  Pulse 77  Temp(Src) 99.3 F (37.4 C) (Oral)  Ht 5\' 5"  (1.651 m)  Wt 158 lb 6.4 oz (71.85 kg)  BMI 26.36 kg/m2 Gen: Well-appearing 74 y.o. male in no distress HEENT: MMM, posterior oropharynx clear Pulm: Non-labored; CTAB, no wheezes  CV: Regular rate, no murmur appreciated; distal pulses intact/symmetric GI: + BS; soft, non-tender, non-distended Skin: No rashes, wounds, ulcers Neuro: A&Ox3, CN II-XII without deficits  Assessment/Plan: Ricky Garrett is a 74 y.o. male here for medication refills.  See problem list for plan.

## 2014-09-22 NOTE — Assessment & Plan Note (Signed)
At goal on ACE-HCTZ, with dose increased at last OV. Will Rx increased dose in order to reduce pill burden. Checking labs today.

## 2014-09-22 NOTE — Assessment & Plan Note (Signed)
Well controlled T2DM on lowest dose metformin mono-tx. Will continue this as pt is tolerating well and it is keeping his A1c at goal (6.7 > 6.6%).  - Continue ASA, ACE, statin.  - Check CMP (given weight loss and statin use), lipids.

## 2014-09-25 ENCOUNTER — Encounter: Payer: Self-pay | Admitting: Family Medicine

## 2014-10-19 DIAGNOSIS — E119 Type 2 diabetes mellitus without complications: Secondary | ICD-10-CM | POA: Diagnosis not present

## 2014-10-19 DIAGNOSIS — H43811 Vitreous degeneration, right eye: Secondary | ICD-10-CM | POA: Diagnosis not present

## 2014-10-19 DIAGNOSIS — H2513 Age-related nuclear cataract, bilateral: Secondary | ICD-10-CM | POA: Diagnosis not present

## 2014-10-19 LAB — HM DIABETES EYE EXAM

## 2014-10-20 DIAGNOSIS — Z23 Encounter for immunization: Secondary | ICD-10-CM | POA: Diagnosis not present

## 2014-12-08 ENCOUNTER — Encounter: Payer: Self-pay | Admitting: Family Medicine

## 2014-12-31 ENCOUNTER — Telehealth: Payer: Self-pay | Admitting: Hematology and Oncology

## 2014-12-31 NOTE — Telephone Encounter (Signed)
returned call and lvm for pt to call back to r/s appt °

## 2015-01-08 ENCOUNTER — Encounter: Payer: Self-pay | Admitting: Hematology and Oncology

## 2015-01-11 ENCOUNTER — Other Ambulatory Visit: Payer: Self-pay | Admitting: Hematology and Oncology

## 2015-01-15 ENCOUNTER — Ambulatory Visit: Payer: Medicare Other | Admitting: Hematology and Oncology

## 2015-01-15 ENCOUNTER — Other Ambulatory Visit: Payer: Medicare Other

## 2015-01-28 ENCOUNTER — Encounter: Payer: Self-pay | Admitting: Family Medicine

## 2015-01-28 ENCOUNTER — Ambulatory Visit (INDEPENDENT_AMBULATORY_CARE_PROVIDER_SITE_OTHER): Payer: Medicare Other | Admitting: Family Medicine

## 2015-01-28 VITALS — BP 141/73 | HR 94 | Temp 98.0°F | Wt 159.0 lb

## 2015-01-28 DIAGNOSIS — L989 Disorder of the skin and subcutaneous tissue, unspecified: Secondary | ICD-10-CM

## 2015-01-28 MED ORDER — TRIAMCINOLONE ACETONIDE 0.5 % EX CREA: 1.0000 "application " | TOPICAL_CREAM | Freq: Two times a day (BID) | CUTANEOUS | Status: DC

## 2015-01-28 NOTE — Progress Notes (Signed)
   Subjective:    Patient ID: Ricky Garrett, male    DOB: 10/04/1940, 74 y.o.   MRN: [id number]  HPI  [employer] video interpreter Leda Quail [phone]  Patient presents for Same Day Appointment  CC: bump on ear  # Skin lesion:  left ear  First noticed about 1 month ago, thinks it was from a bug bite ?mosquito  In the past 5-6 days it seemed to have gotten much more itchy, painful  He has scratched it and caused a crust to form over it  Has used an insect bite cream on it but doesn't think it is better  Thinks it has gotten bigger ROS: no changes in hearing, no other lesions or rashes  Social Hx: former smoker  Review of Systems   See HPI for ROS.   Past medical history, surgical, family, and social history reviewed and updated in the EMR as appropriate.  Objective:  BP 141/73 mmHg  Pulse 94  Temp(Src) 98 F (36.7 C) (Oral)  Wt 159 lb (72.122 kg) Vitals and nursing note reviewed  General: NAD Skin: left upper antihelix in ear is an approx 47mm diameter with some telangiectasia, central scab/ulceration. No drainage. No major tenderness.   Assessment & Plan:   1. 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 Conspicuous area on ear so if desires removal or assesses need for biopsy (for possible BCC) would likely need to refer out to derm/plastics. Will do a trial of triamcinolone 0.5% cream and follow up in 1 month.

## 2015-01-28 NOTE — Patient Instructions (Signed)
Use the cream twice a day for 1 month. Come back to clinic and have it looked at then.   Utilice la crema dos veces al da durante 1 mes. Vuelve a la clnica y haz que se vea entonces.

## 2015-03-04 ENCOUNTER — Encounter: Payer: Self-pay | Admitting: Family Medicine

## 2015-03-04 ENCOUNTER — Ambulatory Visit (INDEPENDENT_AMBULATORY_CARE_PROVIDER_SITE_OTHER): Payer: Medicare Other | Admitting: Family Medicine

## 2015-03-04 VITALS — BP 128/75 | HR 87 | Temp 98.1°F | Wt 157.6 lb

## 2015-03-04 DIAGNOSIS — L989 Disorder of the skin and subcutaneous tissue, unspecified: Secondary | ICD-10-CM | POA: Diagnosis not present

## 2015-03-04 DIAGNOSIS — E1121 Type 2 diabetes mellitus with diabetic nephropathy: Secondary | ICD-10-CM

## 2015-03-04 LAB — POCT GLYCOSYLATED HEMOGLOBIN (HGB A1C): HEMOGLOBIN A1C: 6.7

## 2015-03-04 NOTE — Progress Notes (Signed)
Subjective: Ricky Garrett is a 75 y.o. Spanish-speaking male presenting for follow up of a skin lesion. A video Spanish interpretor is used throughout this visit.  He describes a small area on his left ear that has grown slowly over the past 2 months. It is itchy and he scratches it it very much. 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 This did not help. No other lesions.   He has well controlled diabetes treated with metformin. No change in sugars, no changes in vision, chest pain, dyspnea, leg or foot wounds. UTD on eye exam.   - ROS: No night sweats, fevers, weight loss.  - No personal or family history of skin CA. He has spent much time outside without sunscreen.   Objective: BP 128/75 mmHg  Pulse 87  Temp(Src) 98.1 F (36.7 C) (Oral)  Wt 157 lb 9.6 oz (71.487 kg) Gen: Pleasant, well-appearing 75 y.o. male in no distress Skin: Severely excoriated papular lesion on superior antihelix of left ear with pearly round margin and telangiectasia. Diameter is difficult to appreciate, less than 1 cm. No drainage or surrounding erythema. Full body exam reveals scattered sebK's, pinpoint cherry hemangiomas, and several dark nevi which he reports have been present since birth and have not grown. Lymph: No cervical or occipital lymphadenopathy.    Lab Results  Component Value Date   HGBA1C 6.7 03/04/2015   Assessment/Plan: Ricky Garrett is a 75 y.o. male here for a skin lesion on his left ear suspicious for BCC.  - Given its location with thin underlying cartilage, will refer to ENT for evaluation and biopsy.  - Discontinue topical steroid. - Cover wound for protection and to prevent further scratching.

## 2015-03-04 NOTE — Patient Instructions (Signed)
You will be called about a referral to ENT to have this place on your ear looked at.   Stop using triamcinolone cream and do not scratch the area. Put a bandage on it if you need to.

## 2015-03-25 ENCOUNTER — Telehealth: Payer: Self-pay | Admitting: Family Medicine

## 2015-03-25 NOTE — Telephone Encounter (Signed)
error 

## 2015-03-29 DIAGNOSIS — D492 Neoplasm of unspecified behavior of bone, soft tissue, and skin: Secondary | ICD-10-CM | POA: Diagnosis not present

## 2015-03-29 DIAGNOSIS — C44209 Unspecified malignant neoplasm of skin of left ear and external auricular canal: Secondary | ICD-10-CM | POA: Diagnosis not present

## 2015-03-29 DIAGNOSIS — H61892 Other specified disorders of left external ear: Secondary | ICD-10-CM | POA: Diagnosis not present

## 2015-03-29 DIAGNOSIS — C44229 Squamous cell carcinoma of skin of left ear and external auricular canal: Secondary | ICD-10-CM | POA: Diagnosis not present

## 2015-04-14 ENCOUNTER — Other Ambulatory Visit: Payer: Self-pay | Admitting: Family Medicine

## 2015-04-15 ENCOUNTER — Encounter: Payer: Self-pay | Admitting: Family Medicine

## 2015-04-16 DIAGNOSIS — C44229 Squamous cell carcinoma of skin of left ear and external auricular canal: Secondary | ICD-10-CM | POA: Diagnosis not present

## 2015-04-16 DIAGNOSIS — C44209 Unspecified malignant neoplasm of skin of left ear and external auricular canal: Secondary | ICD-10-CM | POA: Diagnosis not present

## 2015-04-22 ENCOUNTER — Encounter: Payer: Self-pay | Admitting: Family Medicine

## 2015-04-22 DIAGNOSIS — D696 Thrombocytopenia, unspecified: Secondary | ICD-10-CM

## 2015-04-22 DIAGNOSIS — I10 Essential (primary) hypertension: Secondary | ICD-10-CM

## 2015-04-22 MED ORDER — FLUTICASONE PROPIONATE 50 MCG/ACT NA SUSP: 1.0000 | Freq: Every day | NASAL | Status: DC

## 2015-04-22 MED ORDER — SIMVASTATIN 40 MG PO TABS: 40.0000 mg | ORAL_TABLET | Freq: Every day | ORAL | Status: DC

## 2015-04-22 MED ORDER — METFORMIN HCL 500 MG PO TABS: ORAL_TABLET | ORAL | Status: DC

## 2015-04-22 MED ORDER — OMEPRAZOLE 40 MG PO CPDR: 40.0000 mg | DELAYED_RELEASE_CAPSULE | Freq: Every day | ORAL | Status: DC

## 2015-04-22 MED ORDER — LISINOPRIL-HYDROCHLOROTHIAZIDE 20-25 MG PO TABS: 1.0000 | ORAL_TABLET | Freq: Every day | ORAL | Status: DC

## 2015-04-22 MED ORDER — TERAZOSIN HCL 1 MG PO CAPS: 1.0000 mg | ORAL_CAPSULE | Freq: Every day | ORAL | Status: DC

## 2015-04-28 DIAGNOSIS — H43811 Vitreous degeneration, right eye: Secondary | ICD-10-CM | POA: Diagnosis not present

## 2015-04-28 DIAGNOSIS — H2513 Age-related nuclear cataract, bilateral: Secondary | ICD-10-CM | POA: Diagnosis not present

## 2015-04-28 DIAGNOSIS — E119 Type 2 diabetes mellitus without complications: Secondary | ICD-10-CM | POA: Diagnosis not present

## 2015-04-28 LAB — HM DIABETES EYE EXAM

## 2015-05-04 ENCOUNTER — Telehealth: Payer: Self-pay | Admitting: Hematology and Oncology

## 2015-05-04 NOTE — Telephone Encounter (Signed)
Patient dtr called to r/s lab/NG from 4/11 to 4/14. Dtr has new date/time. Patient dtr has new date/time.

## 2015-05-11 ENCOUNTER — Ambulatory Visit: Payer: Medicare Other | Admitting: Hematology and Oncology

## 2015-05-11 ENCOUNTER — Other Ambulatory Visit: Payer: Medicare Other

## 2015-05-13 ENCOUNTER — Encounter: Payer: Self-pay | Admitting: Optometry

## 2015-05-14 ENCOUNTER — Encounter: Payer: Self-pay | Admitting: Hematology and Oncology

## 2015-05-14 ENCOUNTER — Other Ambulatory Visit (HOSPITAL_BASED_OUTPATIENT_CLINIC_OR_DEPARTMENT_OTHER): Payer: Medicare Other

## 2015-05-14 ENCOUNTER — Ambulatory Visit (HOSPITAL_BASED_OUTPATIENT_CLINIC_OR_DEPARTMENT_OTHER): Payer: Medicare Other | Admitting: Hematology and Oncology

## 2015-05-14 ENCOUNTER — Other Ambulatory Visit: Payer: Self-pay | Admitting: Hematology and Oncology

## 2015-05-14 VITALS — BP 124/81 | HR 66 | Temp 100.0°F | Resp 18 | Ht 65.0 in | Wt 162.3 lb

## 2015-05-14 DIAGNOSIS — D696 Thrombocytopenia, unspecified: Secondary | ICD-10-CM | POA: Diagnosis not present

## 2015-05-14 LAB — CBC WITH DIFFERENTIAL/PLATELET
BASO%: 0.6 % (ref 0.0–2.0)
Basophils Absolute: 0 10*3/uL (ref 0.0–0.1)
EOS ABS: 0.1 10*3/uL (ref 0.0–0.5)
EOS%: 2.7 % (ref 0.0–7.0)
HCT: 41.5 % (ref 38.4–49.9)
HGB: 14.2 g/dL (ref 13.0–17.1)
LYMPH%: 38.7 % (ref 14.0–49.0)
MCH: 30.3 pg (ref 27.2–33.4)
MCHC: 34.2 g/dL (ref 32.0–36.0)
MCV: 88.7 fL (ref 79.3–98.0)
MONO#: 0.4 10*3/uL (ref 0.1–0.9)
MONO%: 8.2 % (ref 0.0–14.0)
NEUT%: 49.8 % (ref 39.0–75.0)
NEUTROS ABS: 2.4 10*3/uL (ref 1.5–6.5)
Platelets: 107 10*3/uL — ABNORMAL LOW (ref 140–400)
RBC: 4.68 10*6/uL (ref 4.20–5.82)
RDW: 12.8 % (ref 11.0–14.6)
WBC: 4.7 10*3/uL (ref 4.0–10.3)
lymph#: 1.8 10*3/uL (ref 0.9–3.3)

## 2015-05-14 NOTE — Progress Notes (Signed)
[facility] OFFICE PROGRESS NOTE  Ricky Gather, MD SUMMARY OF HEMATOLOGIC HISTORY:  This is a patient who is followed here because of chronic thrombocytopenia. Previously H. pylori serology came back positive. The patient was asymptomatic and is just observed. INTERVAL HISTORY: Ricky Garrett 75 y.o. male returns for further follow-up. He feels well. The patient denies any recent signs or symptoms of bleeding such as spontaneous epistaxis, hematuria or hematochezia. He bruises easily because of aspirin therapy.  I have reviewed the past medical history, past surgical history, social history and family history with the patient and they are unchanged from previous note.  ALLERGIES:  has No Known Allergies.  MEDICATIONS:  Current Outpatient Prescriptions  Medication Sig Dispense Refill  . aspirin EC 81 MG tablet Take 1 tablet (81 mg total) by mouth daily. 100 tablet 6  . fluticasone (FLONASE) 50 MCG/ACT nasal spray Place 1 spray into both nostrils daily. 16 g 5  . lisinopril-hydrochlorothiazide (PRINZIDE,ZESTORETIC) 20-25 MG tablet Take 1 tablet by mouth daily. 90 tablet 3  . metFORMIN (GLUCOPHAGE) 500 MG tablet TAKE ONE TABLET BY MOUTH ONCE DAILY WITH BREAKFAST. 90 tablet 3  . Multiple Vitamins-Minerals (CENTRUM SILVER) tablet Take 1 tablet by mouth daily.    Marland Kitchen omeprazole (PRILOSEC) 40 MG capsule Take 1 capsule (40 mg total) by mouth daily. 90 capsule 0  . simvastatin (ZOCOR) 40 MG tablet Take 1 tablet (40 mg total) by mouth at bedtime. 90 tablet 3  . terazosin (HYTRIN) 1 MG capsule Take 1 capsule (1 mg total) by mouth at bedtime. 90 capsule 3   No current facility-administered medications for this visit.     REVIEW OF SYSTEMS:   Constitutional: Denies fevers, chills or night sweats Eyes: Denies blurriness of vision Ears, nose, mouth, throat, and face: Denies mucositis or sore throat Respiratory: Denies cough, dyspnea or wheezes Cardiovascular: Denies palpitation, chest  discomfort or lower extremity swelling Gastrointestinal:  Denies nausea, heartburn or change in bowel habits Skin: Denies abnormal skin rashes Lymphatics: Denies new lymphadenopathy  Neurological:Denies numbness, tingling or new weaknesses Behavioral/Psych: Mood is stable, no new changes  All other systems were reviewed with the patient and are negative.  PHYSICAL EXAMINATION: ECOG PERFORMANCE STATUS: 0 - Asymptomatic  Filed Vitals:   05/14/15 1213  BP: 124/81  Pulse: 66  Temp: 100 F (37.8 C)  Resp: 18   Filed Weights   05/14/15 1213  Weight: 162 lb 4.8 oz (73.619 kg)    GENERAL:alert, no distress and comfortable SKIN: skin color, texture, turgor are normal, no rashes or significant lesions. Noted bruising EYES: normal, Conjunctiva are pink and non-injected, sclera clear Musculoskeletal:no cyanosis of digits and no clubbing  NEURO: alert & oriented x 3 with fluent speech, no focal motor/sensory deficits  LABORATORY DATA:  I have reviewed the data as listed Results for orders placed or performed in visit on 05/14/15 (from the past 48 hour(s))  CBC with Differential/Platelet     Status: Abnormal   Collection Time: 05/14/15 11:58 AM  Result Value Ref Range   WBC 4.7 4.0 - 10.3 10e3/uL   NEUT# 2.4 1.5 - 6.5 10e3/uL   HGB 14.2 13.0 - 17.1 g/dL   HCT 41.5 38.4 - 49.9 %   Platelets 107 (L) 140 - 400 10e3/uL   MCV 88.7 79.3 - 98.0 fL   MCH 30.3 27.2 - 33.4 pg   MCHC 34.2 32.0 - 36.0 g/dL   RBC 4.68 4.20 - 5.82 10e6/uL   RDW 12.8 11.0 - 14.6 %  lymph# 1.8 0.9 - 3.3 10e3/uL   MONO# 0.4 0.1 - 0.9 10e3/uL   Eosinophils Absolute 0.1 0.0 - 0.5 10e3/uL   Basophils Absolute 0.0 0.0 - 0.1 10e3/uL   NEUT% 49.8 39.0 - 75.0 %   LYMPH% 38.7 14.0 - 49.0 %   MONO% 8.2 0.0 - 14.0 %   EOS% 2.7 0.0 - 7.0 %   BASO% 0.6 0.0 - 2.0 %    Lab Results  Component Value Date   WBC 4.7 05/14/2015   HGB 14.2 05/14/2015   HCT 41.5 05/14/2015   MCV 88.7 05/14/2015   PLT 107* 05/14/2015    ASSESSMENT & PLAN:  Thrombocytopenia ([place]) He is not symptomatic. Platelet count is improving spontaneously. I explained to the patient and family members that it is not uncommon for platelet count to fluctuate. 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 I told the patient and family members, as well as his prior count is well above 50,000, he does not require additional workup or treatment.  There is no contraindication to remain on aspirin as long as platelet count is greater than 50,000. I felt it is wasting the patient's time and co-pay just to come here on a yearly basis just to be monitored. I recommend he just follow with his PCP on a yearly basis and get the platelet count monitored through his PCPs office. Family members will call me for return appointment if his platelet counts start to trend closer to 50,000   All questions were answered. The patient knows to call the clinic with any problems, questions or concerns. No barriers to learning was detected.  I spent 15 minutes counseling the patient face to face. The total time spent in the appointment was 20 minutes and more than 50% was on counseling.     [facility], Lamondre Wesche, MD 4/14/201712:30 PM

## 2015-05-14 NOTE — Assessment & Plan Note (Signed)
He is not symptomatic. Platelet count is improving spontaneously. I explained to the patient and family members that it is not uncommon for platelet count to fluctuate. 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 I told the patient and family members, as well as his prior count is well above 50,000, he does not require additional workup or treatment.  There is no contraindication to remain on aspirin as long as platelet count is greater than 50,000. I felt it is wasting the patient's time and co-pay just to come here on a yearly basis just to be monitored. I recommend he just follow with his PCP on a yearly basis and get the platelet count monitored through his PCPs office. Family members will call me for return appointment if his platelet counts start to trend closer to 50,000

## 2015-07-02 ENCOUNTER — Ambulatory Visit (INDEPENDENT_AMBULATORY_CARE_PROVIDER_SITE_OTHER): Payer: Medicare Other | Admitting: Family Medicine

## 2015-07-02 VITALS — BP 121/71 | HR 74 | Temp 97.9°F | Wt 161.2 lb

## 2015-07-02 DIAGNOSIS — Z0289 Encounter for other administrative examinations: Secondary | ICD-10-CM | POA: Diagnosis present

## 2015-07-02 NOTE — Progress Notes (Signed)
Subjective: Ricky Garrett is a 75 y.o. male patient of mine presenting for form completion.   A Spanish video interpretor, Gio [id number], was used throughout the encounter.   He requires revision of the A999333 form, medical certification for disability exception. It is not feasible given his preexisting difficulties with concentration and advanced age to learn with competency the [place] language. The initial form was denied due to the presence of ICD codes. No changes have occurred in his health since the initial completion of the forms, and he denies any medical concerns.   Objective: BP 121/71 mmHg  Pulse 74  Temp(Src) 97.9 F (36.6 C) (Oral)  Wt 161 lb 3.2 oz (73.12 kg) Gen: Elderly, pleasant, well-appearing 75 y.o. male in no distress HEENT: PERRL, MMM, posterior oropharynx clear. Healing surgical scar on left pinna covered with antibiotic ointment.  Pulm: Non-labored; CTAB, no wheezes  CV: Regular rate, no murmur; distal pulses intact/symmetric; no LE edema GI: + BS; soft, non-tender, non-distended, no HSM MSK: Normal gait and station Neuro: CN II-XII without deficits, sensation intact to light touch, gait narrow based.  Psych: A&Ox3, mood euthymic with congruent affect, speech clear and coherent. Has some difficulty following complex commands.   Assessment/Plan: Ricky Garrett is a 75 y.o. male here for form completion.  Form A999333, Medical Certification for Disability Exceptions, was completed after the visit, and will be left in the front office for pick up. We will call the patient's family member, Ellamae Sia, per patient consent, at [phone] (cell).

## 2015-08-25 ENCOUNTER — Encounter: Payer: Self-pay | Admitting: Podiatry

## 2015-08-25 ENCOUNTER — Ambulatory Visit (INDEPENDENT_AMBULATORY_CARE_PROVIDER_SITE_OTHER): Payer: Medicare Other | Admitting: Podiatry

## 2015-08-25 VITALS — BP 112/83 | HR 74 | Resp 12

## 2015-08-25 DIAGNOSIS — E119 Type 2 diabetes mellitus without complications: Secondary | ICD-10-CM | POA: Diagnosis not present

## 2015-08-25 NOTE — Patient Instructions (Signed)
Diabetes y cuidados del pie (Diabetes and Foot Care) La diabetes puede ser la causa de que el flujo sanguneo (circulacin) en las piernas y los pies sea deficiente. Debido a esto, la piel de los pies se torna ms delgada, se rompe con facilidad y se cura ms lentamente. La piel puede estar seca, despellejarse y Medical illustrator. Tambin pueden estar daados los nervios de las piernas y de los pies lo que provoca una disminucin de la sensibilidad. Es posible que no advierta heridas ms pequeas en los pies, que pueden causar infecciones graves. Cuidar sus pies es una de las cosas ms importantes que puede hacer por usted mismo.  INSTRUCCIONES PARA EL CUIDADO EN EL HOGAR  Use siempre calzado, an dentro de su casa. No camine descalzo. Caminar descalzo facilita que se lastime.  Controle sus pies diariamente para observar ampollas, cortes y enrojecimiento. Si no puede ver la planta del pie, use un espejo o pdale ayuda a Nurse, children's.  Lave sus pies con agua tibia (no use agua caliente) y un [place]. Seque bien sus pies, y la zona [employer] dedos dando [place], hasta que estn completamente secos. Noremoje los pies, ya que esto puede resecar la piel.  Aplique una locin hidratante o vaselina (que no contenga alcohol ni perfume) en los pies y en las uas secas y [place]. No aplique locin entre los dedos.  Recorte las uas en forma recta. No escarbe debajo de las uas o alrededor [employer]. Lime los bordes de las uas con una lima o esmeril.  No corte las durezas o callosidades, ni trate de quitarlas con medicamentos.  Use calcetines de algodn o medias [employer]. Asegrese de que no le [employer]. Nouse calcetines que le lleguen a las rodillas, ya que podran disminuir el flujo de sangre a las piernas.  Use zapatos de cuero que le queden bien y que sean acolchados. Para amoldar los zapatos, clcelos slo algunas horas por da. Esto evitar lesiones en los pies.  Revise siempre los zapatos antes de ponerlos para asegurarse de que no haya objetos en su interior.  No cruce las piernas. Esto puede disminuir el flujo de sangre a los pies.  Si algo le ha raspado, cortado o lastimado la piel de los pies, mantenga la piel de esa zona limpia y [place]. Debe higienizar estas zonas con agua y un jabn suave. No limpie la zona con agua oxigenada, alcohol ni yodo.  Cuando se quite un vendaje adhesivo, asegrese de no daar la piel.  Si tiene una herida, obsrvela varias veces por da para asegurarse de que se est curando.  No use bolsas de agua caliente ni almohadillas trmicas. Podran causar quemaduras. Si ha perdido la sensibilidad en los pies o las piernas, no sabr lo que le est sucediendo hasta que sea demasiado tarde.  Asegrese de que su mdico le haga un examen completo de los pies por lo menos una vez al ao, o con ms frecuencia si usted tiene [employer]. Informe todos los cortes, llagas o moretones a su mdico inmediatamente. SOLICITE ATENCIN MDICA SI:   Tiene una lesin que no se cura.  Tiene cortes o rajaduras en la piel.  Tiene una ua encarnada.  Nota una zona irritada en las piernas o los pies.  Siente una sensacin de ardor u hormigueo en las piernas o los pies.  Siente dolor o calambres en las piernas o los pies.  Las piernas o los pies estn adormecidos.  Siente los pies siempre fros. SOLICITE ATENCIN MDICA DE INMEDIATO SI:   Presenta enrojecimiento, hinchazn o aumento del dolor en una herida.  Nota una lnea roja que sube por pierna.  Aparece pus en la herida.  Le sube la fiebre o segn lo que le indique el mdico.  Advierte un olor ftido que proviene de una lcera o una herida.   Esta informacin no tiene Marine scientist el consejo del mdico. Asegrese de hacerle al mdico cualquier pregunta que tenga.   Document Released: 01/16/2005 Document Revised: 09/18/2012 Elsevier Interactive Patient  Education [employer].  D

## 2015-08-25 NOTE — Progress Notes (Signed)
   Subjective:    Patient ID: Ricky Garrett, male    DOB: 20-Dec-1940, 75 y.o.   MRN: [id number]  HPI   This patient presents to our office today with his daughter present in the treatment room who is interpreting for her father who does not speak [place], Spanish only. He is a diabetic for approximately 10 years and denies any history of foot ulceration, claudication or amputation. Denies any previous visits to podiatrist office.    Review of Systems  Skin: Positive for color change.       Objective:   Physical Exam  Patient is orientated 3 and response to daughter's questioning and answers immediately  Vascular: No calf edema or calf tenderness bilaterally DP and PT pulses 2/4 bilaterally Capillary reflex immediate bilaterally  Neurological: Sensation to 10 g monofilament wire intact 5/5 bilaterally Vibratory sensation reactive bilaterally Ankle reflex equal and reactive bilaterally  Dermatological: No open skin lesions bilaterally Texture and turgor within normal limits bilaterally  Musculoskeletal: No deformities noted bilaterally There is no pain, crepitus, restriction of range of motion of the ankle, subtalar, midtarsal or metatarsophalangeal joints bilaterally      Assessment & Plan:   Assessment: Satisfactory neurovascular status Diabetic without foot complications  Plan: Today reviewed the results with patient and patient's daughter. I informed him that he was a diabetic without any complications. I provided a Spanish language after visit summary with diabetic foot care.  Recommended yearly diabetic foot examinations

## 2015-09-10 DIAGNOSIS — C44209 Unspecified malignant neoplasm of skin of left ear and external auricular canal: Secondary | ICD-10-CM | POA: Diagnosis not present

## 2015-09-16 ENCOUNTER — Encounter: Payer: Self-pay | Admitting: Student

## 2015-09-16 DIAGNOSIS — C4490 Unspecified malignant neoplasm of skin, unspecified: Secondary | ICD-10-CM | POA: Insufficient documentation

## 2015-11-09 DIAGNOSIS — Z23 Encounter for immunization: Secondary | ICD-10-CM | POA: Diagnosis not present

## 2016-01-05 ENCOUNTER — Encounter: Payer: Self-pay | Admitting: Student

## 2016-01-05 ENCOUNTER — Ambulatory Visit (INDEPENDENT_AMBULATORY_CARE_PROVIDER_SITE_OTHER): Payer: Medicare Other | Admitting: Student

## 2016-01-05 VITALS — BP 112/74 | HR 69 | Temp 97.9°F | Wt 160.4 lb

## 2016-01-05 DIAGNOSIS — Z23 Encounter for immunization: Secondary | ICD-10-CM

## 2016-01-05 DIAGNOSIS — E1121 Type 2 diabetes mellitus with diabetic nephropathy: Secondary | ICD-10-CM | POA: Diagnosis present

## 2016-01-05 DIAGNOSIS — E785 Hyperlipidemia, unspecified: Secondary | ICD-10-CM

## 2016-01-05 DIAGNOSIS — I10 Essential (primary) hypertension: Secondary | ICD-10-CM

## 2016-01-05 LAB — BASIC METABOLIC PANEL WITH GFR
BUN: 25 mg/dL (ref 7–25)
CALCIUM: 9 mg/dL (ref 8.6–10.3)
CHLORIDE: 102 mmol/L (ref 98–110)
CO2: 24 mmol/L (ref 20–31)
CREATININE: 0.84 mg/dL (ref 0.70–1.18)
GFR, Est African American: >89 mL/min (ref >=60)
GFR, Est Non African American: 86 mL/min (ref >=60)
GLUCOSE: 144 mg/dL — AB (ref 65–99)
Potassium: 3.7 mmol/L (ref 3.5–5.3)
Sodium: 140 mmol/L (ref 135–146)

## 2016-01-05 LAB — POCT GLYCOSYLATED HEMOGLOBIN (HGB A1C): Hemoglobin A1C: 6.8

## 2016-01-05 LAB — LIPID PANEL
Cholesterol: 140 mg/dL (ref <200)
HDL: 33 mg/dL — ABNORMAL LOW (ref >40)
LDL CALC: 32 mg/dL (ref <100)
TRIGLYCERIDES: 377 mg/dL — AB (ref <150)
Total CHOL/HDL Ratio: 4.2 Ratio (ref <5.0)
VLDL: 75 mg/dL — AB (ref <30)

## 2016-01-05 MED ORDER — ZOSTER VACCINE LIVE 19400 UNT/0.65ML ~~LOC~~ SUSR: 0.6500 mL | Freq: Once | SUBCUTANEOUS | 0 refills | Status: AC

## 2016-01-05 NOTE — Assessment & Plan Note (Signed)
Lipid panel today

## 2016-01-05 NOTE — Patient Instructions (Addendum)
It was great seeing you today! We have addressed the following issues today  1. Diabetes: A1c 6.8. Your goal A1c is less than 7.0. Continue taking the metformin. I also recommend dietary changes and daily exercise as below.  2.   Cholesterol: we will check her blood today. Continue taking your simvastatin for now.  3.   Please take a prescription for shingles vaccine to your pharmacy to get a shingles vaccine   If we did any lab work today, and the results require attention, either me or my nurse will get in touch with you. If everything is normal, you will get a letter in mail. If you don't hear from [place] in two weeks, please give [place] a call. Otherwise, we look forward to seeing you again at your next visit. If you have any questions or concerns before then, please call the clinic at [phone].   Please bring all your medications to every doctors visit   Sign up for My Chart to have easy access to your labs results, and communication with your Primary care physician.     Please check-out at the front desk before leaving the clinic.    Portion Size    Choose healthier foods such as 100% whole grains, vegetables, fruits, beans, nut seeds, olive oil, most vegetable oils, fat-free dietary, wild game and fish.   Avoid sweet tea, other sweetened beverages, soda, fruit juice, cold cereal and milk and trans fat.   Exercise at least 150 minutes per week, including weight resistance exercises 3 or 4 times per week.   Try to lose at least 7-10% of your current body weight.

## 2016-01-05 NOTE — Assessment & Plan Note (Signed)
Well controlled.  BMP today 

## 2016-01-05 NOTE — Assessment & Plan Note (Addendum)
Well controlled. A1c 6.8. We'll continue his metformin. Also discussed about dietary changes and exercise including portion size as in AVS. He is on ACE inhibitors, Statins and aspirin.  Pneumonia vaccination today Follow up in 6 months or sooner if needed.

## 2016-01-05 NOTE — Progress Notes (Signed)
   Subjective:    Patient ID: Ricky Garrett is a 75 y.o. old male. Video interpreter with [phone] was used for this encounter. Patient is accompanied to clinic by his daughter.  HPI Diabetes Mellitus Patient presents for follow up of diabetes. Current symptoms include: none. Patient denies foot ulcerations, increased appetite, paresthesia of the feet, polydipsia, polyuria, visual disturbances and weight loss.  Home sugars: patient does not check sugars. Current treatment: Continued metformin which has been effective. Last dilated eye exam: 04/28/2015. He walks his daughter's dog every day.   PMH: reviewed  SH: Denies smoking cigarettes, drinking alcohol or using recreational drugs. Walks his daughter's dog every day.   Review of Systems Per HPI Objective:   Vitals:   01/05/16 1347  BP: 112/74  Pulse: 69  Temp: 97.9 F (36.6 C)  TempSrc: Oral  SpO2: 94%  Weight: 160 lb 6.4 oz (72.8 kg)   GEN: appears well, no apparent distress. Oropharynx: mmm without erythema or exudation CVS: RRR, normal s1 and s2, no murmurs, no edema, DP pulses 2+ bilaterally RESP: no increased work of breathing, good air movement bilaterally GI: Bowel sounds present and normal SKIN: No apparent scaly lesion in his foot and between his toes. A seven point  monofilament exam within normal limits bilaterally. DP pulses 2+ bilaterally NEURO: alert and oriented appropriately, no gross defecits  PSYCH: appropriate mood and affect     Assessment & Plan:  Diabetes mellitus type 2, controlled Well controlled. A1c 6.8. We'll continue his metformin. Also discussed about dietary changes and exercise including portion size as in AVS. He is on ACE inhibitors, Statins and aspirin.  Pneumonia vaccination today Follow up in 6 months or sooner if needed.   Essential hypertension Well controlled. BMP today  Dyslipidemia Lipid panel today

## 2016-03-29 ENCOUNTER — Encounter (INDEPENDENT_AMBULATORY_CARE_PROVIDER_SITE_OTHER): Payer: Self-pay | Admitting: Physician Assistant

## 2016-03-29 ENCOUNTER — Ambulatory Visit (INDEPENDENT_AMBULATORY_CARE_PROVIDER_SITE_OTHER): Payer: Medicare Other | Admitting: Physician Assistant

## 2016-03-29 VITALS — BP 130/80 | HR 58 | Temp 97.5°F | Ht 64.5 in | Wt 159.2 lb

## 2016-03-29 DIAGNOSIS — E119 Type 2 diabetes mellitus without complications: Secondary | ICD-10-CM

## 2016-03-29 DIAGNOSIS — E785 Hyperlipidemia, unspecified: Secondary | ICD-10-CM | POA: Diagnosis not present

## 2016-03-29 DIAGNOSIS — I10 Essential (primary) hypertension: Secondary | ICD-10-CM

## 2016-03-29 DIAGNOSIS — D696 Thrombocytopenia, unspecified: Secondary | ICD-10-CM

## 2016-03-29 MED ORDER — FLUTICASONE PROPIONATE 50 MCG/ACT NA SUSP: 1.0000 | Freq: Every day | NASAL | 5 refills | Status: DC

## 2016-03-29 MED ORDER — TERAZOSIN HCL 1 MG PO CAPS: 1.0000 mg | ORAL_CAPSULE | Freq: Every day | ORAL | 3 refills | Status: DC

## 2016-03-29 MED ORDER — ASPIRIN EC 81 MG PO TBEC: 81.0000 mg | DELAYED_RELEASE_TABLET | Freq: Every day | ORAL | 6 refills | Status: DC

## 2016-03-29 MED ORDER — GEMFIBROZIL 600 MG PO TABS: 600.0000 mg | ORAL_TABLET | Freq: Two times a day (BID) | ORAL | 2 refills | Status: DC

## 2016-03-29 MED ORDER — LISINOPRIL-HYDROCHLOROTHIAZIDE 20-25 MG PO TABS: 1.0000 | ORAL_TABLET | Freq: Every day | ORAL | 3 refills | Status: DC

## 2016-03-29 MED ORDER — METFORMIN HCL 500 MG PO TABS: ORAL_TABLET | ORAL | 3 refills | Status: DC

## 2016-03-29 NOTE — Progress Notes (Signed)
Subjective:  Patient ID: Ricky Garrett, male    DOB: 1941-01-14  Age: 76 y.o. MRN: [id number]  CC: follow up diabetes and hypertension  HPI Ricky Garrett is a 76 y.o. male with a PMH of DM2, HTN, hyperlipidemia, BPH, and thrombocytopenia presents to establish new care for his DM2, HTN, and hyperlipidemia. Says his blood sugars have been well controlled on metformin alone. Last A1c 2 months ago, A1c 6.8. His blood pressure is also reportedly well controlled. His LDL is controlled but hypertriglyceridemia of 377 found 2 months ago. Does not endorse chest pain, sob, headache, abdominal pain, fever, chills, nausea, vomiting, rash, swelling, or GI/GU sxs.    Outpatient Medications Prior to Visit  Medication Sig Dispense Refill  . Multiple Vitamins-Minerals (CENTRUM SILVER) tablet Take 1 tablet by mouth daily.    Marland Kitchen aspirin EC 81 MG tablet Take 1 tablet (81 mg total) by mouth daily. 100 tablet 6  . fluticasone (FLONASE) 50 MCG/ACT nasal spray Place 1 spray into both nostrils daily. 16 g 5  . lisinopril-hydrochlorothiazide (PRINZIDE,ZESTORETIC) 20-25 MG tablet Take 1 tablet by mouth daily. 90 tablet 3  . metFORMIN (GLUCOPHAGE) 500 MG tablet TAKE ONE TABLET BY MOUTH ONCE DAILY WITH BREAKFAST. 90 tablet 3  . simvastatin (ZOCOR) 40 MG tablet Take 1 tablet (40 mg total) by mouth at bedtime. 90 tablet 3  . terazosin (HYTRIN) 1 MG capsule Take 1 capsule (1 mg total) by mouth at bedtime. 90 capsule 3   No facility-administered medications prior to visit.      ROS Review of Systems  Constitutional: Negative for chills, fever and malaise/fatigue.  Eyes: Negative for blurred vision.  Respiratory: Negative for shortness of breath.   Cardiovascular: Negative for chest pain and palpitations.  Gastrointestinal: Negative for abdominal pain and nausea.  Genitourinary: Negative for dysuria and hematuria.  Musculoskeletal: Negative for joint pain and myalgias.  Skin: Negative for rash.  Neurological:  Negative for tingling and headaches.  Psychiatric/Behavioral: Negative for depression. The patient is not nervous/anxious.     Objective:  BP 130/80   Pulse (!) 58   Temp 97.5 F (36.4 C) (Oral)   Ht 5' 4.5" (1.638 m)   Wt 159 lb 3.2 oz (72.2 kg)   SpO2 98%   BMI 26.90 kg/m   BP/Weight 03/29/2016 01/05/2016 [id number]  Systolic BP [id number]  Diastolic BP 80 74 83  Wt. (Lbs) 159.2 160.4 -  BMI 26.9 26.69 -      Physical Exam  Constitutional: He is oriented to person, place, and time.  Somewhat thin, somewhat frail, NAD, polite  HENT:  Head: Normocephalic and atraumatic.  Mouth/Throat: No oropharyngeal exudate.  Eyes: Conjunctivae and EOM are normal. No scleral icterus.  Neck: Normal range of motion. Neck supple. No thyromegaly present.  Cardiovascular: Normal rate, regular rhythm and normal heart sounds.   Pulmonary/Chest: Effort normal and breath sounds normal.  Abdominal: Soft. Bowel sounds are normal. He exhibits no distension and no mass. There is no tenderness. There is no guarding.  Genitourinary:  Genitourinary Comments: No hernia bilaterally, penis normal, testicles normal  Musculoskeletal: Normal range of motion. He exhibits no edema.  Neurological: He is alert and oriented to person, place, and time. No cranial nerve deficit. Coordination normal.  Monofilament testing for diabetic foot exam normal bilaterally.  Skin: Skin is warm and dry. No rash noted. No erythema. No pallor.  Psychiatric: He has a normal mood and affect. His behavior is normal. Thought content normal.  Vitals reviewed.  Assessment & Plan:   1. Essential hypertension - Controlled today but room for improvement. DASH diet information in spanish given to patient. - lisinopril-hydrochlorothiazide (PRINZIDE,ZESTORETIC) 20-25 MG tablet; Take 1 tablet by mouth daily.  Dispense: 90 tablet; Refill: 3  2. Type 2 diabetes mellitus without complication, without long-term current use of insulin  ([place]) - Diabetes Foot Exam normal today - A1c 6.8 on 01/05/16, next A1c in June.  3. Hyperlipidemia, unspecified hyperlipidemia type - Trigs 377 and LDL 32 on 01/05/16. Suspend Simvastatin for now and start Gemfibrozil. Will reassess lipid panel in 3 months.  4. Thrombocytopenia (HCC) - Plt 107K, 108K, and 107K over a two year time period. Smear from 11/20/2011 resulted in decreased plts and few variant lymphs. Rest of CBC parameters normal. Will monitor platelets on follow up in 3 months and send to oncology if plt count closer to 50,000.  *Colonoscopy 02/27/14 recommended another colonoscopy 5 years due to polyps. Next colonoscopy around 02/28/2019.  Meds ordered this encounter  Medications  . metFORMIN (GLUCOPHAGE) 500 MG tablet    Sig: TAKE ONE TABLET BY MOUTH ONCE DAILY WITH BREAKFAST.    Dispense:  90 tablet    Refill:  3    Order Specific Question:   Supervising Provider    Answer:   Tresa Garter [id number]  . lisinopril-hydrochlorothiazide (PRINZIDE,ZESTORETIC) 20-25 MG tablet    Sig: Take 1 tablet by mouth daily.    Dispense:  90 tablet    Refill:  3    Order Specific Question:   Supervising Provider    Answer:   Tresa Garter [id number]  . aspirin EC 81 MG tablet    Sig: Take 1 tablet (81 mg total) by mouth daily.    Dispense:  100 tablet    Refill:  6    Order Specific Question:   Supervising Provider    Answer:   Tresa Garter [id number]  . gemfibrozil (LOPID) 600 MG tablet    Sig: Take 1 tablet (600 mg total) by mouth 2 (two) times daily before a meal.    Dispense:  60 tablet    Refill:  2    Order Specific Question:   Supervising Provider    Answer:   Tresa Garter [id number]  . fluticasone (FLONASE) 50 MCG/ACT nasal spray    Sig: Place 1 spray into both nostrils daily.    Dispense:  16 g    Refill:  5    Order Specific Question:   Supervising Provider    Answer:   Tresa Garter [id number]  . terazosin (HYTRIN) 1 MG capsule    Sig:  Take 1 capsule (1 mg total) by mouth at bedtime.    Dispense:  90 capsule    Refill:  3    Order Specific Question:   Supervising Provider    Answer:   Tresa Garter [id number]    Follow-up: Return in about 3 months (around 07/05/2016).   Clent Demark PA

## 2016-03-29 NOTE — Patient Instructions (Signed)
Plan de alimentacin DASH (DASH Eating Plan) DASH es la sigla en ingls de "Enfoques Alimentarios para Detener la Hipertensin". El plan de alimentacin DASH ha demostrado bajar la presin arterial elevada (hipertensin). Los beneficios adicionales para la salud pueden incluir la disminucin del riesgo de diabetes mellitus tipo2, enfermedades cardacas e ictus. Este plan tambin puede ayudar a adelgazar. QU DEBO SABER ACERCA DEL PLAN DE ALIMENTACIN DASH? Para el plan de alimentacin DASH, seguir las siguientes pautas generales:  Elija los alimentos que contienen menos de 150 miligramos de sodio por porcin (segn se indica en la etiqueta de los alimentos).  Use hierbas o aderezos sin sal, en lugar de sal de mesa o sal marina.  Consulte al mdico o farmacutico antes de usar sustitutos de la sal.  Consuma los productos con menor contenido de sodio. Estos productos suelen estar etiquetados como "bajo en sodio" o "sin agregado de sal".  Coma alimentos frescos. No consuma una gran cantidad de alimentos enlatados.  Coma ms verduras, frutas y productos lcteos con bajo contenido de grasas.  Elija los cereales integrales. Busque la palabra "integral" en el primer lugar de la lista de ingredientes.  Elija el pescado y el pollo o el pavo sin piel ms a menudo que las carnes rojas. Limite el consumo de pescado, carne de ave y carne a 6onzas (170g) por da.  Limite el consumo de dulces, postres, azcares y bebidas azucaradas.  Elija las grasas saludables para el corazn.  Consuma ms comida casera y menos de restaurante, de buf y comida rpida.  Limite el consumo de alimentos fritos.  No fra los alimentos. A la hora de cocinarlos, opte por hornearlos, hervirlos, grillarlos y asarlos a la parrilla.  Cuando coma en un restaurante, pida que preparen su comida con menos sal o, en lo posible, sin nada de sal. QU ALIMENTOS PUEDO COMER? Pida ayuda a un nutricionista para conocer las  necesidades calricas individuales. Cereales Pan de salvado o integral. Arroz integral. Pastas de salvado o integrales. Quinua, trigo burgol y cereales integrales. Cereales con bajo contenido de sodio. Tortillas de harina de maz o de salvado. Pan de maz integral. Galletas saladas integrales. Galletas con bajo contenido de sodio. Vegetales Verduras frescas o congeladas (crudas, al vapor, asadas o grilladas). Jugos de tomate y verduras con contenido bajo o reducido de sodio. Pasta y salsa de tomate con contenido bajo o reducido de sodio. Verduras enlatadas con bajo contenido de sodio o reducido de sodio. Frutas Frutas frescas, en conserva (en su jugo natural) o frutas congeladas. Carnes y otros productos con protenas Carne de res molida (al 85% o ms magra), carne de res de animales alimentados con pastos o carne de res sin la grasa. Pollo o pavo sin piel. Carne de pollo o de pavo molida. Cerdo sin la grasa. Todos los pescados y frutos de mar. Huevos. Porotos, guisantes o lentejas secos. Frutos secos y semillas sin sal. Frijoles enlatados sin sal. Lcteos Productos lcteos con bajo contenido de grasas, como leche descremada o al 1%, quesos reducidos en grasas o al 2%, ricota con bajo contenido de grasas o queso cottage, o yogur natural con bajo contenido de grasas. Quesos con contenido bajo o reducido de sodio. Grasas y aceites Margarinas en barra que no contengan grasas trans. Mayonesa y alios para ensaladas livianos o reducidos en grasas (reducidos en sodio). [place]. Aceites de crtamo, oliva o canola. Mantequilla natural de man o almendra. Otros Palomitas de maz y pretzels sin sal. Los artculos mencionados arriba pueden no   ser una lista completa de las bebidas o los alimentos recomendados. Comunquese con el nutricionista para conocer ms opciones. QU ALIMENTOS NO SE RECOMIENDAN? Cereales Pan blanco. Pastas blancas. Arroz blanco. Pan de maz refinado. Bagels y croissants. Galletas  saladas que contengan grasas trans. Vegetales Vegetales con crema o fritos. Verduras en salsa de queso. Verduras enlatadas comunes. Pasta y salsa de tomate en lata comunes. Jugos comunes de tomate y de verduras. Frutas Fruta enlatada en almbar liviano o espeso. Jugo de frutas. Carnes y otros productos con protenas Cortes de carne con grasa. Costillas, alas de pollo, tocineta, salchicha, mortadela, salame, chinchulines, tocino, perros calientes, salchichas alemanas y embutidos envasados. Frutos secos y semillas con sal. Frijoles con sal en lata. Lcteos Leche entera o al 2%, crema, mezcla de leche y crema, y queso crema. Yogur entero o endulzado. Quesos o queso azul con alto contenido de grasas. Cremas no lcteas y coberturas batidas. Quesos procesados, quesos para untar o cuajadas. Condimentos Sal de cebolla y ajo, sal condimentada, sal de mesa y sal marina. Salsas en lata y envasadas. Salsa Worcestershire. Salsa trtara. Salsa barbacoa. Salsa teriyaki. Salsa de soja, incluso la que tiene contenido reducido de sodio. Salsa de carne. Salsa de pescado. Salsa de ostras. Salsa rosada. Rbano picante. Ketchup y mostaza. Saborizantes y tiernizantes para carne. Caldo en cubitos. Salsa picante. Salsa tabasco. Adobos. Aderezos para tacos. Salsas. Grasas y aceites Mantequilla, margarina en barra, manteca de cerdo, grasa, mantequilla clarificada y grasa de tocino. Aceites de coco, de palmiste o de palma. Aderezos comunes para ensalada. Otros Pickles y aceitunas. Palomitas de maz y pretzels con sal. Los artculos mencionados arriba pueden no ser una lista completa de las bebidas y los alimentos que se deben evitar. Comunquese con el nutricionista para obtener ms informacin. DNDE PUEDO ENCONTRAR MS INFORMACIN? Instituto Nacional del Corazn, del Pulmn y de la Sangre (National Heart, Lung, and Blood Institute): www.nhlbi.nih.gov/health/health-topics/topics/dash/ Esta informacin no tiene como fin  reemplazar el consejo del mdico. Asegrese de hacerle al mdico cualquier pregunta que tenga. Document Released: 01/05/2011 Document Revised: 05/10/2015 Document Reviewed: 11/20/2012 Elsevier Interactive Patient Education  2017 Elsevier Inc.  

## 2016-05-03 ENCOUNTER — Encounter: Payer: Self-pay | Admitting: Physician Assistant

## 2016-05-03 DIAGNOSIS — E119 Type 2 diabetes mellitus without complications: Secondary | ICD-10-CM | POA: Diagnosis not present

## 2016-05-03 DIAGNOSIS — H43811 Vitreous degeneration, right eye: Secondary | ICD-10-CM | POA: Diagnosis not present

## 2016-05-03 DIAGNOSIS — H2513 Age-related nuclear cataract, bilateral: Secondary | ICD-10-CM | POA: Diagnosis not present

## 2016-05-03 LAB — HM DIABETES EYE EXAM

## 2016-05-12 ENCOUNTER — Other Ambulatory Visit (INDEPENDENT_AMBULATORY_CARE_PROVIDER_SITE_OTHER): Payer: Self-pay | Admitting: Physician Assistant

## 2016-05-12 ENCOUNTER — Encounter (INDEPENDENT_AMBULATORY_CARE_PROVIDER_SITE_OTHER): Payer: Self-pay | Admitting: Physician Assistant

## 2016-05-12 DIAGNOSIS — Z760 Encounter for issue of repeat prescription: Secondary | ICD-10-CM | POA: Insufficient documentation

## 2016-05-12 MED ORDER — TERAZOSIN HCL 1 MG PO CAPS: 1.0000 mg | ORAL_CAPSULE | Freq: Every day | ORAL | 1 refills | Status: DC

## 2016-05-12 MED ORDER — METFORMIN HCL 500 MG PO TABS: ORAL_TABLET | ORAL | 1 refills | Status: DC

## 2016-05-12 NOTE — Progress Notes (Signed)
Medication refill requested 

## 2016-07-04 ENCOUNTER — Ambulatory Visit (INDEPENDENT_AMBULATORY_CARE_PROVIDER_SITE_OTHER): Payer: Medicare Other | Admitting: Physician Assistant

## 2016-07-04 ENCOUNTER — Encounter (INDEPENDENT_AMBULATORY_CARE_PROVIDER_SITE_OTHER): Payer: Self-pay | Admitting: Physician Assistant

## 2016-07-04 VITALS — BP 115/72 | HR 53 | Temp 97.6°F | Wt 159.8 lb

## 2016-07-04 DIAGNOSIS — I10 Essential (primary) hypertension: Secondary | ICD-10-CM | POA: Diagnosis not present

## 2016-07-04 DIAGNOSIS — Z760 Encounter for issue of repeat prescription: Secondary | ICD-10-CM | POA: Diagnosis not present

## 2016-07-04 DIAGNOSIS — E119 Type 2 diabetes mellitus without complications: Secondary | ICD-10-CM | POA: Diagnosis not present

## 2016-07-04 DIAGNOSIS — D696 Thrombocytopenia, unspecified: Secondary | ICD-10-CM

## 2016-07-04 DIAGNOSIS — E785 Hyperlipidemia, unspecified: Secondary | ICD-10-CM

## 2016-07-04 DIAGNOSIS — L821 Other seborrheic keratosis: Secondary | ICD-10-CM

## 2016-07-04 LAB — POCT GLYCOSYLATED HEMOGLOBIN (HGB A1C): HEMOGLOBIN A1C: 6.8

## 2016-07-04 MED ORDER — TERAZOSIN HCL 1 MG PO CAPS: 1.0000 mg | ORAL_CAPSULE | Freq: Every day | ORAL | 1 refills | Status: DC

## 2016-07-04 MED ORDER — LISINOPRIL-HYDROCHLOROTHIAZIDE 20-25 MG PO TABS: 1.0000 | ORAL_TABLET | Freq: Every day | ORAL | 3 refills | Status: DC

## 2016-07-04 MED ORDER — METFORMIN HCL 500 MG PO TABS: ORAL_TABLET | ORAL | 1 refills | Status: DC

## 2016-07-04 MED ORDER — ASPIRIN EC 81 MG PO TBEC: 81.0000 mg | DELAYED_RELEASE_TABLET | Freq: Every day | ORAL | 6 refills | Status: DC

## 2016-07-04 NOTE — Patient Instructions (Signed)
Queratosis seborreica (Seborrheic Keratosis) La queratosis seborreica es un trastorno frecuente y no canceroso (benigno) que se caracteriza por la presencia de tumores cutneos. Esta afeccin hace que aparezcan manchas cerosas, speras, marrones o negras en la piel. Estos tumores cutneos pueden ser planos o tener relieve. CAUSAS Se desconoce la causa de este trastorno. FACTORES DE RIESGO Es ms probable que esta afeccin se manifieste en:  Las personas que tienen antecedentes familiares de queratosis seborreica.  [id number].  [place].  Las personas que han recibido un tratamiento de reposicin con Therapist, occupational. SNTOMAS Esta afeccin a menudo se presenta en la cara, el pecho, los hombros, la espalda u otras reas. Estos tumores presentan las siguientes caractersticas:  Por lo general, son indoloros, pero pueden irritarse y Engineer, agricultural.  Pueden ser de [employer], [place], negro, o de otros colores.  Tienen un poco de relieve o son planos.  A veces, son speros o tienen una textura similar a la de [employer] fast streamer.  A menudo, tienen un aspecto seroso en la superficie.  Son redondos u [place].  A veces, se ven como si estuviesen "pegados".  Suelen aparecer en grupos, pero pueden crecer de forma individual. DIAGNSTICO Esta afeccin se diagnostica mediante la historia clnica y un examen fsico. Puede analizarse Truddie Coco de un tumor (biopsia de piel). Tal vez haya que consultar a un especialista en piel (dermatlogo). TRATAMIENTO Por lo general, esta afeccin no requiere tratamiento, excepto si los tumores se irritan o sangran con frecuencia. Si no le gusta el aspecto de estos tumores, puede solicitarle al mdico que se los extirpe. Con frecuencia, estos tumores se tratan mediante un procedimiento en el que se aplica nitrgeno lquido para "congelar" el tumor (criociruga). Tambin pueden quemarse con electricidad o extirparse. INSTRUCCIONES PARA EL CUIDADO EN EL  HOGAR  Controle los tumores para ver si hay cambios.  Concurra a todas las visitas de control como se lo haya indicado el mdico. Esto es importante.  No se rasque ni toquetee el tumor o los tumores. Esto puede hacer que se irriten o infecten.  SOLICITE ATENCIN MDICA SI:  Le aparecen muchos tumores nuevos de forma repentina.  El tumor sangra, le pica o le duele.  El tumor se agranda o cambia de color de forma repentina.  Esta informacin no tiene Marine scientist el consejo del mdico. Asegrese de hacerle al mdico cualquier pregunta que tenga. Document Released: 09/28/2010 Document Revised: 10/07/2014 Document Reviewed: 06/03/2014 Elsevier Interactive Patient Education  2017 [employer].

## 2016-07-04 NOTE — Progress Notes (Signed)
Subjective:  Patient ID: Ricky Garrett, male    DOB: Jun 07, 1940  Age: 76 y.o. MRN: [id number]  CC: f/u HTN and DM  HPI Ricky Garrett is a 76 y.o. male with a PMH of HTN, HLD, DM2, and thrombocytopenia presents on f/u of these chronic conditions. States he feels well and has no symptoms or complaints. Would like a refill of his medications.    Outpatient Medications Prior to Visit  Medication Sig Dispense Refill  . fluticasone (FLONASE) 50 MCG/ACT nasal spray Place 1 spray into both nostrils daily. 16 g 5  . gemfibrozil (LOPID) 600 MG tablet Take 1 tablet (600 mg total) by mouth 2 (two) times daily before a meal. 60 tablet 2  . Multiple Vitamins-Minerals (CENTRUM SILVER) tablet Take 1 tablet by mouth daily.    Marland Kitchen aspirin EC 81 MG tablet Take 1 tablet (81 mg total) by mouth daily. 100 tablet 6  . lisinopril-hydrochlorothiazide (PRINZIDE,ZESTORETIC) 20-25 MG tablet Take 1 tablet by mouth daily. 90 tablet 3  . metFORMIN (GLUCOPHAGE) 500 MG tablet TAKE ONE TABLET BY MOUTH ONCE DAILY WITH BREAKFAST. 90 tablet 1  . terazosin (HYTRIN) 1 MG capsule Take 1 capsule (1 mg total) by mouth at bedtime. 90 capsule 1   No facility-administered medications prior to visit.      ROS Review of Systems  Constitutional: Negative for chills, fever and malaise/fatigue.  Eyes: Negative for blurred vision.  Respiratory: Negative for shortness of breath.   Cardiovascular: Negative for chest pain and palpitations.  Gastrointestinal: Negative for abdominal pain and nausea.  Genitourinary: Negative for dysuria and hematuria.  Musculoskeletal: Negative for joint pain and myalgias.  Skin: Negative for rash.  Neurological: Negative for tingling and headaches.  Psychiatric/Behavioral: Negative for depression. The patient is not nervous/anxious.     Objective:  BP 115/72 (BP Location: Left Arm, Patient Position: Sitting, Cuff Size: Normal)   Pulse (!) 53   Temp 97.6 F (36.4 C) (Oral)   Wt 159 lb 12.8 oz (72.5  kg)   SpO2 93%   BMI 27.01 kg/m   BP/Weight 07/04/2016 03/29/2016 40/09/8117  Systolic BP 147 829 562  Diastolic BP 72 80 74  Wt. (Lbs) 159.8 159.2 160.4  BMI 27.01 26.9 26.69      Physical Exam  Constitutional: He is oriented to person, place, and time.  Well developed, well nourished, NAD, polite  HENT:  Head: Normocephalic and atraumatic.  Eyes: No scleral icterus.  Neck: Normal range of motion. Neck supple. No thyromegaly present.  Cardiovascular: Normal rate, regular rhythm and normal heart sounds.   Pulmonary/Chest: Effort normal and breath sounds normal.  Abdominal: Soft. Bowel sounds are normal. There is no tenderness.  Musculoskeletal: He exhibits no edema.  Lymphadenopathy:    He has no cervical adenopathy.  Neurological: He is alert and oriented to person, place, and time. No cranial nerve deficit. Coordination normal.  Skin: Skin is warm and dry. No rash noted. No erythema. No pallor.  Irregularly shaped, hyperpigmented, and rough textured lesions near his left and right temple.   Psychiatric: He has a normal mood and affect. His behavior is normal. Thought content normal.  Vitals reviewed.    Assessment & Plan:   1. Essential hypertension - Hepatic function panel  2. Type 2 diabetes mellitus without complication, without long-term current use of insulin (HCC) - HgB A1c - Hepatic function panel  3. Dyslipidemia - Lipid panel - Hepatic function panel  4. Thrombocytopenia (HCC) - CBC with Differential - Hepatic function panel  5. Seborrheic keratosis - Ambulatory referral to Dermatology  6. Medication refill - terazosin (HYTRIN) 1 MG capsule; Take 1 capsule (1 mg total) by mouth at bedtime.  Dispense: 90 capsule; Refill: 1 - metFORMIN (GLUCOPHAGE) 500 MG tablet; TAKE ONE TABLET BY MOUTH ONCE DAILY WITH BREAKFAST.  Dispense: 90 tablet; Refill: 1 - lisinopril-hydrochlorothiazide (PRINZIDE,ZESTORETIC) 20-25 MG tablet; Take 1 tablet by mouth daily.   Dispense: 90 tablet; Refill: 3 - aspirin EC 81 MG tablet; Take 1 tablet (81 mg total) by mouth daily.  Dispense: 100 tablet; Refill: 6   Meds ordered this encounter  Medications  . terazosin (HYTRIN) 1 MG capsule    Sig: Take 1 capsule (1 mg total) by mouth at bedtime.    Dispense:  90 capsule    Refill:  1    Order Specific Question:   Supervising Provider    Answer:   Tresa Garter [id number]  . metFORMIN (GLUCOPHAGE) 500 MG tablet    Sig: TAKE ONE TABLET BY MOUTH ONCE DAILY WITH BREAKFAST.    Dispense:  90 tablet    Refill:  1    Order Specific Question:   Supervising Provider    Answer:   Tresa Garter [id number]  . lisinopril-hydrochlorothiazide (PRINZIDE,ZESTORETIC) 20-25 MG tablet    Sig: Take 1 tablet by mouth daily.    Dispense:  90 tablet    Refill:  3    Order Specific Question:   Supervising Provider    Answer:   Tresa Garter [id number]  . aspirin EC 81 MG tablet    Sig: Take 1 tablet (81 mg total) by mouth daily.    Dispense:  100 tablet    Refill:  6    Order Specific Question:   Supervising Provider    Answer:   Tresa Garter [[id number]]    Follow-up: Return in about 6 months (around 01/03/2017) for f/u DM.   Clent Demark PA

## 2016-07-05 ENCOUNTER — Other Ambulatory Visit (INDEPENDENT_AMBULATORY_CARE_PROVIDER_SITE_OTHER): Payer: Self-pay | Admitting: Physician Assistant

## 2016-07-05 DIAGNOSIS — E785 Hyperlipidemia, unspecified: Secondary | ICD-10-CM

## 2016-07-05 LAB — CBC WITH DIFFERENTIAL/PLATELET
BASOS ABS: 0 10*3/uL (ref 0.0–0.2)
Basos: 0 %
EOS (ABSOLUTE): 0.1 10*3/uL (ref 0.0–0.4)
Eos: 2 %
Hematocrit: 40.7 % (ref 37.5–51.0)
Hemoglobin: 13.8 g/dL (ref 13.0–17.7)
Immature Grans (Abs): 0 10*3/uL (ref 0.0–0.1)
Immature Granulocytes: 0 %
LYMPHS ABS: 1.4 10*3/uL (ref 0.7–3.1)
Lymphs: 34 %
MCH: 30 pg (ref 26.6–33.0)
MCHC: 33.9 g/dL (ref 31.5–35.7)
MCV: 89 fL (ref 79–97)
MONOCYTES: 5 %
MONOS ABS: 0.2 10*3/uL (ref 0.1–0.9)
NEUTROS PCT: 59 %
Neutrophils Absolute: 2.4 10*3/uL (ref 1.4–7.0)
Platelets: 119 10*3/uL — ABNORMAL LOW (ref 150–379)
RBC: 4.6 x10E6/uL (ref 4.14–5.80)
RDW: 13.9 % (ref 12.3–15.4)
WBC: 4 10*3/uL (ref 3.4–10.8)

## 2016-07-05 LAB — HEPATIC FUNCTION PANEL
ALT: 19 IU/L (ref 0–44)
AST: 21 IU/L (ref 0–40)
Albumin: 4.8 g/dL (ref 3.5–4.8)
Alkaline Phosphatase: 71 IU/L (ref 39–117)
Bilirubin Total: 0.4 mg/dL (ref 0.0–1.2)
Bilirubin, Direct: 0.1 mg/dL (ref 0.00–0.40)
TOTAL PROTEIN: 7.1 g/dL (ref 6.0–8.5)

## 2016-07-05 LAB — LIPID PANEL
CHOL/HDL RATIO: 5.2 ratio — AB (ref 0.0–5.0)
Cholesterol, Total: 220 mg/dL — ABNORMAL HIGH (ref 100–199)
HDL: 42 mg/dL (ref >39)
LDL Calculated: 149 mg/dL — ABNORMAL HIGH (ref 0–99)
Triglycerides: 145 mg/dL (ref 0–149)
VLDL CHOLESTEROL CAL: 29 mg/dL (ref 5–40)

## 2016-07-05 MED ORDER — LOVASTATIN 20 MG PO TABS: 20.0000 mg | ORAL_TABLET | Freq: Every day | ORAL | 3 refills | Status: DC

## 2016-07-05 NOTE — Progress Notes (Signed)
Patient with elevated LDL. Trigs are now under control. Pt's daughter instructed to tell patient to stop Gemfibrozil and start Lovastatin. Additionally, these instructions are also put into the "pharmacy instructions" section of the rx on Epic.

## 2016-07-07 ENCOUNTER — Ambulatory Visit (INDEPENDENT_AMBULATORY_CARE_PROVIDER_SITE_OTHER): Payer: Medicare Other | Admitting: Podiatry

## 2016-07-07 ENCOUNTER — Encounter: Payer: Self-pay | Admitting: Podiatry

## 2016-07-07 DIAGNOSIS — B351 Tinea unguium: Secondary | ICD-10-CM | POA: Diagnosis not present

## 2016-07-07 DIAGNOSIS — E119 Type 2 diabetes mellitus without complications: Secondary | ICD-10-CM | POA: Diagnosis not present

## 2016-07-11 NOTE — Progress Notes (Signed)
Subjective: 76 year old male presents the office for yearly diabetic foot evaluation. He states that his only concern is he has noticed some mild discoloration still discoloration to his big toenails. Denies any pain in the nails and denies any surrounding redness or drainage. He denies any open sores. He denies any claudication symptoms he denies any numbness or tingling. He has no concerns otherwise today. Denies any systemic complaints such as fevers, chills, nausea, vomiting. No acute changes since last appointment, and no other complaints at this time.   Objective: AAO x3, NAD; presents with daughter translates. DP/PT pulses palpable 2/4 bilaterally, CRT less than 3 seconds sensation appears to be intact [employer] monofilament. Hallux toe nails may be mildly dystrophic, discolored, hypertrophic with yellow to brown discoloration. There is no pain of the toenails there is no ascending redness or drainage. No open lesions or pre-ulcerative lesions.  No pain with calf compression, swelling, warmth, erythema  Assessment: 76 year old male with onychomycosis bilateral hallux. Presents for diabetic foot evaluation.   Plan: -All treatment options discussed with the patient including all alternatives, risks, complications.  -Discussed treatment options for onychomycosis. He wishes to proceed with topical. However compound cream through [facility] for this.  -Overall he is doing well to his feet. He has no concerns otherwise. No open sores. Discussed daily foot inspection. -Follow-up in 1 year sooner if needed. -Patient encouraged to call the office with any questions, concerns, change in symptoms.   Celesta Gentile, DPM

## 2016-08-16 ENCOUNTER — Ambulatory Visit: Payer: Medicare Other | Admitting: Podiatry

## 2016-08-25 DIAGNOSIS — D485 Neoplasm of uncertain behavior of skin: Secondary | ICD-10-CM | POA: Diagnosis not present

## 2016-08-25 DIAGNOSIS — D2372 Other benign neoplasm of skin of left lower limb, including hip: Secondary | ICD-10-CM | POA: Diagnosis not present

## 2016-08-25 DIAGNOSIS — D225 Melanocytic nevi of trunk: Secondary | ICD-10-CM | POA: Diagnosis not present

## 2016-08-25 DIAGNOSIS — D2261 Melanocytic nevi of right upper limb, including shoulder: Secondary | ICD-10-CM | POA: Diagnosis not present

## 2016-08-25 DIAGNOSIS — L814 Other melanin hyperpigmentation: Secondary | ICD-10-CM | POA: Diagnosis not present

## 2016-08-25 DIAGNOSIS — D2371 Other benign neoplasm of skin of right lower limb, including hip: Secondary | ICD-10-CM | POA: Diagnosis not present

## 2016-08-25 DIAGNOSIS — C44212 Basal cell carcinoma of skin of right ear and external auricular canal: Secondary | ICD-10-CM | POA: Diagnosis not present

## 2016-08-25 DIAGNOSIS — D1801 Hemangioma of skin and subcutaneous tissue: Secondary | ICD-10-CM | POA: Diagnosis not present

## 2016-08-25 DIAGNOSIS — L821 Other seborrheic keratosis: Secondary | ICD-10-CM | POA: Diagnosis not present

## 2016-08-25 DIAGNOSIS — D2262 Melanocytic nevi of left upper limb, including shoulder: Secondary | ICD-10-CM | POA: Diagnosis not present

## 2016-08-25 DIAGNOSIS — L570 Actinic keratosis: Secondary | ICD-10-CM | POA: Diagnosis not present

## 2016-09-19 ENCOUNTER — Other Ambulatory Visit (INDEPENDENT_AMBULATORY_CARE_PROVIDER_SITE_OTHER): Payer: Self-pay | Admitting: Physician Assistant

## 2016-09-19 DIAGNOSIS — Z760 Encounter for issue of repeat prescription: Secondary | ICD-10-CM

## 2016-09-28 DIAGNOSIS — C44212 Basal cell carcinoma of skin of right ear and external auricular canal: Secondary | ICD-10-CM | POA: Diagnosis not present

## 2016-09-28 DIAGNOSIS — Z85828 Personal history of other malignant neoplasm of skin: Secondary | ICD-10-CM | POA: Diagnosis not present

## 2016-10-03 ENCOUNTER — Other Ambulatory Visit (INDEPENDENT_AMBULATORY_CARE_PROVIDER_SITE_OTHER): Payer: Self-pay | Admitting: Physician Assistant

## 2016-10-06 NOTE — Telephone Encounter (Signed)
Patient refill request -

## 2016-10-10 DIAGNOSIS — Z23 Encounter for immunization: Secondary | ICD-10-CM | POA: Diagnosis not present

## 2017-01-30 DIAGNOSIS — C44209 Unspecified malignant neoplasm of skin of left ear and external auricular canal: Secondary | ICD-10-CM

## 2017-01-30 HISTORY — DX: Unspecified malignant neoplasm of skin of left ear and external auricular canal: C44.209

## 2017-02-23 ENCOUNTER — Encounter (INDEPENDENT_AMBULATORY_CARE_PROVIDER_SITE_OTHER): Payer: Self-pay | Admitting: Physician Assistant

## 2017-02-23 ENCOUNTER — Ambulatory Visit (INDEPENDENT_AMBULATORY_CARE_PROVIDER_SITE_OTHER): Payer: Medicare Other | Admitting: Physician Assistant

## 2017-02-23 VITALS — BP 132/82 | HR 60 | Temp 97.6°F | Wt 160.2 lb

## 2017-02-23 DIAGNOSIS — I10 Essential (primary) hypertension: Secondary | ICD-10-CM

## 2017-02-23 DIAGNOSIS — Z760 Encounter for issue of repeat prescription: Secondary | ICD-10-CM | POA: Diagnosis not present

## 2017-02-23 DIAGNOSIS — Z2911 Encounter for prophylactic immunotherapy for respiratory syncytial virus (RSV): Secondary | ICD-10-CM | POA: Diagnosis not present

## 2017-02-23 DIAGNOSIS — E119 Type 2 diabetes mellitus without complications: Secondary | ICD-10-CM | POA: Diagnosis not present

## 2017-02-23 DIAGNOSIS — E7841 Elevated Lipoprotein(a): Secondary | ICD-10-CM

## 2017-02-23 DIAGNOSIS — K219 Gastro-esophageal reflux disease without esophagitis: Secondary | ICD-10-CM | POA: Diagnosis not present

## 2017-02-23 DIAGNOSIS — Z23 Encounter for immunization: Secondary | ICD-10-CM

## 2017-02-23 LAB — POCT GLYCOSYLATED HEMOGLOBIN (HGB A1C): Hemoglobin A1C: 8.9

## 2017-02-23 MED ORDER — RANITIDINE HCL 150 MG PO TABS: 150.0000 mg | ORAL_TABLET | Freq: Two times a day (BID) | ORAL | 5 refills | Status: DC

## 2017-02-23 MED ORDER — LISINOPRIL-HYDROCHLOROTHIAZIDE 20-25 MG PO TABS: 1.0000 | ORAL_TABLET | Freq: Every day | ORAL | 3 refills | Status: DC

## 2017-02-23 MED ORDER — TERAZOSIN HCL 1 MG PO CAPS: 1.0000 mg | ORAL_CAPSULE | Freq: Every day | ORAL | 3 refills | Status: DC

## 2017-02-23 MED ORDER — ASPIRIN EC 81 MG PO TBEC: 81.0000 mg | DELAYED_RELEASE_TABLET | Freq: Every day | ORAL | 6 refills | Status: DC

## 2017-02-23 MED ORDER — OMEPRAZOLE 40 MG PO CPDR: 40.0000 mg | DELAYED_RELEASE_CAPSULE | Freq: Every day | ORAL | 5 refills | Status: DC

## 2017-02-23 MED ORDER — METFORMIN HCL ER 500 MG PO TB24: 1000.0000 mg | ORAL_TABLET | Freq: Every day | ORAL | 3 refills | Status: DC

## 2017-02-23 NOTE — Progress Notes (Addendum)
Subjective:  Patient ID: Ricky Garrett, male    DOB: July 06, 1940  Age: 77 y.o. MRN: [id number]  CC: f/u DM and HTN  HPI  Ricky Garrett is a 77 y.o. male with a PMH of HTN, HLD, DM2, and thrombocytopenia presents on f/u for DM and HTN.  A1c 6.8% seven months ago. A1c 8.9% in clinic today. [place] and reports dietary indiscretion. Takes medications as directed. Endorse loose stools with Metformin. Also complains of acid reflux.  Does not endorse polyuria, polydipsia, polyphagia, visual blurring, tingling, or numbness.     Blood pressure is excellent today. Takes medications as directed. Does not endorse CP, palpitations, SOB, HA, presyncope, syncope, PND, orthopnea, claudication, LE swelling, f/c/n/v, rash, or GU sxs.    Outpatient Medications Prior to Visit  Medication Sig Dispense Refill  . lisinopril-hydrochlorothiazide (PRINZIDE,ZESTORETIC) 20-25 MG tablet Take 1 tablet by mouth daily. 90 tablet 3  . lovastatin (MEVACOR) 20 MG tablet Take 1 tablet (20 mg total) by mouth at bedtime. Suspend Gemfibrozil when taking this medication. 90 tablet 3  . metFORMIN (GLUCOPHAGE) 500 MG tablet TAKE ONE TABLET BY MOUTH ONCE DAILY WITH BREAKFAST. 90 tablet 1  . terazosin (HYTRIN) 1 MG capsule Take 1 capsule (1 mg total) by mouth at bedtime. 90 capsule 1  . aspirin EC 81 MG tablet Take 1 tablet (81 mg total) by mouth daily. 100 tablet 6  . fluticasone (FLONASE) 50 MCG/ACT nasal spray Place 1 spray into both nostrils daily. 16 g 5  . gemfibrozil (LOPID) 600 MG tablet Take 1 tablet (600 mg total) by mouth 2 (two) times daily before a meal. 60 tablet 2  . Multiple Vitamins-Minerals (CENTRUM SILVER) tablet Take 1 tablet by mouth daily.    . NONFORMULARY OR COMPOUNDED ITEM [facility]  Onychomycosis Nail Lacquer -  Fluconazole 2%, Terbinafine 1% DMSO Apply to affected nail once daily Qty. 120 gm 3 refills     No facility-administered medications prior to visit.      ROS Review of  Systems  Constitutional: Negative for chills, fever and malaise/fatigue.  Eyes: Negative for blurred vision.  Respiratory: Negative for shortness of breath.   Cardiovascular: Negative for chest pain and palpitations.  Gastrointestinal: Positive for abdominal pain and heartburn. Negative for nausea.       Loose stools  Genitourinary: Negative for dysuria and hematuria.  Musculoskeletal: Negative for joint pain and myalgias.  Skin: Negative for rash.  Neurological: Negative for tingling and headaches.  Psychiatric/Behavioral: Negative for depression. The patient is not nervous/anxious.     Objective:  There were no vitals taken for this visit.  BP/Weight 07/04/2016 03/29/2016 40/09/8117  Systolic BP 147 829 562  Diastolic BP 72 80 74  Wt. (Lbs) 159.8 159.2 160.4  BMI 27.01 26.9 26.69      Physical Exam  Constitutional: He is oriented to person, place, and time.  Well developed, well nourished, NAD, polite  HENT:  Head: Normocephalic and atraumatic.  Eyes: No scleral icterus.  Neck: Normal range of motion. Neck supple. No thyromegaly present.  Cardiovascular: Normal rate, regular rhythm and normal heart sounds.  Pulmonary/Chest: Effort normal and breath sounds normal.  Abdominal: Soft. Bowel sounds are normal. There is tenderness (mild epigastric TTP).  Musculoskeletal: He exhibits no edema.  Neurological: He is alert and oriented to person, place, and time. No cranial nerve deficit. Coordination normal.  Skin: Skin is warm and dry. No rash noted. No erythema. No pallor.  Psychiatric: He has a normal  mood and affect. His behavior is normal. Thought content normal.  Vitals reviewed.    Assessment & Plan:   1. Type 2 diabetes mellitus without complication, without long-term current use of insulin (HCC) - HgB A1c 8.9% in clinic today. Advised to control diet.  - CBC with Differential - Comprehensive metabolic panel - Begin Metformin 500 mg ER, two tabs qday  2.  Gastroesophageal reflux disease, esophagitis presence not specified - omeprazole (PRILOSEC) 40 MG capsule; Take 1 capsule (40 mg total) by mouth daily.  Dispense: 30 capsule; Refill: 5 - ranitidine (ZANTAC) 150 MG tablet; Take 1 tablet (150 mg total) by mouth 2 (two) times daily.  Dispense: 60 tablet; Refill: 5  3. Medication refill - terazosin (HYTRIN) 1 MG capsule; Take 1 capsule (1 mg total) by mouth at bedtime.  Dispense: 90 capsule; Refill: 3 - lisinopril-hydrochlorothiazide (PRINZIDE,ZESTORETIC) 20-25 MG tablet; Take 1 tablet by mouth daily.  Dispense: 90 tablet; Refill: 3 - aspirin EC 81 MG tablet; Take 1 tablet (81 mg total) by mouth daily.  Dispense: 100 tablet; Refill: 6  4. Hypertension, unspecified type - terazosin (HYTRIN) 1 MG capsule; Take 1 capsule (1 mg total) by mouth at bedtime.  Dispense: 90 capsule; Refill: 3 - lisinopril-hydrochlorothiazide (PRINZIDE,ZESTORETIC) 20-25 MG tablet; Take 1 tablet by mouth daily.  Dispense: 90 tablet; Refill: 3  5. Need for shingles vaccine - Varicella-zoster vaccine subcutaneous  6. Elevated lipoprotein(a) - Lipid panel   Meds ordered this encounter  Medications  . omeprazole (PRILOSEC) 40 MG capsule    Sig: Take 1 capsule (40 mg total) by mouth daily.    Dispense:  30 capsule    Refill:  5    Order Specific Question:   Supervising Provider    Answer:   Tresa Garter [id number]  . ranitidine (ZANTAC) 150 MG tablet    Sig: Take 1 tablet (150 mg total) by mouth 2 (two) times daily.    Dispense:  60 tablet    Refill:  5    Order Specific Question:   Supervising Provider    Answer:   Tresa Garter [id number]  . terazosin (HYTRIN) 1 MG capsule    Sig: Take 1 capsule (1 mg total) by mouth at bedtime.    Dispense:  90 capsule    Refill:  3    Order Specific Question:   Supervising Provider    Answer:   Tresa Garter [id number]  . lisinopril-hydrochlorothiazide (PRINZIDE,ZESTORETIC) 20-25 MG tablet    Sig: Take 1  tablet by mouth daily.    Dispense:  90 tablet    Refill:  3    Order Specific Question:   Supervising Provider    Answer:   Tresa Garter [id number]  . aspirin EC 81 MG tablet    Sig: Take 1 tablet (81 mg total) by mouth daily.    Dispense:  100 tablet    Refill:  6    Order Specific Question:   Supervising Provider    Answer:   Tresa Garter [id number]  . metFORMIN (GLUCOPHAGE XR) 500 MG 24 hr tablet    Sig: Take 2 tablets (1,000 mg total) by mouth daily with breakfast.    Dispense:  180 tablet    Refill:  3    Order Specific Question:   Supervising Provider    Answer:   Tresa Garter [[id number]]    Follow-up: Return in about 3 months (around 05/24/2017) for diabetes.   Shardae Kleinman Roderic Ovens PA    *  CMA reported that she injected the patient with a recently expired vaccine. I have called the patient to notify him and to call us if he has any symptoms. He stated that he is well and not feeling any symptoms at the moment. I have had CMA report the incident to the "safety portal page". She has also reviewed and labeled all the expiration dates on the remaining vaccines in clinic.

## 2017-02-23 NOTE — Patient Instructions (Signed)
Diabetes mellitus y nutrición  Diabetes Mellitus and Nutrition  Si sufre de diabetes (diabetes mellitus), es muy importante tener hábitos alimenticios saludables debido a que sus niveles de azúcar en la sangre (glucosa) se ven afectados en gran medida por lo que come y bebe. [place]er alimentos saludables en las cantidades adecuadas, aproximadamente a la misma hora todos los días, lo ayudará a:  · Controlar la glucemia.  · Disminuir el riesgo de sufrir una enfermedad cardíaca.  · Mejorar la presión arterial.  · Alcanzar o mantener un peso saludable.    Todas las personas que sufren de diabetes son diferentes y cada una tiene necesidades diferentes en cuanto a un plan de alimentación. El médico puede recomendarle que trabaje con un especialista en dietas y nutrición (nutricionista) para elaborar el mejor plan para usted. Su plan de alimentación puede variar según factores como:  · Las calorías que necesita.  · Los medicamentos que toma.  · Su peso.  · Sus niveles de glucemia, presión arterial y colesterol.  · Su nivel de actividad.  · Otras afecciones que tenga, como enfermedades cardíacas o renales.    ¿Cómo me afectan los carbohidratos?  Los carbohidratos afectan el nivel de glucemia más que cualquier otro tipo de alimento. La ingesta de carbohidratos naturalmente aumenta la cantidad glucosa en la sangre. El recuento de carbohidratos es un método destinado a llevar un registro de la cantidad de carbohidratos que se ingieren. El recuento de carbohidratos es importante para mantener la glucemia a un nivel saludable, en especial si utiliza insulina o toma determinados medicamentos por vía oral para la diabetes.  Es importante saber la cantidad de carbohidratos que se pueden ingerir en cada comida sin correr ningún riesgo. Esto es diferente en cada persona. El nutricionista puede ayudarlo a calcular la cantidad de carbohidratos que debe ingerir en cada comida y colación.   Los alimentos que contienen carbohidratos incluyen:  · Pan, cereal, arroz, pasta y galletas.  · Papas y maíz.  · Guisantes, frijoles y lentejas.  · Leche y yogur.  · Frutas y jugo.  · Postres, como pasteles, galletitas, helado y caramelos.    ¿Cómo me afecta el alcohol?  El alcohol puede provocar disminuciones súbitas de la glucemia (hipoglucemia), en especial si utiliza insulina o toma determinados medicamentos por vía oral para la diabetes. La hipoglucemia es una afección potencialmente mortal. Los síntomas de la hipoglucemia (somnolencia, mareos y confusión) son similares a los síntomas de haber consumido demasiado alcohol.  Si el médico afirma que el alcohol es seguro para usted, siga estas pautas:  · Limite el consumo de alcohol a no más de 1 medida por día si es mujer y no está embarazada, y a 2 medidas si es hombre. Una medida equivale a 12 oz (355 ml) de cerveza, 5 oz (148 ml) de vino o 1½ oz (44 ml) de bebidas de alta graduación alcohólica.  · No beba con el estómago vacío.  · Manténgase hidratado con agua, gaseosas dietéticas o té helado sin azúcar.  · Tenga en cuenta que las gaseosas comunes, los jugos y otros refrescos pueden contener mucha azúcar y se deben contar como carbohidratos.    Consejos para seguir este plan  Leer las etiquetas de los alimentos  · Comience por controlar el tamaño de la porción en la etiqueta. La cantidad de calorías, carbohidratos, grasas y otros nutrientes mencionados en la etiqueta se basan en una porción del alimento. Muchos alimentos contienen más de una porción por envase.  · Verifique la cantidad total de gramos (g)   de carbohidratos totales en una porción. Puede calcular la cantidad de porciones de carbohidratos al dividir el total de carbohidratos por 15. Por ejemplo, si un alimento posee un total de 30 g de carbohidratos, equivale a 2 porciones de carbohidratos.  · Verifique la cantidad de gramos (g) de grasas saturadas y grasas trans  en una porción. Escoja alimentos que no contengan grasa o que tengan un bajo contenido.  · Controle la cantidad de miligramos (mg) de sodio en una porción. La mayoría de las personas deben limitar la ingesta de sodio total a menos de 2300 mg por día.  · Siempre consulte la información nutricional de los alimentos etiquetados como “con bajo contenido de grasa” o “sin grasa”. Estos alimentos pueden ser más altos en azúcar agregada o en carbohidratos refinados y deben evitarse.  · Hable con el nutricionista para identificar sus objetivos diarios en cuanto a los nutrientes mencionados en la etiqueta.  De compras  · Evite comprar alimentos procesados, enlatados o prehechos. Estos alimentos tienden a tener mayor cantidad de grasa, sodio y azúcar agregada.  · Compre en la zona exterior de la tienda de comestibles. Esta incluye frutas y vegetales frescos, granos a granel, carnes frescas y productos lácteos frescos.  Cocción  · Utilice métodos de cocción a baja temperatura, como hornear, en lugar de métodos de cocción a alta temperatura, como freír en abundante aceite.  · Cocine con aceites saludables, como el aceite de oliva, canola o girasol.  · Evite cocinar con manteca, crema o carnes con alto contenido de grasa.  Planificación de las comidas  · Consuma las comidas y las colaciones de forma regular, preferentemente a la misma hora todos los días. Evite pasar largos períodos de tiempo sin comer.  · Consuma alimentos ricos en fibra, como frutas frescas, verduras, frijoles y cereales integrales. Consulte al nutricionista sobre cuántas porciones de carbohidratos puede consumir en cada comida.  · Consuma entre 4 y 6 onzas de proteínas magras por día, como carnes magras, pollo, pescado, huevos o tofu. 1 onza equivale a 1 onza de carne, pollo o pescado, 1 huevo, o 1/4 taza de tofu.  · Coma algunos alimentos por día que contengan grasas saludables, como aguacates, frutos secos, semillas y pescado.  Estilo de vida     · Controle su nivel de glucemia con regularidad.  · Haga ejercicio al menos 30 minutos, 5 días o más por semana, o como se lo haya indicado el médico.  · Tome los medicamentos como se lo haya indicado el médico.  · No consuma ningún producto que contenga nicotina o tabaco, como cigarrillos y cigarrillos electrónicos. Si necesita ayuda para dejar de fumar, consulte al médico.  · Trabaje con un asesor o instructor en diabetes para identificar estrategias para controlar el estrés y cualquier desafío emocional y social.  ¿Cuáles son algunas de las preguntas que puedo hacerle a mi médico?  · ¿Es necesario que me reúna con un instructor en diabetes?  · ¿Es necesario que me reúna con un nutricionista?  · ¿A qué número puedo llamar si tengo preguntas?  · ¿Cuáles son los mejores momentos para controlar la glucemia?  Dónde encontrar más información:  · Asociación Americana de la Diabetes (American Diabetes Association): diabetes.org/food-and-fitness/food  · Academia de Nutrición y Dietética (Academy of Nutrition and Dietetics): www.eatright.org/resources/health/diseases-and-conditions/diabetes  · Instituto Nacional de la Diabetes y las Enfermedades Digestivas y Renales (National Institute of Diabetes and Digestive and Kidney Diseases) (Institutos Nacionales de Salud, NIH): www.niddk.nih.gov/health-information/diabetes/overview/diet-eating-physical-activity  Resumen  · Un plan de alimentación saludable   lo ayudará a controlar la glucemia y mantener un estilo de vida saludable.  · Trabajar con un especialista en dietas y nutrición (nutricionista) puede ayudarlo a elaborar el mejor plan de alimentación para usted.  · Tenga en cuenta que los carbohidratos y el alcohol tienen efectos inmediatos en sus niveles de glucemia. Es importante contar los carbohidratos y consumir alcohol con prudencia.  Esta información no tiene como fin reemplazar el consejo del médico. Asegúrese de hacerle al médico cualquier pregunta que tenga.   Document Released: 04/25/2007 Document Revised: 05/08/2016 Document Reviewed: 05/08/2016  Elsevier Interactive Patient Education © 2018 Elsevier Inc.

## 2017-02-24 LAB — LIPID PANEL
CHOLESTEROL TOTAL: 178 mg/dL (ref 100–199)
Chol/HDL Ratio: 4.7 ratio (ref 0.0–5.0)
HDL: 38 mg/dL — AB (ref >39)
LDL Calculated: 74 mg/dL (ref 0–99)
Triglycerides: 328 mg/dL — ABNORMAL HIGH (ref 0–149)
VLDL Cholesterol Cal: 66 mg/dL — ABNORMAL HIGH (ref 5–40)

## 2017-02-24 LAB — CBC WITH DIFFERENTIAL/PLATELET
BASOS ABS: 0 10*3/uL (ref 0.0–0.2)
BASOS: 0 %
EOS (ABSOLUTE): 0.1 10*3/uL (ref 0.0–0.4)
Eos: 3 %
Hematocrit: 44.7 % (ref 37.5–51.0)
Hemoglobin: 15.1 g/dL (ref 13.0–17.7)
IMMATURE GRANS (ABS): 0 10*3/uL (ref 0.0–0.1)
Immature Granulocytes: 0 %
LYMPHS ABS: 1.4 10*3/uL (ref 0.7–3.1)
LYMPHS: 32 %
MCH: 29.8 pg (ref 26.6–33.0)
MCHC: 33.8 g/dL (ref 31.5–35.7)
MCV: 88 fL (ref 79–97)
MONOS ABS: 0.3 10*3/uL (ref 0.1–0.9)
Monocytes: 7 %
NEUTROS ABS: 2.6 10*3/uL (ref 1.4–7.0)
Neutrophils: 58 %
PLATELETS: 102 10*3/uL — AB (ref 150–379)
RBC: 5.06 x10E6/uL (ref 4.14–5.80)
RDW: 13.4 % (ref 12.3–15.4)
WBC: 4.4 10*3/uL (ref 3.4–10.8)

## 2017-02-24 LAB — COMPREHENSIVE METABOLIC PANEL
A/G RATIO: 2 (ref 1.2–2.2)
ALK PHOS: 84 IU/L (ref 39–117)
ALT: 26 IU/L (ref 0–44)
AST: 23 IU/L (ref 0–40)
Albumin: 4.7 g/dL (ref 3.5–4.8)
BILIRUBIN TOTAL: 0.5 mg/dL (ref 0.0–1.2)
BUN/Creatinine Ratio: 18 (ref 10–24)
BUN: 16 mg/dL (ref 8–27)
CHLORIDE: 101 mmol/L (ref 96–106)
CO2: 26 mmol/L (ref 20–29)
Calcium: 9.6 mg/dL (ref 8.6–10.2)
Creatinine, Ser: 0.89 mg/dL (ref 0.76–1.27)
GFR calc non Af Amer: 83 mL/min/{1.73_m2} (ref >59)
GFR, EST AFRICAN AMERICAN: 96 mL/min/{1.73_m2} (ref >59)
GLUCOSE: 171 mg/dL — AB (ref 65–99)
Globulin, Total: 2.3 g/dL (ref 1.5–4.5)
POTASSIUM: 4.5 mmol/L (ref 3.5–5.2)
Sodium: 142 mmol/L (ref 134–144)
TOTAL PROTEIN: 7 g/dL (ref 6.0–8.5)

## 2017-02-26 ENCOUNTER — Other Ambulatory Visit (INDEPENDENT_AMBULATORY_CARE_PROVIDER_SITE_OTHER): Payer: Self-pay | Admitting: Physician Assistant

## 2017-02-26 DIAGNOSIS — E781 Pure hyperglyceridemia: Secondary | ICD-10-CM | POA: Insufficient documentation

## 2017-02-26 MED ORDER — FENOFIBRATE 145 MG PO TABS: 145.0000 mg | ORAL_TABLET | Freq: Every day | ORAL | 1 refills | Status: DC

## 2017-03-01 ENCOUNTER — Telehealth (INDEPENDENT_AMBULATORY_CARE_PROVIDER_SITE_OTHER): Payer: Self-pay | Admitting: *Deleted

## 2017-03-01 NOTE — Telephone Encounter (Signed)
Medical Assistant used [employer] to contact patient.  Interpreter Name: Azell Der #: [id number] Patient was not available, [employer] left patient a voicemail. Voicemail states to give a call back to [place] with [facility] at [phone]. Patient is aware of triglycerides being elevated and taking fenofibrate. Patient will not take lovastatin while take fenofibrate.

## 2017-03-01 NOTE — Telephone Encounter (Signed)
-----   Message from Clent Demark, PA-C sent at 02/26/2017  8:53 AM EST ----- Triglycerides are elevated. I will start fenofibrate. I have notified his daughter [name] to inform patient to suspend Lovastatin while taking fenofibrate.

## 2017-03-02 DIAGNOSIS — D225 Melanocytic nevi of trunk: Secondary | ICD-10-CM | POA: Diagnosis not present

## 2017-03-02 DIAGNOSIS — L570 Actinic keratosis: Secondary | ICD-10-CM | POA: Diagnosis not present

## 2017-03-02 DIAGNOSIS — D2372 Other benign neoplasm of skin of left lower limb, including hip: Secondary | ICD-10-CM | POA: Diagnosis not present

## 2017-03-02 DIAGNOSIS — Z85828 Personal history of other malignant neoplasm of skin: Secondary | ICD-10-CM | POA: Diagnosis not present

## 2017-03-02 DIAGNOSIS — D2371 Other benign neoplasm of skin of right lower limb, including hip: Secondary | ICD-10-CM | POA: Diagnosis not present

## 2017-03-02 DIAGNOSIS — L821 Other seborrheic keratosis: Secondary | ICD-10-CM | POA: Diagnosis not present

## 2017-03-02 DIAGNOSIS — D1801 Hemangioma of skin and subcutaneous tissue: Secondary | ICD-10-CM | POA: Diagnosis not present

## 2017-05-25 DIAGNOSIS — E119 Type 2 diabetes mellitus without complications: Secondary | ICD-10-CM | POA: Diagnosis not present

## 2017-05-25 DIAGNOSIS — H2513 Age-related nuclear cataract, bilateral: Secondary | ICD-10-CM | POA: Diagnosis not present

## 2017-05-25 DIAGNOSIS — H43811 Vitreous degeneration, right eye: Secondary | ICD-10-CM | POA: Diagnosis not present

## 2017-05-25 LAB — HM DIABETES EYE EXAM

## 2017-06-01 ENCOUNTER — Ambulatory Visit (INDEPENDENT_AMBULATORY_CARE_PROVIDER_SITE_OTHER): Payer: Medicare Other | Admitting: Physician Assistant

## 2017-06-04 ENCOUNTER — Ambulatory Visit (INDEPENDENT_AMBULATORY_CARE_PROVIDER_SITE_OTHER): Payer: Medicare Other | Admitting: Physician Assistant

## 2017-06-04 ENCOUNTER — Encounter (INDEPENDENT_AMBULATORY_CARE_PROVIDER_SITE_OTHER): Payer: Self-pay | Admitting: Physician Assistant

## 2017-06-04 VITALS — BP 110/69 | HR 59 | Temp 97.4°F | Resp 18 | Ht 65.0 in | Wt 147.0 lb

## 2017-06-04 DIAGNOSIS — E119 Type 2 diabetes mellitus without complications: Secondary | ICD-10-CM | POA: Diagnosis not present

## 2017-06-04 DIAGNOSIS — Z760 Encounter for issue of repeat prescription: Secondary | ICD-10-CM

## 2017-06-04 DIAGNOSIS — I10 Essential (primary) hypertension: Secondary | ICD-10-CM | POA: Diagnosis not present

## 2017-06-04 DIAGNOSIS — E781 Pure hyperglyceridemia: Secondary | ICD-10-CM | POA: Diagnosis not present

## 2017-06-04 LAB — POCT GLYCOSYLATED HEMOGLOBIN (HGB A1C): HEMOGLOBIN A1C: 6

## 2017-06-04 LAB — POCT CBG (FASTING - GLUCOSE)-MANUAL ENTRY: Glucose Fasting, POC: 127 mg/dL — AB (ref 70–99)

## 2017-06-04 MED ORDER — LISINOPRIL-HYDROCHLOROTHIAZIDE 20-25 MG PO TABS: 1.0000 | ORAL_TABLET | Freq: Every day | ORAL | 3 refills | Status: DC

## 2017-06-04 MED ORDER — ASPIRIN EC 81 MG PO TBEC
81.0000 mg | DELAYED_RELEASE_TABLET | Freq: Every day | ORAL | 6 refills | Status: DC
Start: 2017-06-04 — End: 2017-10-31

## 2017-06-04 MED ORDER — TERAZOSIN HCL 1 MG PO CAPS: 1.0000 mg | ORAL_CAPSULE | Freq: Every day | ORAL | 3 refills | Status: DC

## 2017-06-04 MED ORDER — RANITIDINE HCL 150 MG PO TABS: 150.0000 mg | ORAL_TABLET | Freq: Two times a day (BID) | ORAL | 5 refills | Status: DC

## 2017-06-04 MED ORDER — LOVASTATIN 20 MG PO TABS: 20.0000 mg | ORAL_TABLET | Freq: Every day | ORAL | 3 refills | Status: DC

## 2017-06-04 MED ORDER — METFORMIN HCL ER 500 MG PO TB24
1000.0000 mg | ORAL_TABLET | Freq: Every day | ORAL | 3 refills | Status: DC
Start: 2017-06-04 — End: 2017-10-31

## 2017-06-04 MED ORDER — CENTRUM SILVER PO TABS: 1.0000 | ORAL_TABLET | Freq: Every day | ORAL | Status: AC

## 2017-06-04 NOTE — Patient Instructions (Signed)
Gastritis en los adultos (Gastritis, Adult) La gastritis es la irritacin del estmago. Hay dos tipos de gastritis:  Gastritis aguda. Este tipo aparece de manera repentina.  Gastritis crnica. Este tipo dura mucho tiempo. La gastritis aparece cuando la membrana que recubre el estmago se debilita o se daa. Sin tratamiento, la gastritis puede causar hemorragias y lceras estomacales. CAUSAS Esta afeccin puede ser causada por lo siguiente:  Una infeccin.  Beber alcohol en exceso.  Ciertos medicamentos.  Tener demasiada cantidad de cido en el estmago.  Una enfermedad de los intestinos o del estmago.  Estrs. SNTOMAS Los sntomas de esta afeccin incluyen lo siguiente:  Dolor o ardor en la parte superior del abdomen.  Nuseas.  Vmitos.  Sensacin molesta de distensin despus de comer. En algunos casos no hay sntomas. DIAGNSTICO Esta afeccin se puede diagnosticar a travs de lo siguiente:  Una descripcin de los sntomas.  Un examen fsico.  Estudios. Estos pueden incluir los siguientes: ? Anlisis de sangre. ? Pruebas de materia fecal. ? Una prueba en la que se introduce un instrumento delgado y flexible que tiene una luz y una cmara en la punta a travs del esfago y hacia el estmago (endoscopia superior). ? Una prueba en la que se toma una muestra de tejido para analizarlo (biopsia). TRATAMIENTO Esta afeccin puede tratarse con medicamentos. Si la afeccin es causada por una infeccin bacteriana, pueden darle antibiticos. Si es causada por demasiada cantidad de cido en el estmago, pueden darle medicamentos llamados bloqueadores H2, inhibidores de la bomba de protones o anticidos. El tratamiento tambin puede incluir la suspensin del uso de ciertos medicamentos, como la aspirina, el ibuprofeno u otros antiinflamatorios no esteroides (AINE). INSTRUCCIONES PARA EL CUIDADO EN EL HOGAR  Tome los medicamentos de venta libre y los recetados solamente como se  lo haya indicado el mdico.  Si le recetaron un antibitico, tmelo como se lo haya indicado el mdico. No deje de tomar los antibiticos aunque comience a sentirse mejor.  Beba suficiente lquido para mantener la orina clara o de color amarillo plido.  Haga varias comidas pequeas y frecuentes durante el da en lugar de comidas abundantes. SOLICITE ATENCIN MDICA SI:  Los sntomas empeoran.  Los sntomas regresan despus del tratamiento. SOLICITE ATENCIN MDICA DE INMEDIATO SI:  Vomita sangre de color rojo brillante o una sustancia similar a los granos de caf.  La materia fecal es negra o de color rojo oscuro.  No puede retener los lquidos.  El dolor abdominal empeora.  Tiene fiebre.  No mejora luego de 1 semana. Esta informacin no tiene como fin reemplazar el consejo del mdico. Asegrese de hacerle al mdico cualquier pregunta que tenga. Document Released: 10/26/2004 Document Revised: 10/07/2014 Document Reviewed: 10/10/2014 Elsevier Interactive Patient Education  2018 Elsevier Inc.  

## 2017-06-04 NOTE — Progress Notes (Signed)
Subjective:  Patient ID: Ricky Garrett, male    DOB: 1941-01-24  Age: 77 y.o. MRN: [id number]  CC: f/u    HPI  Ricky Garrett a 77 y.o.malewith a PMH of HTN, HLD, DM2, and thrombocytopenia presents on f/u for DM and HTN. A1c 8.9% three months ago. Had risen from 6.8% previous to that due to dietary indiscretion. Metformin was switched to 1000 mg XR. A1c 6.0% today. Takes medications as directed. Does not endorse polyuria, polydipsia, polyphagia, visual blurring, tingling, or numbness. Denies any other symptoms or complaints.       Outpatient Medications Prior to Visit  Medication Sig Dispense Refill  . aspirin EC 81 MG tablet Take 1 tablet (81 mg total) by mouth daily. 100 tablet 6  . fenofibrate (TRICOR) 145 MG tablet Take 1 tablet (145 mg total) by mouth daily. 30 tablet 1  . fluticasone (FLONASE) 50 MCG/ACT nasal spray Place 1 spray into both nostrils daily. 16 g 5  . lisinopril-hydrochlorothiazide (PRINZIDE,ZESTORETIC) 20-25 MG tablet Take 1 tablet by mouth daily. 90 tablet 3  . lovastatin (MEVACOR) 20 MG tablet Take 1 tablet (20 mg total) by mouth at bedtime. Suspend Gemfibrozil when taking this medication. 90 tablet 3  . metFORMIN (GLUCOPHAGE XR) 500 MG 24 hr tablet Take 2 tablets (1,000 mg total) by mouth daily with breakfast. 180 tablet 3  . Multiple Vitamins-Minerals (CENTRUM SILVER) tablet Take 1 tablet by mouth daily.    . NONFORMULARY OR COMPOUNDED ITEM [facility]  Onychomycosis Nail Lacquer -  Fluconazole 2%, Terbinafine 1% DMSO Apply to affected nail once daily Qty. 120 gm 3 refills    . omeprazole (PRILOSEC) 40 MG capsule Take 1 capsule (40 mg total) by mouth daily. 30 capsule 5  . ranitidine (ZANTAC) 150 MG tablet Take 1 tablet (150 mg total) by mouth 2 (two) times daily. 60 tablet 5  . terazosin (HYTRIN) 1 MG capsule Take 1 capsule (1 mg total) by mouth at bedtime. 90 capsule 3   No facility-administered medications prior to visit.      ROS Review of  Systems  Constitutional: Negative for chills, fever and malaise/fatigue.  Eyes: Negative for blurred vision.  Respiratory: Negative for shortness of breath.   Cardiovascular: Negative for chest pain and palpitations.  Gastrointestinal: Negative for abdominal pain and nausea.  Genitourinary: Negative for dysuria and hematuria.  Musculoskeletal: Negative for joint pain and myalgias.  Skin: Negative for rash.  Neurological: Negative for tingling and headaches.  Psychiatric/Behavioral: Negative for depression. The patient is not nervous/anxious.     Objective:  BP 110/69 (BP Location: Left Arm, Patient Position: Sitting, Cuff Size: Normal)   Pulse (!) 59   Temp (!) 97.4 F (36.3 C) (Oral)   Resp 18   Ht 5\' 5"  (1.651 m)   Wt 147 lb (66.7 kg)   SpO2 96%   BMI 24.46 kg/m   BP/Weight 06/04/2017 07/20/3084 06/06/8467  Systolic BP 629 528 413  Diastolic BP 69 82 72  Wt. (Lbs) 147 160.2 159.8  BMI 24.46 27.07 27.01      Physical Exam  Constitutional: He is oriented to person, place, and time.  Well developed, well nourished, NAD, polite  HENT:  Head: Normocephalic and atraumatic.  Eyes: No scleral icterus.  Neck: Normal range of motion. Neck supple. No thyromegaly present.  Cardiovascular: Normal rate, regular rhythm and normal heart sounds.  Pulmonary/Chest: Effort normal and breath sounds normal.  Abdominal: Soft. Bowel sounds are normal. There is no tenderness.  Musculoskeletal: He  exhibits no edema.  Neurological: He is alert and oriented to person, place, and time.  Skin: Skin is warm and dry. No rash noted. No erythema. No pallor.  Psychiatric: He has a normal mood and affect. His behavior is normal. Thought content normal.  Vitals reviewed.    Assessment & Plan:    1. Type 2 diabetes mellitus without complication, without long-term current use of insulin (HCC) - HgB A1c 6.0% today - Glucose (CBG), Fasting - metFORMIN (GLUCOPHAGE XR) 500 MG 24 hr tablet; Take 2 tablets  (1,000 mg total) by mouth daily with breakfast.  Dispense: 180 tablet; Refill: 3  2. Essential hypertension - lisinopril-hydrochlorothiazide (PRINZIDE,ZESTORETIC) 20-25 MG tablet; Take 1 tablet by mouth daily.  Dispense: 90 tablet; Refill: 3  3. Hypertriglyceridemia - lovastatin (MEVACOR) 20 MG tablet; Take 1 tablet (20 mg total) by mouth at bedtime. Suspend Gemfibrozil when taking this medication.  Dispense: 90 tablet; Refill: 3  4. Medication refill - terazosin (HYTRIN) 1 MG capsule; Take 1 capsule (1 mg total) by mouth at bedtime.  Dispense: 90 capsule; Refill: 3 - ranitidine (ZANTAC) 150 MG tablet; Take 1 tablet (150 mg total) by mouth 2 (two) times daily.  Dispense: 60 tablet; Refill: 5 - lisinopril-hydrochlorothiazide (PRINZIDE,ZESTORETIC) 20-25 MG tablet; Take 1 tablet by mouth daily.  Dispense: 90 tablet; Refill: 3 - aspirin EC 81 MG tablet; Take 1 tablet (81 mg total) by mouth daily.  Dispense: 100 tablet; Refill: 6 - Multiple Vitamins-Minerals (CENTRUM SILVER) tablet; Take 1 tablet by mouth daily.   Meds ordered this encounter  Medications  . terazosin (HYTRIN) 1 MG capsule    Sig: Take 1 capsule (1 mg total) by mouth at bedtime.    Dispense:  90 capsule    Refill:  3    Order Specific Question:   Supervising Provider    Answer:   Tresa Garter [id number]  . ranitidine (ZANTAC) 150 MG tablet    Sig: Take 1 tablet (150 mg total) by mouth 2 (two) times daily.    Dispense:  60 tablet    Refill:  5    Order Specific Question:   Supervising Provider    Answer:   Tresa Garter [id number]  . metFORMIN (GLUCOPHAGE XR) 500 MG 24 hr tablet    Sig: Take 2 tablets (1,000 mg total) by mouth daily with breakfast.    Dispense:  180 tablet    Refill:  3    Order Specific Question:   Supervising Provider    Answer:   Tresa Garter [id number]  . lovastatin (MEVACOR) 20 MG tablet    Sig: Take 1 tablet (20 mg total) by mouth at bedtime. Suspend Gemfibrozil when taking  this medication.    Dispense:  90 tablet    Refill:  3    Order Specific Question:   Supervising Provider    Answer:   Tresa Garter [id number]  . lisinopril-hydrochlorothiazide (PRINZIDE,ZESTORETIC) 20-25 MG tablet    Sig: Take 1 tablet by mouth daily.    Dispense:  90 tablet    Refill:  3    Order Specific Question:   Supervising Provider    Answer:   Tresa Garter [id number]  . aspirin EC 81 MG tablet    Sig: Take 1 tablet (81 mg total) by mouth daily.    Dispense:  100 tablet    Refill:  6    Order Specific Question:   Supervising Provider    Answer:   Tresa Garter [  [id number]]  . Multiple Vitamins-Minerals (CENTRUM SILVER) tablet    Sig: Take 1 tablet by mouth daily.    Order Specific Question:   Supervising Provider    Answer:   Tresa Garter [[id number]]    Follow-up: 6 months  Clent Demark PA

## 2017-06-05 ENCOUNTER — Other Ambulatory Visit (INDEPENDENT_AMBULATORY_CARE_PROVIDER_SITE_OTHER): Payer: Self-pay | Admitting: Physician Assistant

## 2017-06-05 ENCOUNTER — Ambulatory Visit (INDEPENDENT_AMBULATORY_CARE_PROVIDER_SITE_OTHER): Payer: Medicare Other | Admitting: Physician Assistant

## 2017-06-05 LAB — LIPID PANEL
CHOL/HDL RATIO: 4.2 ratio (ref 0.0–5.0)
Cholesterol, Total: 178 mg/dL (ref 100–199)
HDL: 42 mg/dL (ref >39)
LDL CALC: 96 mg/dL (ref 0–99)
TRIGLYCERIDES: 200 mg/dL — AB (ref 0–149)
VLDL Cholesterol Cal: 40 mg/dL (ref 5–40)

## 2017-06-06 ENCOUNTER — Telehealth (INDEPENDENT_AMBULATORY_CARE_PROVIDER_SITE_OTHER): Payer: Self-pay | Admitting: Physician Assistant

## 2017-06-06 ENCOUNTER — Ambulatory Visit (INDEPENDENT_AMBULATORY_CARE_PROVIDER_SITE_OTHER): Payer: Medicare Other | Admitting: Physician Assistant

## 2017-06-06 NOTE — Telephone Encounter (Signed)
Patient called requesting a rx refill for  fenofibrate (TRICOR) 145 MG tablet  He use  [facility] [address]  Thank you

## 2017-06-07 ENCOUNTER — Other Ambulatory Visit: Payer: Self-pay | Admitting: Nurse Practitioner

## 2017-06-07 DIAGNOSIS — E781 Pure hyperglyceridemia: Secondary | ICD-10-CM

## 2017-06-07 MED ORDER — FENOFIBRATE 145 MG PO TABS: 145.0000 mg | ORAL_TABLET | Freq: Every day | ORAL | 1 refills | Status: DC

## 2017-07-06 ENCOUNTER — Ambulatory Visit: Payer: Medicare Other | Admitting: Podiatry

## 2017-07-13 ENCOUNTER — Other Ambulatory Visit (INDEPENDENT_AMBULATORY_CARE_PROVIDER_SITE_OTHER): Payer: Self-pay | Admitting: Physician Assistant

## 2017-07-20 ENCOUNTER — Ambulatory Visit: Payer: Medicare Other | Admitting: Podiatry

## 2017-08-01 ENCOUNTER — Encounter: Payer: Self-pay | Admitting: Podiatry

## 2017-08-01 ENCOUNTER — Ambulatory Visit (INDEPENDENT_AMBULATORY_CARE_PROVIDER_SITE_OTHER): Payer: Medicare Other | Admitting: Podiatry

## 2017-08-01 DIAGNOSIS — E119 Type 2 diabetes mellitus without complications: Secondary | ICD-10-CM

## 2017-08-01 NOTE — Progress Notes (Signed)
This patient presents to the office for his annual diabetic foot exam. He states he's having no pain and discomfort or concerns in his feet.  He was seen last year by Dr. Jacqualyn Posey who evaluated and treated him. He presents the office today for his annual diabetic foot exam.  Patient presents to the office to accompany her father and translate.  General Appearance  Alert, conversant and in no acute stress.  Vascular  Dorsalis pedis and posterior tibial  pulses are palpable  bilaterally.  Capillary return is within normal limits  bilaterally. Temperature is within normal limits  Bilaterally. Hair present on digits.  Neurologic  Senn-Weinstein monofilament wire test within normal limits  bilaterally. Muscle power within normal limits bilaterally.  Nails Normotropic nails with no evidence of bacterial or fungal infection.  Orthopedic  No limitations of motion of motion feet .  No crepitus or effusions noted.  No bony pathology or digital deformities noted.  Skin  normotropic skin with no porokeratosis noted bilaterally.  No signs of infections or ulcers noted.    Diabetes with no foot complications.  Diabetic foot exam was performed.  Vascular and neurologic  And orthopedic exam were all WNL  B/L  RTC 1 year.   Gardiner Barefoot DPM

## 2017-08-17 ENCOUNTER — Other Ambulatory Visit (INDEPENDENT_AMBULATORY_CARE_PROVIDER_SITE_OTHER): Payer: Self-pay | Admitting: Physician Assistant

## 2017-08-17 DIAGNOSIS — E781 Pure hyperglyceridemia: Secondary | ICD-10-CM

## 2017-08-17 MED ORDER — FENOFIBRATE 145 MG PO TABS: 145.0000 mg | ORAL_TABLET | Freq: Every day | ORAL | 5 refills | Status: DC

## 2017-10-30 DIAGNOSIS — Z23 Encounter for immunization: Secondary | ICD-10-CM | POA: Diagnosis not present

## 2017-10-31 ENCOUNTER — Ambulatory Visit (INDEPENDENT_AMBULATORY_CARE_PROVIDER_SITE_OTHER): Payer: Medicare Other | Admitting: Physician Assistant

## 2017-10-31 ENCOUNTER — Encounter (INDEPENDENT_AMBULATORY_CARE_PROVIDER_SITE_OTHER): Payer: Self-pay | Admitting: Physician Assistant

## 2017-10-31 VITALS — BP 117/74 | HR 57 | Temp 97.9°F | Ht 65.0 in | Wt 157.2 lb

## 2017-10-31 DIAGNOSIS — E781 Pure hyperglyceridemia: Secondary | ICD-10-CM | POA: Diagnosis not present

## 2017-10-31 DIAGNOSIS — I10 Essential (primary) hypertension: Secondary | ICD-10-CM | POA: Diagnosis not present

## 2017-10-31 DIAGNOSIS — E119 Type 2 diabetes mellitus without complications: Secondary | ICD-10-CM | POA: Diagnosis not present

## 2017-10-31 MED ORDER — TERAZOSIN HCL 1 MG PO CAPS: 1.0000 mg | ORAL_CAPSULE | Freq: Every day | ORAL | 3 refills | Status: DC

## 2017-10-31 MED ORDER — LISINOPRIL-HYDROCHLOROTHIAZIDE 20-25 MG PO TABS: 1.0000 | ORAL_TABLET | Freq: Every day | ORAL | 3 refills | Status: DC

## 2017-10-31 MED ORDER — FREESTYLE LIBRE 14 DAY READER DEVI: 1.0000 | Freq: Every day | 0 refills | Status: DC

## 2017-10-31 MED ORDER — FENOFIBRATE 145 MG PO TABS: 145.0000 mg | ORAL_TABLET | Freq: Every day | ORAL | 3 refills | Status: DC

## 2017-10-31 MED ORDER — ASPIRIN EC 81 MG PO TBEC: 81.0000 mg | DELAYED_RELEASE_TABLET | Freq: Every day | ORAL | 6 refills | Status: DC

## 2017-10-31 MED ORDER — LOVASTATIN 20 MG PO TABS: 20.0000 mg | ORAL_TABLET | Freq: Every day | ORAL | 3 refills | Status: DC

## 2017-10-31 MED ORDER — FREESTYLE LIBRE 14 DAY SENSOR MISC: 1.0000 | 3 refills | Status: DC

## 2017-10-31 MED ORDER — METFORMIN HCL ER 500 MG PO TB24: 1000.0000 mg | ORAL_TABLET | Freq: Every day | ORAL | 3 refills | Status: DC

## 2017-10-31 NOTE — Progress Notes (Signed)
Subjective:  Patient ID: Ricky Garrett, male    DOB: January 04, 1941  Age: 77 y.o. MRN: [id number]  CC: f/u HTN, HLD, and DM  HPI  Ricky Barajasis a 77 y.o.malewith a PMH of HTN, HLD, DM2, and thrombocytopenia presents on f/ufor DM, HLD, and HTN. Last A1c 6.0% nearly five months ago. Says he occasional checks his blood sugar at home with readings usually in the 120 - 140 range. Does not endorse polydipsia, polyuria, fatigue, visual blurring, or tingling/numbness.     Last BP 110/69 mmHg five months ago. BP 117/74 mmHg today. Take medications as directed and requests refills to last him over four months due to an upcoming trip to [place]. Does not endorse CP, palpitations, SOB, HA, abdominal pain, presyncope, syncope, or LE swelling.    Last triglycerides 200 mg/dL and LDL 96 mg/dL. Trying to keep a low fat diet and low carb diet. Requests refills. Does not endorse any other symptoms or complaints.      Outpatient Medications Prior to Visit  Medication Sig Dispense Refill  . aspirin EC 81 MG tablet Take 1 tablet (81 mg total) by mouth daily. 100 tablet 6  . fenofibrate (TRICOR) 145 MG tablet Take 1 tablet (145 mg total) by mouth daily. 30 tablet 5  . lisinopril-hydrochlorothiazide (PRINZIDE,ZESTORETIC) 20-25 MG tablet Take 1 tablet by mouth daily. 90 tablet 3  . lovastatin (MEVACOR) 20 MG tablet Take 1 tablet (20 mg total) by mouth at bedtime. Suspend Gemfibrozil when taking this medication. 90 tablet 3  . metFORMIN (GLUCOPHAGE XR) 500 MG 24 hr tablet Take 2 tablets (1,000 mg total) by mouth daily with breakfast. 180 tablet 3  . Multiple Vitamins-Minerals (CENTRUM SILVER) tablet Take 1 tablet by mouth daily.    . ranitidine (ZANTAC) 150 MG tablet Take 1 tablet (150 mg total) by mouth 2 (two) times daily. 60 tablet 5  . terazosin (HYTRIN) 1 MG capsule Take 1 capsule (1 mg total) by mouth at bedtime. 90 capsule 3   No facility-administered medications prior to visit.      ROS Review of  Systems  Constitutional: Negative for chills, fever and malaise/fatigue.  Eyes: Negative for blurred vision.  Respiratory: Negative for shortness of breath.   Cardiovascular: Negative for chest pain and palpitations.  Gastrointestinal: Negative for abdominal pain and nausea.  Genitourinary: Negative for dysuria and hematuria.  Musculoskeletal: Negative for joint pain and myalgias.  Skin: Negative for rash.  Neurological: Negative for tingling and headaches.  Psychiatric/Behavioral: Negative for depression. The patient is not nervous/anxious.     Objective:  BP 117/74 (BP Location: Left Arm, Patient Position: Sitting, Cuff Size: Normal)   Pulse (!) 57   Temp 97.9 F (36.6 C) (Oral)   Ht 5\' 5"  (1.651 m)   Wt 157 lb 3.2 oz (71.3 kg)   SpO2 97%   BMI 26.16 kg/m   BP/Weight 10/31/2017 06/04/2017 7/35/3299  Systolic BP 242 683 419  Diastolic BP 74 69 82  Wt. (Lbs) 157.2 147 160.2  BMI 26.16 24.46 27.07      Physical Exam  Constitutional: He is oriented to person, place, and time.  Well developed, well nourished, NAD, polite  HENT:  Head: Normocephalic and atraumatic.  Eyes: No scleral icterus.  Neck: Normal range of motion. Neck supple. No thyromegaly present.  Cardiovascular: Normal rate, regular rhythm and normal heart sounds.  Pulmonary/Chest: Effort normal and breath sounds normal.  Abdominal: Soft. Bowel sounds are normal. There is no tenderness.  Musculoskeletal: He exhibits no  edema.  Neurological: He is alert and oriented to person, place, and time.  Skin: Skin is warm and dry. No rash noted. No erythema. No pallor.  Psychiatric: He has a normal mood and affect. His behavior is normal. Thought content normal.  Vitals reviewed.    Assessment & Plan:   1. Type 2 diabetes mellitus without complication, without long-term current use of insulin (HCC) - Continuous Blood Gluc Receiver (FREESTYLE LIBRE 14 DAY READER) DEVI; 1 each by Does not apply route daily.  Dispense:  1 Device; Refill: 0 - Continuous Blood Gluc Sensor (FREESTYLE LIBRE 14 DAY SENSOR) MISC; 1 each by Does not apply route every 14 (fourteen) days.  Dispense: 6 each; Refill: 3 - Comprehensive metabolic panel - Comprehensive metabolic panel - metFORMIN (GLUCOPHAGE XR) 500 MG 24 hr tablet; Take 2 tablets (1,000 mg total) by mouth daily with breakfast.  Dispense: 180 tablet; Refill: 3  2. Hypertriglyceridemia - Lipid panel - lovastatin (MEVACOR) 20 MG tablet; Take 1 tablet (20 mg total) by mouth at bedtime. Suspend Gemfibrozil when taking this medication.  Dispense: 90 tablet; Refill: 3 - fenofibrate (TRICOR) 145 MG tablet; Take 1 tablet (145 mg total) by mouth daily.  Dispense: 90 tablet; Refill: 3  3. Essential hypertension - Comprehensive metabolic panel - terazosin (HYTRIN) 1 MG capsule; Take 1 capsule (1 mg total) by mouth at bedtime.  Dispense: 90 capsule; Refill: 3 - lisinopril-hydrochlorothiazide (PRINZIDE,ZESTORETIC) 20-25 MG tablet; Take 1 tablet by mouth daily.  Dispense: 90 tablet; Refill: 3 - aspirin EC 81 MG tablet; Take 1 tablet (81 mg total) by mouth daily.  Dispense: 100 tablet; Refill: 6    Meds ordered this encounter  Medications  . Continuous Blood Gluc Receiver (FREESTYLE LIBRE 14 DAY READER) DEVI    Sig: 1 each by Does not apply route daily.    Dispense:  1 Device    Refill:  0    Order Specific Question:   Supervising Provider    Answer:   Charlott Rakes [[id number]]  . Continuous Blood Gluc Sensor (FREESTYLE LIBRE 14 DAY SENSOR) MISC    Sig: 1 each by Does not apply route every 14 (fourteen) days.    Dispense:  6 each    Refill:  3    Order Specific Question:   Supervising Provider    Answer:   Charlott Rakes [[id number]]  . terazosin (HYTRIN) 1 MG capsule    Sig: Take 1 capsule (1 mg total) by mouth at bedtime.    Dispense:  90 capsule    Refill:  3    Order Specific Question:   Supervising Provider    Answer:   Charlott Rakes [[id number]]  . metFORMIN (GLUCOPHAGE XR) 500  MG 24 hr tablet    Sig: Take 2 tablets (1,000 mg total) by mouth daily with breakfast.    Dispense:  180 tablet    Refill:  3    Order Specific Question:   Supervising Provider    Answer:   Charlott Rakes [[id number]]  . lovastatin (MEVACOR) 20 MG tablet    Sig: Take 1 tablet (20 mg total) by mouth at bedtime. Suspend Gemfibrozil when taking this medication.    Dispense:  90 tablet    Refill:  3    Order Specific Question:   Supervising Provider    Answer:   Charlott Rakes [[id number]]  . lisinopril-hydrochlorothiazide (PRINZIDE,ZESTORETIC) 20-25 MG tablet    Sig: Take 1 tablet by mouth daily.    Dispense:  90 tablet  Refill:  3    Order Specific Question:   Supervising Provider    Answer:   Charlott Rakes [[id number]]  . fenofibrate (TRICOR) 145 MG tablet    Sig: Take 1 tablet (145 mg total) by mouth daily.    Dispense:  90 tablet    Refill:  3    Order Specific Question:   Supervising Provider    Answer:   Charlott Rakes [[id number]]  . aspirin EC 81 MG tablet    Sig: Take 1 tablet (81 mg total) by mouth daily.    Dispense:  100 tablet    Refill:  6    Order Specific Question:   Supervising Provider    Answer:   Charlott Rakes [[id number]]    Follow-up: Return in about 6 months (around 05/02/2018) for DM, HTN, and HLD.   Clent Demark PA

## 2017-10-31 NOTE — Patient Instructions (Signed)
Diabetes mellitus y nutrición  Diabetes Mellitus and Nutrition  Si sufre de diabetes (diabetes mellitus), es muy importante tener hábitos alimenticios saludables debido a que sus niveles de azúcar en la sangre (glucosa) se ven afectados en gran medida por lo que come y bebe. [place]er alimentos saludables en las cantidades adecuadas, aproximadamente a la misma hora todos los días, lo ayudará a:  · Controlar la glucemia.  · Disminuir el riesgo de sufrir una enfermedad cardíaca.  · Mejorar la presión arterial.  · Alcanzar o mantener un peso saludable.    Todas las personas que sufren de diabetes son diferentes y cada una tiene necesidades diferentes en cuanto a un plan de alimentación. El médico puede recomendarle que trabaje con un especialista en dietas y nutrición (nutricionista) para elaborar el mejor plan para usted. Su plan de alimentación puede variar según factores como:  · Las calorías que necesita.  · Los medicamentos que toma.  · Su peso.  · Sus niveles de glucemia, presión arterial y colesterol.  · Su nivel de actividad.  · Otras afecciones que tenga, como enfermedades cardíacas o renales.    ¿Cómo me afectan los carbohidratos?  Los carbohidratos afectan el nivel de glucemia más que cualquier otro tipo de alimento. La ingesta de carbohidratos naturalmente aumenta la cantidad glucosa en la sangre. El recuento de carbohidratos es un método destinado a llevar un registro de la cantidad de carbohidratos que se ingieren. El recuento de carbohidratos es importante para mantener la glucemia a un nivel saludable, en especial si utiliza insulina o toma determinados medicamentos por vía oral para la diabetes.  Es importante saber la cantidad de carbohidratos que se pueden ingerir en cada comida sin correr ningún riesgo. Esto es diferente en cada persona. El nutricionista puede ayudarlo a calcular la cantidad de carbohidratos que debe ingerir en cada comida y colación.   Los alimentos que contienen carbohidratos incluyen:  · Pan, cereal, arroz, pasta y galletas.  · Papas y maíz.  · Guisantes, frijoles y lentejas.  · Leche y yogur.  · Frutas y jugo.  · Postres, como pasteles, galletitas, helado y caramelos.    ¿Cómo me afecta el alcohol?  El alcohol puede provocar disminuciones súbitas de la glucemia (hipoglucemia), en especial si utiliza insulina o toma determinados medicamentos por vía oral para la diabetes. La hipoglucemia es una afección potencialmente mortal. Los síntomas de la hipoglucemia (somnolencia, mareos y confusión) son similares a los síntomas de haber consumido demasiado alcohol.  Si el médico afirma que el alcohol es seguro para usted, siga estas pautas:  · Limite el consumo de alcohol a no más de 1 medida por día si es mujer y no está embarazada, y a 2 medidas si es hombre. Una medida equivale a 12 oz (355 ml) de cerveza, 5 oz (148 ml) de vino o 1½ oz (44 ml) de bebidas de alta graduación alcohólica.  · No beba con el estómago vacío.  · Manténgase hidratado con agua, gaseosas dietéticas o té helado sin azúcar.  · Tenga en cuenta que las gaseosas comunes, los jugos y otros refrescos pueden contener mucha azúcar y se deben contar como carbohidratos.    Consejos para seguir este plan  Leer las etiquetas de los alimentos  · Comience por controlar el tamaño de la porción en la etiqueta. La cantidad de calorías, carbohidratos, grasas y otros nutrientes mencionados en la etiqueta se basan en una porción del alimento. Muchos alimentos contienen más de una porción por envase.  · Verifique la cantidad total de gramos (g)   de carbohidratos totales en una porción. Puede calcular la cantidad de porciones de carbohidratos al dividir el total de carbohidratos por 15. Por ejemplo, si un alimento posee un total de 30 g de carbohidratos, equivale a 2 porciones de carbohidratos.  · Verifique la cantidad de gramos (g) de grasas saturadas y grasas trans  en una porción. Escoja alimentos que no contengan grasa o que tengan un bajo contenido.  · Controle la cantidad de miligramos (mg) de sodio en una porción. La mayoría de las personas deben limitar la ingesta de sodio total a menos de 2300 mg por día.  · Siempre consulte la información nutricional de los alimentos etiquetados como “con bajo contenido de grasa” o “sin grasa”. Estos alimentos pueden ser más altos en azúcar agregada o en carbohidratos refinados y deben evitarse.  · Hable con el nutricionista para identificar sus objetivos diarios en cuanto a los nutrientes mencionados en la etiqueta.  De compras  · Evite comprar alimentos procesados, enlatados o prehechos. Estos alimentos tienden a tener mayor cantidad de grasa, sodio y azúcar agregada.  · Compre en la zona exterior de la tienda de comestibles. Esta incluye frutas y vegetales frescos, granos a granel, carnes frescas y productos lácteos frescos.  Cocción  · Utilice métodos de cocción a baja temperatura, como hornear, en lugar de métodos de cocción a alta temperatura, como freír en abundante aceite.  · Cocine con aceites saludables, como el aceite de oliva, canola o girasol.  · Evite cocinar con manteca, crema o carnes con alto contenido de grasa.  Planificación de las comidas  · Consuma las comidas y las colaciones de forma regular, preferentemente a la misma hora todos los días. Evite pasar largos períodos de tiempo sin comer.  · Consuma alimentos ricos en fibra, como frutas frescas, verduras, frijoles y cereales integrales. Consulte al nutricionista sobre cuántas porciones de carbohidratos puede consumir en cada comida.  · Consuma entre 4 y 6 onzas de proteínas magras por día, como carnes magras, pollo, pescado, huevos o tofu. 1 onza equivale a 1 onza de carne, pollo o pescado, 1 huevo, o 1/4 taza de tofu.  · Coma algunos alimentos por día que contengan grasas saludables, como aguacates, frutos secos, semillas y pescado.  Estilo de vida     · Controle su nivel de glucemia con regularidad.  · Haga ejercicio al menos 30 minutos, 5 días o más por semana, o como se lo haya indicado el médico.  · Tome los medicamentos como se lo haya indicado el médico.  · No consuma ningún producto que contenga nicotina o tabaco, como cigarrillos y cigarrillos electrónicos. Si necesita ayuda para dejar de fumar, consulte al médico.  · Trabaje con un asesor o instructor en diabetes para identificar estrategias para controlar el estrés y cualquier desafío emocional y social.  ¿Cuáles son algunas de las preguntas que puedo hacerle a mi médico?  · ¿Es necesario que me reúna con un instructor en diabetes?  · ¿Es necesario que me reúna con un nutricionista?  · ¿A qué número puedo llamar si tengo preguntas?  · ¿Cuáles son los mejores momentos para controlar la glucemia?  Dónde encontrar más información:  · Asociación Americana de la Diabetes (American Diabetes Association): diabetes.org/food-and-fitness/food  · Academia de Nutrición y Dietética (Academy of Nutrition and Dietetics): www.eatright.org/resources/health/diseases-and-conditions/diabetes  · Instituto Nacional de la Diabetes y las Enfermedades Digestivas y Renales (National Institute of Diabetes and Digestive and Kidney Diseases) (Institutos Nacionales de Salud, NIH): www.niddk.nih.gov/health-information/diabetes/overview/diet-eating-physical-activity  Resumen  · Un plan de alimentación saludable   lo ayudará a controlar la glucemia y mantener un estilo de vida saludable.  · Trabajar con un especialista en dietas y nutrición (nutricionista) puede ayudarlo a elaborar el mejor plan de alimentación para usted.  · Tenga en cuenta que los carbohidratos y el alcohol tienen efectos inmediatos en sus niveles de glucemia. Es importante contar los carbohidratos y consumir alcohol con prudencia.  Esta información no tiene como fin reemplazar el consejo del médico. Asegúrese de hacerle al médico cualquier pregunta que tenga.   Document Released: 04/25/2007 Document Revised: 05/08/2016 Document Reviewed: 05/08/2016  Elsevier Interactive Patient Education © 2018 Elsevier Inc.

## 2017-11-01 LAB — COMPREHENSIVE METABOLIC PANEL
A/G RATIO: 2.3 — AB (ref 1.2–2.2)
ALBUMIN: 4.5 g/dL (ref 3.5–4.8)
ALT: 25 IU/L (ref 0–44)
AST: 25 IU/L (ref 0–40)
Alkaline Phosphatase: 49 IU/L (ref 39–117)
BUN / CREAT RATIO: 20 (ref 10–24)
BUN: 21 mg/dL (ref 8–27)
Bilirubin Total: 0.5 mg/dL (ref 0.0–1.2)
CALCIUM: 9.5 mg/dL (ref 8.6–10.2)
CO2: 27 mmol/L (ref 20–29)
CREATININE: 1.04 mg/dL (ref 0.76–1.27)
Chloride: 104 mmol/L (ref 96–106)
GFR, EST AFRICAN AMERICAN: 80 mL/min/{1.73_m2} (ref >59)
GFR, EST NON AFRICAN AMERICAN: 69 mL/min/{1.73_m2} (ref >59)
Globulin, Total: 2 g/dL (ref 1.5–4.5)
Glucose: 109 mg/dL — ABNORMAL HIGH (ref 65–99)
Potassium: 4.5 mmol/L (ref 3.5–5.2)
Sodium: 141 mmol/L (ref 134–144)
Total Protein: 6.5 g/dL (ref 6.0–8.5)

## 2017-11-01 LAB — LIPID PANEL
CHOL/HDL RATIO: 3.2 ratio (ref 0.0–5.0)
Cholesterol, Total: 146 mg/dL (ref 100–199)
HDL: 46 mg/dL (ref >39)
LDL CALC: 78 mg/dL (ref 0–99)
TRIGLYCERIDES: 111 mg/dL (ref 0–149)
VLDL Cholesterol Cal: 22 mg/dL (ref 5–40)

## 2017-11-02 ENCOUNTER — Ambulatory Visit (INDEPENDENT_AMBULATORY_CARE_PROVIDER_SITE_OTHER): Payer: Medicare Other | Admitting: Physician Assistant

## 2017-11-02 ENCOUNTER — Telehealth (INDEPENDENT_AMBULATORY_CARE_PROVIDER_SITE_OTHER): Payer: Self-pay

## 2017-11-02 NOTE — Telephone Encounter (Signed)
-----   Message from Clent Demark, PA-C sent at 11/01/2017  8:48 AM EDT ----- Cholesterol is much improved and at normal levels. Rest of labs unremarkable.

## 2017-11-02 NOTE — Telephone Encounter (Signed)
Patients daughter is aware that patients cholesterol is much more improved and is at normal levels but per PCP he still needs to continue taking his cholesterol medication. All other labs normal. She will inform patient of results. Nat Christen, CMA

## 2018-03-12 ENCOUNTER — Telehealth: Payer: Self-pay | Admitting: Physician Assistant

## 2018-03-12 NOTE — Telephone Encounter (Signed)
Do we accept this pt's insurance? If so, ok to schedule. Are they aware we're in [place]? Is this husband of other pt?

## 2018-03-12 NOTE — Telephone Encounter (Signed)
Pt daugh

## 2018-03-12 NOTE — Telephone Encounter (Signed)
Pt daughter called to ask about establishing care with Dr Darnell Level. He does not speak english and her current physician left the parctice. OK to est care?

## 2018-03-14 NOTE — Telephone Encounter (Signed)
Appt made, ppw mailed

## 2018-03-14 NOTE — Telephone Encounter (Signed)
I left msg for daughter, [name], to return my call for scheduling.

## 2018-04-05 DIAGNOSIS — D1801 Hemangioma of skin and subcutaneous tissue: Secondary | ICD-10-CM | POA: Diagnosis not present

## 2018-04-05 DIAGNOSIS — D2261 Melanocytic nevi of right upper limb, including shoulder: Secondary | ICD-10-CM | POA: Diagnosis not present

## 2018-04-05 DIAGNOSIS — Z85828 Personal history of other malignant neoplasm of skin: Secondary | ICD-10-CM | POA: Diagnosis not present

## 2018-04-05 DIAGNOSIS — L821 Other seborrheic keratosis: Secondary | ICD-10-CM | POA: Diagnosis not present

## 2018-04-05 DIAGNOSIS — L814 Other melanin hyperpigmentation: Secondary | ICD-10-CM | POA: Diagnosis not present

## 2018-04-05 DIAGNOSIS — D225 Melanocytic nevi of trunk: Secondary | ICD-10-CM | POA: Diagnosis not present

## 2018-04-05 DIAGNOSIS — L570 Actinic keratosis: Secondary | ICD-10-CM | POA: Diagnosis not present

## 2018-04-22 ENCOUNTER — Telehealth: Payer: Self-pay | Admitting: Physician Assistant

## 2018-04-22 NOTE — Telephone Encounter (Signed)
Left message asking pt to call office please r/s new patient appointment 3/23

## 2018-04-24 ENCOUNTER — Ambulatory Visit: Payer: Medicare Other | Admitting: Family Medicine

## 2018-06-04 ENCOUNTER — Ambulatory Visit: Payer: Medicare Other

## 2018-06-04 ENCOUNTER — Encounter: Payer: Self-pay | Admitting: Family Medicine

## 2018-06-04 ENCOUNTER — Ambulatory Visit (INDEPENDENT_AMBULATORY_CARE_PROVIDER_SITE_OTHER): Payer: Medicare Other | Admitting: Family Medicine

## 2018-06-04 VITALS — BP 132/82 | HR 66 | Temp 97.8°F | Ht 65.0 in | Wt 158.0 lb

## 2018-06-04 DIAGNOSIS — K219 Gastro-esophageal reflux disease without esophagitis: Secondary | ICD-10-CM

## 2018-06-04 DIAGNOSIS — D696 Thrombocytopenia, unspecified: Secondary | ICD-10-CM

## 2018-06-04 DIAGNOSIS — R0989 Other specified symptoms and signs involving the circulatory and respiratory systems: Secondary | ICD-10-CM

## 2018-06-04 DIAGNOSIS — R351 Nocturia: Secondary | ICD-10-CM

## 2018-06-04 DIAGNOSIS — I10 Essential (primary) hypertension: Secondary | ICD-10-CM | POA: Diagnosis not present

## 2018-06-04 DIAGNOSIS — E118 Type 2 diabetes mellitus with unspecified complications: Secondary | ICD-10-CM

## 2018-06-04 DIAGNOSIS — N401 Enlarged prostate with lower urinary tract symptoms: Secondary | ICD-10-CM | POA: Diagnosis not present

## 2018-06-04 DIAGNOSIS — E785 Hyperlipidemia, unspecified: Secondary | ICD-10-CM

## 2018-06-04 MED ORDER — FAMOTIDINE 20 MG PO TABS: 20.0000 mg | ORAL_TABLET | Freq: Every day | ORAL | 3 refills | Status: DC

## 2018-06-04 NOTE — Progress Notes (Signed)
Virtual visit completed through [employer]. Due to national recommendations of social distancing due to [place] 19, a virtual visit is felt to be most appropriate for this patient at this time.   Patient location: home, daughter [name] also on call with him Provider location: [place] [facility], office If any vitals were documented, they were collected by patient at home unless specified below.    BP 132/82 (BP Location: Left Arm, Patient Position: Sitting, Cuff Size: Normal)   Pulse 66   Temp 97.8 F (36.6 C) (Oral)   Ht 5\' 5"  (1.651 m)   Wt 158 lb (71.7 kg)   BMI 26.29 kg/m    CC: new patient to establish Subjective:    Patient ID: Ricky Garrett, male    DOB: March 29, 1940, 78 y.o.   MRN: [id number]  HPI: Ricky Garrett is a 78 y.o. male presenting on 06/04/2018 for New Patient (Initial Visit)   Prior saw Domenica Fail PA ([facility]) until he left practice.   Several month history of sore throat "like bone in throat" without dysphagia. Ongoing infrequent dry cough. Denies h/o allergic rhinitis. Does endorse occasional GERD symptoms.   HTN - Compliant with current antihypertensive regimen of lisinopril hctz 20/25mg  daily. On BP med for the past year. Does check blood pressures at home: well controlled. No low blood pressure readings or symptoms of dizziness/syncope. Denies HA, vision changes, CP/tightness, SOB, leg swelling.    DM - dx about 10 yrs ago. Does not regularly check sugars. Compliant with antihyperglycemic regimen which includes: metformin XR 500mg  2 daily. Denies low sugars or hypoglycemic symptoms. Denies paresthesias. Does have nocturnal leg cramps. Last diabetic eye exam 04/2017. Pneumovax: 2017. Prevnar: DUE. Glucometer brand: does not have one. DSME: has not completed.  Lab Results  Component Value Date   HGBA1C 6.0 06/04/2017   Diabetic Foot Exam - Simple   No data filed     No results found for: MICROALBUR, [id number]    BPH - takes terazosin 1 mg  nightly for nocturia with benefit. Nocturia 1-2 times a night.      Relevant past medical, surgical, family and social history reviewed and updated as indicated. Interim medical history since our last visit reviewed. Allergies and medications reviewed and updated. Outpatient Medications Prior to Visit  Medication Sig Dispense Refill  . aspirin EC 81 MG tablet Take 1 tablet (81 mg total) by mouth daily. 100 tablet 6  . fenofibrate (TRICOR) 145 MG tablet Take 1 tablet (145 mg total) by mouth daily. 90 tablet 3  . lisinopril-hydrochlorothiazide (PRINZIDE,ZESTORETIC) 20-25 MG tablet Take 1 tablet by mouth daily. 90 tablet 3  . lovastatin (MEVACOR) 20 MG tablet Take 1 tablet (20 mg total) by mouth at bedtime. Suspend Gemfibrozil when taking this medication. 90 tablet 3  . metFORMIN (GLUCOPHAGE XR) 500 MG 24 hr tablet Take 2 tablets (1,000 mg total) by mouth daily with breakfast. 180 tablet 3  . Multiple Vitamins-Minerals (CENTRUM SILVER) tablet Take 1 tablet by mouth daily.    Marland Kitchen terazosin (HYTRIN) 1 MG capsule Take 1 capsule (1 mg total) by mouth at bedtime. 90 capsule 3  . Continuous Blood Gluc Receiver (FREESTYLE LIBRE 14 DAY READER) DEVI 1 each by Does not apply route daily. 1 Device 0  . Continuous Blood Gluc Sensor (FREESTYLE LIBRE 14 DAY SENSOR) MISC 1 each by Does not apply route every 14 (fourteen) days. 6 each 3   No facility-administered medications prior to visit.      Per  HPI unless specifically indicated in ROS section below Review of Systems Objective:    BP 132/82 (BP Location: Left Arm, Patient Position: Sitting, Cuff Size: Normal)   Pulse 66   Temp 97.8 F (36.6 C) (Oral)   Ht 5\' 5"  (1.651 m)   Wt 158 lb (71.7 kg)   BMI 26.29 kg/m   Wt Readings from Last 3 Encounters:  06/04/18 158 lb (71.7 kg)  10/31/17 157 lb 3.2 oz (71.3 kg)  06/04/17 147 lb (66.7 kg)     Physical exam: Gen: alert, NAD, not ill appearing Pulm: speaks in complete sentences without increased work  of breathing Psych: normal mood, normal thought content      Results for orders placed or performed in visit on 10/31/17  Comprehensive metabolic panel  Result Value Ref Range   Glucose 109 (H) 65 - 99 mg/dL   BUN 21 8 - 27 mg/dL   Creatinine, Ser 1.04 0.76 - 1.27 mg/dL   GFR calc non Af Amer 69 >59 mL/min/1.73   GFR calc Af Amer 80 >59 mL/min/1.73   BUN/Creatinine Ratio 20 10 - 24   Sodium 141 134 - 144 mmol/L   Potassium 4.5 3.5 - 5.2 mmol/L   Chloride 104 96 - 106 mmol/L   CO2 27 20 - 29 mmol/L   Calcium 9.5 8.6 - 10.2 mg/dL   Total Protein 6.5 6.0 - 8.5 g/dL   Albumin 4.5 3.5 - 4.8 g/dL   Globulin, Total 2.0 1.5 - 4.5 g/dL   Albumin/Globulin Ratio 2.3 (H) 1.2 - 2.2   Bilirubin Total 0.5 0.0 - 1.2 mg/dL   Alkaline Phosphatase 49 39 - 117 IU/L   AST 25 0 - 40 IU/L   ALT 25 0 - 44 IU/L  Lipid panel  Result Value Ref Range   Cholesterol, Total 146 100 - 199 mg/dL   Triglycerides 111 0 - 149 mg/dL   HDL 46 >39 mg/dL   VLDL Cholesterol Cal 22 5 - 40 mg/dL   LDL Calculated 78 0 - 99 mg/dL   Chol/HDL Ratio 3.2 0.0 - 5.0 ratio   Assessment & Plan:   Problem List Items Addressed This Visit    Thrombocytopenia (HCC)    Probable ITP. Will continue to monitor      Globus sensation    Ongoing for several months. Discussed possible GERD relation vs ACEI related. Will trial pepcid daily and reassess at f/u visit.       GERD (gastroesophageal reflux disease)    H/o PUD remotely. H/o H pylori positive remotely as well. Discussed pepcid vs PPI. Will try pepcid daily and reassess at f/u visit.       Relevant Medications   famotidine (PEPCID) 20 MG tablet   Essential hypertension    Chronic, stable. Continue current regimen.       Dyslipidemia    Chronic, stable on fenofibrate and lovastatin. The 10-year ASCVD risk score Mikey Bussing DC Brooke Bonito., et al., 2013) is: 54.6%   Values used to calculate the score:     Age: 35 years     Sex: Male     Is Non-Hispanic African American: No      Diabetic: Yes     Tobacco smoker: No     Systolic Blood Pressure: 734 mmHg     Is BP treated: Yes     HDL Cholesterol: 46 mg/dL     Total Cholesterol: 146 mg/dL       Controlled diabetes mellitus type 2 with complications ([place]) -  Primary    Chronic, stable on metformin - continue.       BPH (benign prostatic hyperplasia)    Chronic, stable on daily terazosin.           Meds ordered this encounter  Medications  . famotidine (PEPCID) 20 MG tablet    Sig: Take 1 tablet (20 mg total) by mouth daily.    Dispense:  30 tablet    Refill:  3   No orders of the defined types were placed in this encounter.   Follow up plan: Return in about 3 months (around 09/04/2018) for follow up visit.  Ria Bush, MD

## 2018-06-05 ENCOUNTER — Encounter: Payer: Self-pay | Admitting: Family Medicine

## 2018-06-05 NOTE — Assessment & Plan Note (Signed)
Probable ITP. Will continue to monitor

## 2018-06-05 NOTE — Assessment & Plan Note (Addendum)
Chronic, stable. Continue current regimen. 

## 2018-06-05 NOTE — Assessment & Plan Note (Signed)
Chronic, stable on daily terazosin.

## 2018-06-05 NOTE — Assessment & Plan Note (Signed)
Chronic, stable on metformin - continue.  ?

## 2018-06-05 NOTE — Assessment & Plan Note (Signed)
H/o PUD remotely. H/o H pylori positive remotely as well. Discussed pepcid vs PPI. Will try pepcid daily and reassess at f/u visit.

## 2018-06-05 NOTE — Assessment & Plan Note (Signed)
Ongoing for several months. Discussed possible GERD relation vs ACEI related. Will trial pepcid daily and reassess at f/u visit.

## 2018-06-05 NOTE — Assessment & Plan Note (Signed)
Chronic, stable on fenofibrate and lovastatin. The 10-year ASCVD risk score Mikey Bussing DC Brooke Bonito., et al., 2013) is: 54.6%   Values used to calculate the score:     Age: 78 years     Sex: Male     Is Non-Hispanic African American: No     Diabetic: Yes     Tobacco smoker: No     Systolic Blood Pressure: 468 mmHg     Is BP treated: Yes     HDL Cholesterol: 46 mg/dL     Total Cholesterol: 146 mg/dL

## 2018-06-19 ENCOUNTER — Ambulatory Visit: Payer: Medicare Other | Admitting: Family Medicine

## 2018-08-30 ENCOUNTER — Encounter: Payer: Self-pay | Admitting: Family Medicine

## 2018-08-30 ENCOUNTER — Other Ambulatory Visit: Payer: Self-pay

## 2018-08-30 ENCOUNTER — Ambulatory Visit (INDEPENDENT_AMBULATORY_CARE_PROVIDER_SITE_OTHER): Payer: Medicare Other | Admitting: Family Medicine

## 2018-08-30 VITALS — BP 132/74 | HR 58 | Temp 98.2°F | Ht 65.0 in | Wt 161.6 lb

## 2018-08-30 DIAGNOSIS — R351 Nocturia: Secondary | ICD-10-CM | POA: Diagnosis not present

## 2018-08-30 DIAGNOSIS — E781 Pure hyperglyceridemia: Secondary | ICD-10-CM

## 2018-08-30 DIAGNOSIS — R0989 Other specified symptoms and signs involving the circulatory and respiratory systems: Secondary | ICD-10-CM

## 2018-08-30 DIAGNOSIS — I10 Essential (primary) hypertension: Secondary | ICD-10-CM | POA: Diagnosis not present

## 2018-08-30 DIAGNOSIS — K219 Gastro-esophageal reflux disease without esophagitis: Secondary | ICD-10-CM

## 2018-08-30 DIAGNOSIS — E119 Type 2 diabetes mellitus without complications: Secondary | ICD-10-CM | POA: Diagnosis not present

## 2018-08-30 DIAGNOSIS — E118 Type 2 diabetes mellitus with unspecified complications: Secondary | ICD-10-CM

## 2018-08-30 DIAGNOSIS — Z23 Encounter for immunization: Secondary | ICD-10-CM

## 2018-08-30 DIAGNOSIS — H43811 Vitreous degeneration, right eye: Secondary | ICD-10-CM | POA: Diagnosis not present

## 2018-08-30 DIAGNOSIS — H2513 Age-related nuclear cataract, bilateral: Secondary | ICD-10-CM | POA: Diagnosis not present

## 2018-08-30 DIAGNOSIS — N401 Enlarged prostate with lower urinary tract symptoms: Secondary | ICD-10-CM

## 2018-08-30 LAB — POCT GLYCOSYLATED HEMOGLOBIN (HGB A1C): Hemoglobin A1C: 7.2 % — AB (ref 4.0–5.6)

## 2018-08-30 LAB — HM DIABETES EYE EXAM

## 2018-08-30 MED ORDER — LISINOPRIL-HYDROCHLOROTHIAZIDE 20-25 MG PO TABS: 1.0000 | ORAL_TABLET | Freq: Every day | ORAL | 3 refills | Status: DC

## 2018-08-30 MED ORDER — FENOFIBRATE 145 MG PO TABS: 145.0000 mg | ORAL_TABLET | Freq: Every day | ORAL | 3 refills | Status: DC

## 2018-08-30 MED ORDER — LOVASTATIN 20 MG PO TABS: 20.0000 mg | ORAL_TABLET | Freq: Every day | ORAL | 3 refills | Status: DC

## 2018-08-30 MED ORDER — METFORMIN HCL ER 500 MG PO TB24: 1000.0000 mg | ORAL_TABLET | Freq: Every day | ORAL | 3 refills | Status: DC

## 2018-08-30 MED ORDER — ACCU-CHEK AVIVA PLUS VI STRP: ORAL_STRIP | 3 refills | Status: DC

## 2018-08-30 MED ORDER — ACCU-CHEK AVIVA PLUS W/DEVICE KIT: PACK | 0 refills | Status: DC

## 2018-08-30 MED ORDER — TERAZOSIN HCL 1 MG PO CAPS: 1.0000 mg | ORAL_CAPSULE | Freq: Every day | ORAL | 3 refills | Status: DC

## 2018-08-30 NOTE — Assessment & Plan Note (Signed)
Stable period on terazosin, feels effective - continue.

## 2018-08-30 NOTE — Progress Notes (Signed)
This visit was conducted in person.  BP 132/74 (BP Location: Left Arm, Patient Position: Sitting, Cuff Size: Normal)   Pulse (!) 58   Temp 98.2 F (36.8 C) (Temporal)   Ht _0  (1.651 m)   Wt 161 lb 9 oz (73.3 kg)   SpO2 95%   BMI 26.89 kg/m    CC: 3 mo DM f/u visit Subjective:    Patient ID: Ricky Garrett, male    DOB: Jun 03, 1940, 78 y.o.   MRN: [id number]  HPI: Ricky Garrett is a 78 y.o. male presenting on 08/30/2018 for Diabetes (Here for 3 mo f/u.  Pt accompanied by daughter, [name]. )   Established care 05/2018 through virtual visit. See prior note for details.   Globus sensation - last visit we tried pepcid - he feels this has helped. ?ACEI related - actually improved with pepcid.  DM - does not regularly check sugars. Compliant with antihyperglycemic regimen which includes: metformin XR 511m 2 daily. Denies low sugars or hypoglycemic symptoms. Denies paresthesias. Last diabetic eye exam 08/30/2018 - Dr GKaty Fitch normal exam. Pneumovax: 2017. Prevnar: today. Glucometer brand: does not have. DSME: has not completed. Has seen podiatrist in the past.  Lab Results  Component Value Date   HGBA1C 7.2 (A) 08/30/2018   Diabetic Foot Exam - Simple   Simple Foot Form Diabetic Foot exam was performed with the following findings: Yes 08/30/2018  3:13 PM  Visual Inspection No deformities, no ulcerations, no other skin breakdown bilaterally: Yes Sensation Testing Intact to touch and monofilament testing bilaterally: Yes Pulse Check Posterior Tibialis and Dorsalis pulse intact bilaterally: Yes Comments    No results found for: MDerl Barrow     Relevant past medical, surgical, family and social history reviewed and updated as indicated. Interim medical history since our last visit reviewed. Allergies and medications reviewed and updated. Outpatient Medications Prior to Visit  Medication Sig Dispense Refill  . aspirin EC 81 MG tablet Take 1 tablet (81 mg total) by mouth  daily. 100 tablet 6  . famotidine (PEPCID) 20 MG tablet Take 1 tablet (20 mg total) by mouth daily. 30 tablet 3  . Multiple Vitamins-Minerals (CENTRUM SILVER) tablet Take 1 tablet by mouth daily.    . fenofibrate (TRICOR) 145 MG tablet Take 1 tablet (145 mg total) by mouth daily. 90 tablet 3  . lisinopril-hydrochlorothiazide (PRINZIDE,ZESTORETIC) 20-25 MG tablet Take 1 tablet by mouth daily. 90 tablet 3  . lovastatin (MEVACOR) 20 MG tablet Take 1 tablet (20 mg total) by mouth at bedtime. Suspend Gemfibrozil when taking this medication. 90 tablet 3  . metFORMIN (GLUCOPHAGE XR) 500 MG 24 hr tablet Take 2 tablets (1,000 mg total) by mouth daily with breakfast. 180 tablet 3  . terazosin (HYTRIN) 1 MG capsule Take 1 capsule (1 mg total) by mouth at bedtime. 90 capsule 3   No facility-administered medications prior to visit.      Per HPI unless specifically indicated in ROS section below Review of Systems Objective:    BP 132/74 (BP Location: Left Arm, Patient Position: Sitting, Cuff Size: Normal)   Pulse (!) 58   Temp 98.2 F (36.8 C) (Temporal)   Ht _1  (1.651 m)   Wt 161 lb 9 oz (73.3 kg)   SpO2 95%   BMI 26.89 kg/m   Wt Readings from Last 3 Encounters:  08/30/18 161 lb 9 oz (73.3 kg)  06/04/18 158 lb (71.7 kg)  10/31/17 157 lb 3.2 oz (71.3 kg)  Physical Exam Vitals signs and nursing note reviewed.  Constitutional:      General: He is not in acute distress.    Appearance: Normal appearance. He is well-developed. He is not ill-appearing.  HENT:     Head: Normocephalic and atraumatic.     Right Ear: External ear normal.     Left Ear: External ear normal.     Nose: Nose normal.     Mouth/Throat:     Mouth: Mucous membranes are moist.     Pharynx: No oropharyngeal exudate.  Eyes:     General: No scleral icterus.    Conjunctiva/sclera: Conjunctivae normal.     Pupils: Pupils are equal, round, and reactive to light.  Neck:     Musculoskeletal: Normal range of motion and  neck supple.  Cardiovascular:     Rate and Rhythm: Normal rate and regular rhythm.     Heart sounds: Normal heart sounds. No murmur.  Pulmonary:     Effort: Pulmonary effort is normal. No respiratory distress.     Breath sounds: Normal breath sounds. No wheezing or rales.  Musculoskeletal:     Right lower leg: No edema.     Left lower leg: No edema.     Comments: See HPI for foot exam if done  Lymphadenopathy:     Cervical: No cervical adenopathy.  Skin:    General: Skin is warm and dry.     Findings: No rash.  Neurological:     Mental Status: He is alert.       Results for orders placed or performed in visit on 08/30/18  POCT glycosylated hemoglobin (Hb A1C)  Result Value Ref Range   Hemoglobin A1C 7.2 (A) 4.0 - 5.6 %   HbA1c POC (<> result, manual entry)     HbA1c, POC (prediabetic range)     HbA1c, POC (controlled diabetic range)     Assessment & Plan:   Problem List Items Addressed This Visit    Hypertriglyceridemia   Relevant Medications   lisinopril-hydrochlorothiazide (ZESTORETIC) 20-25 MG tablet   terazosin (HYTRIN) 1 MG capsule   fenofibrate (TRICOR) 145 MG tablet   lovastatin (MEVACOR) 20 MG tablet   Globus sensation    Improved on pepcid - continue. Will continue ACEI for now.       GERD (gastroesophageal reflux disease)    Improving symptoms on pepcid 85m - continue.       Essential hypertension   Relevant Medications   lisinopril-hydrochlorothiazide (ZESTORETIC) 20-25 MG tablet   terazosin (HYTRIN) 1 MG capsule   fenofibrate (TRICOR) 145 MG tablet   lovastatin (MEVACOR) 20 MG tablet   Controlled diabetes mellitus type 2 with complications ([place] [facility]    A bit above goal noted on A1c today (prior 6%) - [place]. Encouraged renewed efforts at following diabetic diet. [facility] agrees.       Relevant Medications   lisinopril-hydrochlorothiazide  (ZESTORETIC) 20-25 MG tablet   metFORMIN (GLUCOPHAGE XR) 500 MG 24 hr tablet   lovastatin (MEVACOR) 20 MG tablet   Other Relevant Orders   POCT glycosylated hemoglobin (Hb A1C) (Completed)   BPH (benign prostatic hyperplasia)    Stable period on terazosin, feels effective - continue.        Other Visit Diagnoses    Need for vaccination with 13-polyvalent pneumococcal conjugate vaccine       Relevant Orders   Pneumococcal conjugate vaccine 13-valent  IM (Completed)       Meds ordered this encounter  Medications  . lisinopril-hydrochlorothiazide (ZESTORETIC) 20-25 MG tablet    Sig: Take 1 tablet by mouth daily.    Dispense:  90 tablet    Refill:  3  . metFORMIN (GLUCOPHAGE XR) 500 MG 24 hr tablet    Sig: Take 2 tablets (1,000 mg total) by mouth daily with breakfast.    Dispense:  180 tablet    Refill:  3  . terazosin (HYTRIN) 1 MG capsule    Sig: Take 1 capsule (1 mg total) by mouth at bedtime.    Dispense:  90 capsule    Refill:  3  . fenofibrate (TRICOR) 145 MG tablet    Sig: Take 1 tablet (145 mg total) by mouth daily.    Dispense:  90 tablet    Refill:  3  . lovastatin (MEVACOR) 20 MG tablet    Sig: Take 1 tablet (20 mg total) by mouth at bedtime. Suspend Gemfibrozil when taking this medication.    Dispense:  90 tablet    Refill:  3  . glucose blood (ACCU-CHEK AVIVA PLUS) test strip    Sig: Use as instructed E11.8    Dispense:  100 each    Refill:  3    Formulate based on insurance coverage  . Blood Glucose Monitoring Suppl (ACCU-CHEK AVIVA PLUS) w/Device KIT    Sig: Use as directed to check sugars daily and as needed E11.8    Dispense:  1 kit    Refill:  0    Formulate based on insurance coverage   Orders Placed This Encounter  Procedures  . Pneumococcal conjugate vaccine 13-valent IM  . POCT glycosylated hemoglobin (Hb A1C)    Follow up plan: Return in about 3 months (around 11/30/2018) for medicare wellness visit, follow up visit.  Ria Bush, MD

## 2018-08-30 NOTE — Assessment & Plan Note (Signed)
Improving symptoms on pepcid 20mg  - continue.

## 2018-08-30 NOTE — Assessment & Plan Note (Signed)
A bit above goal noted on A1c today (prior 6%) - daughter attributes to trip earlier this year [place] with dietary indiscretions. Encouraged renewed efforts at following diabetic diet. Reassess at [facility] in 3-4 months. Pt agrees.

## 2018-08-30 NOTE — Patient Instructions (Addendum)
prevnar today.  Gusto verlo hoy. Azucar regreso elevada - siga dieta diabetica mas estrictamente - baja en azucar y carbohidratos.  Llamenos con preguntas. Regresar en 3 meses para medicare wellness visit   Diabetes mellitus y nutricin, en adultos Diabetes Mellitus and Nutrition, Adult Si sufre de diabetes (diabetes mellitus), es muy importante tener hbitos alimenticios saludables debido a que sus niveles de Designer, television/film set sangre (glucosa) se ven afectados en gran medida por lo que come y bebe. [place] alimentos saludables en las cantidades [place], aproximadamente a la [employer], [place] ayudar a:  Aeronautical engineer glucemia.  Disminuir el riesgo de sufrir una enfermedad cardaca.  Mejorar la presin arterial.  Science writer o mantener un peso saludable. Todas las personas que sufren de diabetes son diferentes y cada una tiene necesidades diferentes en cuanto a un plan de alimentacin. El mdico puede recomendarle que trabaje con un especialista en dietas y nutricin (nutricionista) para Financial trader plan para usted. Su plan de alimentacin puede variar segn factores como:  Las caloras que necesita.  Los medicamentos que toma.  Su peso.  Sus niveles de glucemia, presin arterial y colesterol.  Su nivel de [place].  Otras afecciones que tenga, como enfermedades cardacas o renales. Cmo me afectan los carbohidratos? Los carbohidratos, o hidratos de carbono, afectan su nivel de glucemia ms que cualquier otro tipo de alimento. La ingesta de carbohidratos naturalmente aumenta la cantidad de [employer]. El recuento de carbohidratos es un mtodo destinado a Catering manager un registro de la cantidad de carbohidratos que se consumen. El recuento de carbohidratos es importante para Theatre manager la glucemia a un nivel saludable, especialmente si utiliza insulina o toma determinados medicamentos por va oral para la diabetes. Es importante conocer la cantidad de carbohidratos que se  pueden ingerir en cada comida sin correr Engineer, manufacturing. Esto es Psychologist, forensic. Su nutricionista puede ayudarlo a calcular la cantidad de carbohidratos que debe ingerir en cada comida y en cada refrigerio. Entre los alimentos que contienen carbohidratos, se incluyen:  Pan, cereal, arroz, pastas y galletas.  Papas y maz.  Guisantes, frijoles y lentejas.  Leche y Estate agent.  Lambert Mody y [place].  Postres, como pasteles, galletas, helado y caramelos. Cmo me afecta el alcohol? El alcohol puede provocar disminuciones sbitas de la glucemia (hipoglucemia), especialmente si utiliza insulina o toma determinados medicamentos por va oral para la diabetes. La hipoglucemia es una afeccin potencialmente mortal. Los sntomas de la hipoglucemia (somnolencia, mareos y confusin) son similares a los sntomas de haber consumido demasiado alcohol. Si el mdico afirma que el alcohol es seguro para usted, [place] estas pautas:  Limite el consumo de alcohol a no ms de 31medida por da si es mujer y no est [place], y a 67medidas si es hombre. Una medida equivale a 12oz (332ml) de cerveza, 5oz (149ml) de vino o 1oz (80ml) de bebidas alcohlicas de alta graduacin.  No beba con el estmago vaco.  Mantngase hidratado bebiendo agua, refrescos dietticos o t helado sin azcar.  Tenga en cuenta que los refrescos comunes, los jugos y otras bebida para Optician, dispensing pueden contener mucha azcar y se deben contar como carbohidratos. Cules son algunos consejos para seguir este plan?  Leer las etiquetas de los alimentos  Comience por leer el tamao de la porcin en la "Informacin nutricional" en las etiquetas de los alimentos envasados y las bebidas. La cantidad de caloras, carbohidratos, grasas y otros nutrientes mencionados en la etiqueta se basan en una porcin del  alimento. Muchos alimentos contienen ms de una porcin por envase.  Verifique la cantidad total de gramos (g) de carbohidratos totales  en una porcin. Puede calcular la cantidad de porciones de carbohidratos al dividir el total de carbohidratos por 15. Por ejemplo, si un alimento tiene un total de 30g de carbohidratos, equivale a 2 porciones de carbohidratos.  Verifique la cantidad de gramos (g) de grasas saturadas y grasas trans en una porcin. Escoja alimentos que no contengan grasa o que tengan un bajo contenido.  Verifique la cantidad de miligramos (mg) de sal (sodio) en una porcin. La [employer] de las personas deben limitar la ingesta de sodio total a menos de 2300mg  por Training and development officer.  Siempre consulte la informacin nutricional de los alimentos etiquetados como "con bajo contenido de grasa" o "sin grasa". Estos alimentos pueden tener un mayor contenido de Location manager agregada o carbohidratos refinados, y deben evitarse.  Hable con su nutricionista para identificar sus objetivos diarios en cuanto a los nutrientes mencionados en la etiqueta. Al ir de compras  Evite comprar alimentos procesados, enlatados o precocinados. Estos alimentos tienden a Special educational needs teacher mayor cantidad de [place], sodio y azcar agregada.  Compre en la zona exterior de la tienda de comestibles. Esta zona incluye frutas y verduras frescas, granos a granel, carnes frescas y productos lcteos frescos. Al cocinar  Utilice mtodos de coccin a baja temperatura, como hornear, en lugar de mtodos de coccin a alta temperatura, como frer en abundante aceite.  Cocine con aceites saludables, como el aceite de [place], canola o [place].  Evite cocinar con manteca, crema o carnes con alto contenido de grasa. Planificacin de las comidas  Coma las comidas y los refrigerios regularmente, preferentemente a la misma hora todos [place]. Evite pasar largos perodos de tiempo sin comer.  Consuma alimentos ricos en fibra, como frutas frescas, verduras, frijoles y cereales integrales. Consulte a su nutricionista sobre cuntas porciones de carbohidratos puede consumir en cada  comida.  Consuma entre 4 y 6 onzas (oz) de protenas magras por da, como carnes Watson, pollo, pescado, huevos o tofu. Una onza de protena magra equivale a: ? 1 onza de carne, pollo o pescado. ? 1huevo. ?  taza de tofu.  Coma algunos alimentos por da que contengan grasas saludables, como aguacates, frutos secos, semillas y pescado. Estilo de vida  Controle su nivel de glucemia con regularidad.  Haga actividad fsica habitualmente como se lo haya indicado el mdico. Esto puede incluir lo siguiente: ? 121minutos semanales de ejercicio de intensidad moderada o alta. Esto podra incluir caminatas dinmicas, ciclismo o gimnasia acutica. ? Realizar ejercicios de elongacin y de fortalecimiento, como yoga o levantamiento de pesas, por lo menos 2veces por semana.  Tome los [employer] se lo haya indicado el mdico.  No consuma ningn producto que contenga nicotina o tabaco, como cigarrillos y Psychologist, sport and exercise. Si necesita ayuda para dejar de fumar, consulte al [employer] con un asesor o instructor en diabetes para identificar estrategias para controlar el estrs y cualquier desafo emocional y social. Preguntas para hacerle al mdico  Es necesario que consulte a Radio broadcast assistant en el cuidado de la diabetes?  Es necesario que me rena con un nutricionista?  A qu nmero puedo llamar si tengo preguntas?  Cules son los mejores momentos para controlar la glucemia? Dnde encontrar ms informacin:  Asociacin Estadounidense de la Diabetes (American Diabetes Association): diabetes.org  Academia de Nutricin y Information systems manager (Academy of Nutrition and Dietetics): www.eatright.[place] Diabetes y las  Enfermedades Digestivas y [facility] of Diabetes and Digestive and Kidney Diseases, NIH): DesMoinesFuneral.dk Resumen  Un plan de alimentacin saludable lo ayudar a controlar la glucemia y Theatre manager un estilo de vida  saludable.  Trabajar con un especialista en dietas y nutricin (nutricionista) puede ayudarlo a Insurance claims handler de alimentacin para usted.  Tenga en cuenta que los carbohidratos (hidratos de carbono) y el alcohol tienen efectos inmediatos en sus niveles de glucemia. Es importante contar los carbohidratos que ingiere y consumir alcohol con prudencia. Esta informacin no tiene Marine scientist el consejo del mdico. Asegrese de hacerle al mdico cualquier pregunta que tenga. Document Released: 04/25/2007 Document Revised: 09/26/2016 Document Reviewed: 05/08/2016 Elsevier Patient Education  2020 [employer].

## 2018-08-30 NOTE — Assessment & Plan Note (Signed)
Improved on pepcid - continue. Will continue ACEI for now.

## 2018-09-03 ENCOUNTER — Encounter: Payer: Self-pay | Admitting: Family Medicine

## 2018-09-03 ENCOUNTER — Ambulatory Visit: Payer: Medicare Other | Admitting: Family Medicine

## 2018-09-04 ENCOUNTER — Other Ambulatory Visit: Payer: Self-pay

## 2018-09-04 MED ORDER — ACCU-CHEK SOFTCLIX LANCETS MISC: 1.0000 | 0 refills | Status: DC

## 2018-09-04 NOTE — Telephone Encounter (Signed)
Received rx request from [facility] for Accu-Chek Softclix lancets.   E-scribed

## 2018-10-15 ENCOUNTER — Other Ambulatory Visit: Payer: Self-pay | Admitting: Family Medicine

## 2018-10-25 DIAGNOSIS — Z23 Encounter for immunization: Secondary | ICD-10-CM | POA: Diagnosis not present

## 2018-11-29 ENCOUNTER — Encounter: Payer: Medicare Other | Admitting: Family Medicine

## 2018-12-20 ENCOUNTER — Other Ambulatory Visit: Payer: Self-pay

## 2018-12-23 ENCOUNTER — Other Ambulatory Visit: Payer: Self-pay

## 2018-12-23 ENCOUNTER — Ambulatory Visit (INDEPENDENT_AMBULATORY_CARE_PROVIDER_SITE_OTHER): Payer: Medicare Other | Admitting: Family Medicine

## 2018-12-23 ENCOUNTER — Encounter: Payer: Self-pay | Admitting: Family Medicine

## 2018-12-23 VITALS — BP 120/66 | HR 60 | Temp 98.4°F | Ht 64.5 in | Wt 154.1 lb

## 2018-12-23 DIAGNOSIS — E785 Hyperlipidemia, unspecified: Secondary | ICD-10-CM | POA: Diagnosis not present

## 2018-12-23 DIAGNOSIS — E1169 Type 2 diabetes mellitus with other specified complication: Secondary | ICD-10-CM | POA: Diagnosis not present

## 2018-12-23 DIAGNOSIS — D696 Thrombocytopenia, unspecified: Secondary | ICD-10-CM | POA: Diagnosis not present

## 2018-12-23 DIAGNOSIS — K219 Gastro-esophageal reflux disease without esophagitis: Secondary | ICD-10-CM

## 2018-12-23 DIAGNOSIS — Z7189 Other specified counseling: Secondary | ICD-10-CM | POA: Diagnosis not present

## 2018-12-23 DIAGNOSIS — N401 Enlarged prostate with lower urinary tract symptoms: Secondary | ICD-10-CM | POA: Diagnosis not present

## 2018-12-23 DIAGNOSIS — E118 Type 2 diabetes mellitus with unspecified complications: Secondary | ICD-10-CM

## 2018-12-23 DIAGNOSIS — E781 Pure hyperglyceridemia: Secondary | ICD-10-CM | POA: Diagnosis not present

## 2018-12-23 DIAGNOSIS — I10 Essential (primary) hypertension: Secondary | ICD-10-CM

## 2018-12-23 DIAGNOSIS — Z0001 Encounter for general adult medical examination with abnormal findings: Secondary | ICD-10-CM | POA: Insufficient documentation

## 2018-12-23 DIAGNOSIS — Z0000 Encounter for general adult medical examination without abnormal findings: Secondary | ICD-10-CM | POA: Diagnosis not present

## 2018-12-23 DIAGNOSIS — R351 Nocturia: Secondary | ICD-10-CM

## 2018-12-23 DIAGNOSIS — E1159 Type 2 diabetes mellitus with other circulatory complications: Secondary | ICD-10-CM | POA: Diagnosis not present

## 2018-12-23 LAB — POCT GLYCOSYLATED HEMOGLOBIN (HGB A1C): Hemoglobin A1C: 7 % — AB (ref 4.0–5.6)

## 2018-12-23 MED ORDER — FAMOTIDINE 20 MG PO TABS: 20.0000 mg | ORAL_TABLET | Freq: Every day | ORAL | 3 refills | Status: DC

## 2018-12-23 NOTE — Assessment & Plan Note (Signed)
Advanced directive discussion - does not have set up. Unsure who would be HCPOA. Packet provided today.

## 2018-12-23 NOTE — Assessment & Plan Note (Signed)
Chronic, stable. Continue current regimen (lisinopril Jonita Albee).

## 2018-12-23 NOTE — Progress Notes (Signed)
This visit was conducted in person.  BP 120/66 (BP Location: Left Arm, Patient Position: Sitting, Cuff Size: Normal)   Pulse 60   Temp 98.4 F (36.9 C) (Temporal)   Ht 5' 4.5" (1.638 m)   Wt 154 lb 1 oz (69.9 kg)   SpO2 97%   BMI 26.04 kg/m    CC: CPE Subjective:    Patient ID: Ricky Garrett, male    DOB: May 18, 1940, 78 y.o.   MRN: [id number]  HPI: Ricky Garrett is a 78 y.o. male presenting on 12/23/2018 for Medicare Wellness (BS this morning was 133.  Pt accompanied by daughter, Lily (temp, 98.0).)   Did not see health advisor this year.   Hearing Screening   _0  _1  _2  _3  _4  _5  _6  _7  _8   Right ear:   0 40 0  0    Left ear:   40 40 40  0    Vision Screening Comments: Last eye exam, 10/2018.    Office Visit from 12/23/2018 in [facility]  PHQ-2 Total Score  0      Fall Risk  12/23/2018 10/31/2017 02/23/2017 07/02/2015 03/04/2015  Falls in the past year? 0 No No No No    Lab Results  Component Value Date   HGBA1C 7.0 (A) 12/23/2018     Preventative: COLONOSCOPY 01/2014 - diverticulosis, 2 polyps, hemorrhoids, rpt 5 yrs (Outlaw)  Prostate cancer screening - known BPH on terazosin. Will age out of prostate cancer screening. Nocturia x2.  Lung cancer screening - not eligible  Flu shot - yearly  Tdap 2017 Pneumovax 2017, prevnar 2020 Zostavax - 01/2017 - may have received expired Shingrix - discussed - to check with pharmacy  Advanced directive discussion - does not have set up. Unsure who would be HCPOA. Packet provided today.  Seat belt use discussed  Sunscreen use discussed. Would like moles on face checked  Ex smoker - quit 1990  Alcohol  - rare Dentist - regularly Eye exam - yearly  Lives with daughter [name]) and her husband, 2 grand children, and wife  Moved from [place] to the [place] (2008).  Was living in [place] and moved to [place] first and then to [facility] (2008).  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 Cares for grandchildren.  Edu - elementary  Living in [place] with wife and daughter's family.  Activity: walking regularly Diet: good water, fruits/vegetables daily     Relevant past medical, surgical, family and social history reviewed and updated as indicated. Interim medical history since our last visit reviewed. Allergies and medications reviewed and updated. Outpatient Medications Prior to Visit  Medication Sig Dispense Refill  . Accu-Chek Softclix Lancets lancets 1 each by Other route as directed. Use as instructed to check blood sugar.  Dx code:  E11.8 100 each 0  . aspirin EC 81 MG tablet Take 1 tablet (81 mg total) by mouth daily. 100 tablet 6  . Blood Glucose Monitoring Suppl (ACCU-CHEK AVIVA PLUS) w/Device KIT Use as directed to check sugars daily and as needed E11.8 1 kit 0  . fenofibrate (TRICOR) 145 MG tablet Take 1 tablet (145 mg total) by mouth daily. 90 tablet 3  . glucose blood (ACCU-CHEK AVIVA PLUS) test strip Use as instructed E11.8 100 each 3  . lisinopril-hydrochlorothiazide (ZESTORETIC) 20-25 MG tablet Take 1 tablet by mouth daily. 90 tablet 3  . lovastatin (MEVACOR) 20 MG tablet Take 1 tablet (20 mg total) by mouth at bedtime. Suspend Gemfibrozil when  taking this medication. 90 tablet 3  . metFORMIN (GLUCOPHAGE XR) 500 MG 24 hr tablet Take 2 tablets (1,000 mg total) by mouth daily with breakfast. 180 tablet 3  . Multiple Vitamins-Minerals (CENTRUM SILVER) tablet Take 1 tablet by mouth daily.    Marland Kitchen terazosin (HYTRIN) 1 MG capsule Take 1 capsule (1 mg total) by mouth at bedtime. 90 capsule 3  . famotidine (PEPCID) 20 MG tablet TAKE 1 TABLET BY MOUTH EVERY DAY 90 tablet 1   No facility-administered medications prior to visit.      Per HPI unless specifically indicated in ROS section below Review of Systems Objective:    BP 120/66 (BP Location: Left Arm, Patient Position: Sitting, Cuff Size: Normal)   Pulse 60   Temp 98.4 F (36.9 C) (Temporal)   Ht  5' 4.5" (1.638 m)   Wt 154 lb 1 oz (69.9 kg)   SpO2 97%   BMI 26.04 kg/m   Wt Readings from Last 3 Encounters:  12/23/18 154 lb 1 oz (69.9 kg)  08/30/18 161 lb 9 oz (73.3 kg)  06/04/18 158 lb (71.7 kg)    Physical Exam Vitals signs and nursing note reviewed.  Constitutional:      General: He is not in acute distress.    Appearance: Normal appearance. He is well-developed. He is not ill-appearing.  HENT:     Head: Normocephalic and atraumatic.     Right Ear: Hearing, tympanic membrane, ear canal and external ear normal.     Left Ear: Hearing, tympanic membrane, ear canal and external ear normal.     Nose: Nose normal.     Mouth/Throat:     Mouth: Mucous membranes are moist.     Pharynx: Oropharynx is clear. Uvula midline. No posterior oropharyngeal erythema.  Eyes:     General: No scleral icterus.    Extraocular Movements: Extraocular movements intact.     Conjunctiva/sclera: Conjunctivae normal.     Pupils: Pupils are equal, round, and reactive to light.  Neck:     Musculoskeletal: Normal range of motion and neck supple.  Cardiovascular:     Rate and Rhythm: Normal rate and regular rhythm.     Pulses: Normal pulses.          Radial pulses are 2+ on the right side and 2+ on the left side.     Heart sounds: Normal heart sounds. No murmur.  Pulmonary:     Effort: Pulmonary effort is normal. No respiratory distress.     Breath sounds: Normal breath sounds. No wheezing, rhonchi or rales.  Abdominal:     General: Abdomen is flat. Bowel sounds are normal. There is no distension.     Palpations: Abdomen is soft. There is no mass.     Tenderness: There is no abdominal tenderness. There is no guarding or rebound.     Hernia: No hernia is present.  Musculoskeletal: Normal range of motion.     Right lower leg: No edema.     Left lower leg: No edema.  Lymphadenopathy:     Cervical: No cervical adenopathy.  Skin:    General: Skin is warm and dry.     Findings: No rash.   Neurological:     General: No focal deficit present.     Mental Status: He is alert and oriented to person, place, and time.     Comments:  CN grossly intact, station and gait intact Recall 2/3, 3/3 with cue Calculation 4/5 serial 3s (elem education)  Psychiatric:  Mood and Affect: Mood normal.        Behavior: Behavior normal.        Thought Content: Thought content normal.        Judgment: Judgment normal.    Diabetic Foot Exam - Simple   Simple Foot Form Diabetic Foot exam was performed with the following findings: Yes 12/23/2018  5:05 PM  Visual Inspection No deformities, no ulcerations, no other skin breakdown bilaterally: Yes Sensation Testing Intact to touch and monofilament testing bilaterally: Yes Pulse Check Posterior Tibialis and Dorsalis pulse intact bilaterally: Yes Comments        Results for orders placed or performed in visit on 12/23/18  POCT glycosylated hemoglobin (Hb A1C)  Result Value Ref Range   Hemoglobin A1C 7.0 (A) 4.0 - 5.6 %   HbA1c POC (<> result, manual entry)     HbA1c, POC (prediabetic range)     HbA1c, POC (controlled diabetic range)     Lab Results  Component Value Date   WBC 4.4 02/23/2017   HGB 15.1 02/23/2017   HCT 44.7 02/23/2017   MCV 88 02/23/2017   PLT 102 (L) 02/23/2017    Assessment & Plan:  This visit occurred during the SARS-CoV-2 public health emergency.  Safety protocols were in place, including screening questions prior to the visit, additional usage of staff PPE, and extensive cleaning of exam room while observing appropriate contact time as indicated for disinfecting solutions.   Problem List Items Addressed This Visit    Thrombocytopenia ([place])    Forgot to check CBC - will check with next labs.      Medicare annual wellness visit, subsequent - Primary    I have personally reviewed the Medicare Annual Wellness questionnaire and have noted 1. The patient's medical and social history 2. Their use of alcohol,  tobacco or illicit drugs 3. Their current medications and supplements 4. The patient's functional ability including ADL's, fall risks, home safety risks and hearing or visual impairment. Cognitive function has been assessed and addressed as indicated.  5. Diet and physical activity 6. Evidence for depression or mood disorders The patients weight, height, BMI have been recorded in the chart. I have made referrals, counseling and provided education to the patient based on review of the above and I have provided the pt with a written personalized care plan for preventive services. Provider list updated.. See scanned questionairre as needed for further documentation. Reviewed preventative protocols and updated unless pt declined.       Hypertriglyceridemia   Relevant Orders   Lipid panel   Comprehensive metabolic panel   Hypertension associated with diabetes (HCC)    Chronic, stable. Continue current regimen (lisinopril Jonita Albee).       RESOLVED: Health maintenance examination   GERD (gastroesophageal reflux disease)    Stable on pepcid 89m daily - requests refill.       Relevant Medications   famotidine (PEPCID) 20 MG tablet   Dyslipidemia associated with type 2 diabetes mellitus (HCC)    Chronic, update FLP on fenofibrate and lovastatin. Continue. The 10-year ASCVD risk score (Mikey BussingDC JBrooke Bonito, et al., 2013) is: 48.6%   Values used to calculate the score:     Age: [age]years     Sex: Male     Is Non-Hispanic African American: No     Diabetic: Yes     Tobacco smoker: No     Systolic Blood Pressure: 1828mmHg     Is BP treated: Yes  HDL Cholesterol: 46 mg/dL     Total Cholesterol: 146 mg/dL       Controlled diabetes mellitus type 2 with complications (HCC)    Chronic, stable. Continue current regimen (metformin XL 1000 mg daily).       Relevant Orders   POCT glycosylated hemoglobin (Hb A1C) (Completed)   BPH (benign prostatic hyperplasia)    Stable period on terazosin, continue.        Advanced care planning/counseling discussion    Advanced directive discussion - does not have set up. Unsure who would be HCPOA. Packet provided today.           Meds ordered this encounter  Medications  . famotidine (PEPCID) 20 MG tablet    Sig: Take 1 tablet (20 mg total) by mouth daily.    Dispense:  90 tablet    Refill:  3   Orders Placed This Encounter  Procedures  . Lipid panel  . Comprehensive metabolic panel  . POCT glycosylated hemoglobin (Hb A1C)   Patient instructions: Considerar vacuna contra herpes zoster (shingrix) si le interesa, revise en su farmacia.  Laboratorios hoy. Revise paquete de directivos avanzados.  Gusto verlos hoy.  Regresar en 6 meses para proxima visita de diabetes.  Meta para azucar diaria: 1. En ayunas 80-120 2. 2 horas despues de comida 100-180  Follow up plan: Return in about 6 months (around 06/22/2019) for follow up visit.  Ria Bush, MD

## 2018-12-23 NOTE — Assessment & Plan Note (Signed)
Stable on pepcid 20mg  daily - requests refill.

## 2018-12-23 NOTE — Assessment & Plan Note (Signed)

## 2018-12-23 NOTE — Assessment & Plan Note (Signed)
Forgot to check CBC - will check with next labs.

## 2018-12-23 NOTE — Assessment & Plan Note (Signed)
Chronic, stable. Continue current regimen (metformin XL 1000 mg daily).

## 2018-12-23 NOTE — Assessment & Plan Note (Addendum)
Chronic, update FLP on fenofibrate and lovastatin. Continue. The 10-year ASCVD risk score Mikey Bussing DC Brooke Bonito., et al., 2013) is: 48.6%   Values used to calculate the score:     Age: 78 years     Sex: Male     Is Non-Hispanic African American: No     Diabetic: Yes     Tobacco smoker: No     Systolic Blood Pressure: 123456 mmHg     Is BP treated: Yes     HDL Cholesterol: 46 mg/dL     Total Cholesterol: 146 mg/dL

## 2018-12-23 NOTE — Assessment & Plan Note (Deleted)
Preventative protocols reviewed and updated unless pt declined. Discussed healthy diet and lifestyle.  

## 2018-12-23 NOTE — Patient Instructions (Addendum)
Considerar vacuna contra herpes zoster (shingrix) si le interesa, revise en su farmacia.  Laboratorios hoy. Revise paquete de directivos avanzados.  Gusto verlos hoy.  Regresar en 6 meses para proxima visita de diabetes.  Meta para azucar diaria: 1. En ayunas 80-120 2. 2 horas despues de comida 100-180  Mantenimiento de la salud despus de los 43 aos de edad Health Maintenance After Age 78 Despus de los 65 aos de edad, corre un riesgo mayor de Tourist information centre manager enfermedades e infecciones a Barrister's clerk, como tambin de sufrir lesiones por cadas. Las cadas son la causa principal de las fracturas de huesos y lesiones en la cabeza de personas mayores de 68 aos de edad. Recibir cuidados preventivos de forma regular puede ayudarlo a mantenerse saludable y en buen [place]. Los cuidados preventivos incluyen realizarse anlisis de forma regular y Actor en el estilo de vida segn las recomendaciones del mdico. Converse con el profesional que lo asiste sobre:  Las pruebas de deteccin y los anlisis que debe Dispensing optician. Una prueba de deteccin es un estudio que se para Hydrographic surveyor la presencia de una enfermedad cuando no tiene sntomas.  Un plan de dieta y ejercicios adecuado para usted. Qu debo saber sobre las pruebas de deteccin y los anlisis para prevenir cadas? Realizarse pruebas de deteccin y [employer] es la mejor manera de Hydrographic surveyor un problema de salud de forma temprana. El diagnstico y tratamiento tempranos le brindan la mejor oportunidad de Chief Technology Officer las afecciones mdicas que son comunes despus de los 76 aos de edad. Ciertas afecciones y elecciones de estilo de vida pueden hacer que sea ms propenso a sufrir Engineer, manufacturing. El mdico puede recomendarle lo siguiente:  Controles regulares de la visin. Una visin deficiente y afecciones como las cataratas pueden hacer que sea ms propenso a sufrir Engineer, manufacturing. Si [place] lentes, asegrese de obtener una receta actualizada si su visin  cambia.  Revisin de medicamentos. Revise regularmente con el mdico todos los medicamentos que toma, incluidos los medicamentos de [place]. Consulte al [employer] efectos secundarios que pueden hacer que sea ms propenso a sufrir Engineer, manufacturing. Informe al mdico si alguno de los medicamentos que toma lo hace sentir mareado o somnoliento.  Pruebas de deteccin para la osteoporosis. La osteoporosis es una afeccin que hace que los huesos se vuelvan ms frgiles. En consecuencia, los huesos pueden debilitarse y quebrarse ms fcilmente.  Pruebas de deteccin para la presin arterial. Los cambios en la presin arterial y los medicamentos para Chief Technology Officer la presin arterial pueden hacerlo sentir mareado.  Controles de fuerza y equilibrio. El mdico puede recomendar ciertos estudios para controlar su fuerza y equilibrio al estar de pie, al caminar o al cambiar de posicin.  Examen de los pies. El dolor y Chiropractor en los pies, como tambin no utilizar el calzado [place], pueden hacer que sea ms propenso a sufrir Engineer, manufacturing.  Prueba de deteccin de la depresin. Es ms probable que sufra una cada si tiene miedo a caerse, se siente mal emocionalmente o se siente incapaz de Patent examiner.  Prueba de deteccin de consumo de alcohol. Beber demasiado alcohol puede afectar su equilibrio y puede hacer que sea ms propenso a sufrir Engineer, manufacturing. Qu medidas puedo tomar para reducir mi riesgo de sufrir una cada? Instrucciones generales  Hable con el mdico sobre sus riesgos de sufrir una cada. Infrmele a su mdico si: ? Se cae. Asegrese de informarle a su mdico acerca de todas las cadas, incluso  aquellas que parecen ser [employer] ? Se siente mareado, somnoliento o que pierde el equilibrio.  Tome los medicamentos de venta libre y los recetados solamente como se lo haya indicado el mdico. Estos incluyen todos los suplementos.  Siga una dieta sana y [place] un peso  saludable. Una dieta saludable incluye productos lcteos descremados, carnes bajas en contenido de grasa ([place], [place] de granos enteros, frijoles y [place] frutas y verduras. La seguridad en el hogar  Retire los objetos que puedan causar tropiezos tales como alfombras, cables u obstculos.  Instale equipos de seguridad, como barras para sostn en los baos y barandas de seguridad en las escaleras.  Britt habitaciones y los pasillos bien iluminados. Actividad   Siga un programa de ejercicio regular para mantenerse en forma. Esto lo ayudar a Contractor equilibrio. Consulte al mdico qu tipos de ejercicios son adecuados para usted.  Si necesita un bastn o un andador, selo segn las recomendaciones del mdico.  Utilice calzado con buen apoyo y suela antideslizante. Estilo de vida  No beba alcohol si el mdico le indica que no beba.  Si bebe alcohol, limite la cantidad que consume: ? De 0 a 1 medida por da para las mujeres. ? De 0 a 2 medidas por da para los hombres.  Est atento a la cantidad de alcohol que contiene su bebida. En los [place] una medida equivale a una botella tpica de cerveza (12 onzas), media copa de vino (5 onzas) o una medida de bebida blanca (1 onza).  No consuma ningn producto que contenga nicotina o tabaco, como cigarrillos y Psychologist, sport and exercise. Si necesita ayuda para dejar de fumar, consulte al mdico. Resumen  Tener un estilo de vida saludable y recibir cuidados preventivos pueden ayudar a Theatre stage manager salud y el bienestar despus de los 63 aos de [place].  Realizarse pruebas de deteccin y [employer] es la mejor manera de Hydrographic surveyor un problema de salud de forma temprana y Lourena Simmonds a Product/process development scientist una cada. El diagnstico y tratamiento tempranos le brindan la mejor oportunidad de Chief Technology Officer las afecciones mdicas ms comunes en las personas mayores de 67 aos de edad.  Las cadas son la causa principal de las fracturas de huesos y lesiones en la cabeza  de personas mayores de 17 aos de edad. Tome precauciones para evitar una cada en su casa.  Trabaje con el mdico para saber qu cambios que puede hacer para mejorar su salud y [place], y [place]. Esta informacin no tiene Marine scientist el consejo del mdico. Asegrese de hacerle al mdico cualquier pregunta que tenga. Document Released: 03/01/2017 Document Revised: 03/01/2017 Document Reviewed: 03/01/2017 Elsevier Patient Education  2020 [employer].

## 2018-12-23 NOTE — Assessment & Plan Note (Signed)
Stable period on terazosin, continue.

## 2018-12-24 LAB — COMPREHENSIVE METABOLIC PANEL
ALT: 26 U/L (ref 0–53)
AST: 30 U/L (ref 0–37)
Albumin: 4.4 g/dL (ref 3.5–5.2)
Alkaline Phosphatase: 32 U/L — ABNORMAL LOW (ref 39–117)
BUN: 22 mg/dL (ref 6–23)
CO2: 32 mEq/L (ref 19–32)
Calcium: 9.8 mg/dL (ref 8.4–10.5)
Chloride: 101 mEq/L (ref 96–112)
Creatinine, Ser: 1.04 mg/dL (ref 0.40–1.50)
GFR: 68.95 mL/min (ref >60.00)
Glucose, Bld: 95 mg/dL (ref 70–99)
Potassium: 4 mEq/L (ref 3.5–5.1)
Sodium: 137 mEq/L (ref 135–145)
Total Bilirubin: 0.4 mg/dL (ref 0.2–1.2)
Total Protein: 7.3 g/dL (ref 6.0–8.3)

## 2018-12-24 LAB — LIPID PANEL
Cholesterol: 145 mg/dL (ref 0–200)
HDL: 54 mg/dL (ref >39.00)
LDL Cholesterol: 75 mg/dL (ref 0–99)
NonHDL: 91.21
Total CHOL/HDL Ratio: 3
Triglycerides: 81 mg/dL (ref 0.0–149.0)
VLDL: 16.2 mg/dL (ref 0.0–40.0)

## 2019-02-12 ENCOUNTER — Encounter: Payer: Self-pay | Admitting: Family Medicine

## 2019-02-27 ENCOUNTER — Other Ambulatory Visit: Payer: Self-pay | Admitting: Family Medicine

## 2019-02-27 DIAGNOSIS — E781 Pure hyperglyceridemia: Secondary | ICD-10-CM

## 2019-06-10 ENCOUNTER — Ambulatory Visit: Payer: Medicare Other | Admitting: Family Medicine

## 2019-06-16 ENCOUNTER — Ambulatory Visit: Payer: Medicare Other | Admitting: Family Medicine

## 2019-06-24 ENCOUNTER — Encounter: Payer: Self-pay | Admitting: Family Medicine

## 2019-06-24 ENCOUNTER — Other Ambulatory Visit: Payer: Self-pay

## 2019-06-24 ENCOUNTER — Ambulatory Visit (INDEPENDENT_AMBULATORY_CARE_PROVIDER_SITE_OTHER): Payer: Medicare Other | Admitting: Family Medicine

## 2019-06-24 VITALS — BP 140/76 | HR 61 | Temp 97.8°F | Ht 64.5 in | Wt 158.1 lb

## 2019-06-24 DIAGNOSIS — E785 Hyperlipidemia, unspecified: Secondary | ICD-10-CM | POA: Diagnosis not present

## 2019-06-24 DIAGNOSIS — E118 Type 2 diabetes mellitus with unspecified complications: Secondary | ICD-10-CM | POA: Diagnosis not present

## 2019-06-24 DIAGNOSIS — E1159 Type 2 diabetes mellitus with other circulatory complications: Secondary | ICD-10-CM | POA: Diagnosis not present

## 2019-06-24 DIAGNOSIS — I10 Essential (primary) hypertension: Secondary | ICD-10-CM

## 2019-06-24 DIAGNOSIS — IMO0002 Reserved for concepts with insufficient information to code with codable children: Secondary | ICD-10-CM

## 2019-06-24 DIAGNOSIS — E1165 Type 2 diabetes mellitus with hyperglycemia: Secondary | ICD-10-CM | POA: Diagnosis not present

## 2019-06-24 DIAGNOSIS — E1169 Type 2 diabetes mellitus with other specified complication: Secondary | ICD-10-CM | POA: Diagnosis not present

## 2019-06-24 DIAGNOSIS — D696 Thrombocytopenia, unspecified: Secondary | ICD-10-CM

## 2019-06-24 LAB — CBC WITH DIFFERENTIAL/PLATELET
Basophils Absolute: 0 10*3/uL (ref 0.0–0.1)
Basophils Relative: 0.6 % (ref 0.0–3.0)
Eosinophils Absolute: 0.1 10*3/uL (ref 0.0–0.7)
Eosinophils Relative: 2.8 % (ref 0.0–5.0)
HCT: 41.5 % (ref 39.0–52.0)
Hemoglobin: 13.9 g/dL (ref 13.0–17.0)
Lymphocytes Relative: 32.9 % (ref 12.0–46.0)
Lymphs Abs: 1.6 10*3/uL (ref 0.7–4.0)
MCHC: 33.5 g/dL (ref 30.0–36.0)
MCV: 90.9 fl (ref 78.0–100.0)
Monocytes Absolute: 0.5 10*3/uL (ref 0.1–1.0)
Monocytes Relative: 9.5 % (ref 3.0–12.0)
Neutro Abs: 2.7 10*3/uL (ref 1.4–7.7)
Neutrophils Relative %: 54.2 % (ref 43.0–77.0)
Platelets: 127 10*3/uL — ABNORMAL LOW (ref 150.0–400.0)
RBC: 4.56 Mil/uL (ref 4.22–5.81)
RDW: 13 % (ref 11.5–15.5)
WBC: 4.9 10*3/uL (ref 4.0–10.5)

## 2019-06-24 LAB — BASIC METABOLIC PANEL
BUN: 32 mg/dL — ABNORMAL HIGH (ref 6–23)
CO2: 30 mEq/L (ref 19–32)
Calcium: 9.6 mg/dL (ref 8.4–10.5)
Chloride: 102 mEq/L (ref 96–112)
Creatinine, Ser: 1.15 mg/dL (ref 0.40–1.50)
GFR: 61.32 mL/min (ref >60.00)
Glucose, Bld: 142 mg/dL — ABNORMAL HIGH (ref 70–99)
Potassium: 4.1 mEq/L (ref 3.5–5.1)
Sodium: 138 mEq/L (ref 135–145)

## 2019-06-24 LAB — LIPID PANEL
Cholesterol: 172 mg/dL (ref 0–200)
HDL: 52.2 mg/dL (ref >39.00)
LDL Cholesterol: 100 mg/dL — ABNORMAL HIGH (ref 0–99)
NonHDL: 119.62
Total CHOL/HDL Ratio: 3
Triglycerides: 99 mg/dL (ref 0.0–149.0)
VLDL: 19.8 mg/dL (ref 0.0–40.0)

## 2019-06-24 LAB — POCT GLYCOSYLATED HEMOGLOBIN (HGB A1C): Hemoglobin A1C: 7.8 % — AB (ref 4.0–5.6)

## 2019-06-24 MED ORDER — ASPIRIN EC 81 MG PO TBEC: 81.0000 mg | DELAYED_RELEASE_TABLET | ORAL | Status: AC

## 2019-06-24 NOTE — Assessment & Plan Note (Signed)
Chronic, stable on current regimen.  

## 2019-06-24 NOTE — Assessment & Plan Note (Signed)
Chronic, stable on fibrate and statin - continue. The 10-year ASCVD risk score Mikey Bussing DC Brooke Bonito., et al., 2013) is: 59.6%   Values used to calculate the score:     Age: 79 years     Sex: Male     Is Non-Hispanic African American: No     Diabetic: Yes     Tobacco smoker: No     Systolic Blood Pressure: XX123456 mmHg     Is BP treated: Yes     HDL Cholesterol: 54 mg/dL     Total Cholesterol: 145 mg/dL

## 2019-06-24 NOTE — Assessment & Plan Note (Addendum)
S/p reassuring hematology evaluation.  rec PCP monitor yearly.  Check CBC today.  Will decrease aspirin to MWF dosing.

## 2019-06-24 NOTE — Progress Notes (Signed)
This visit was conducted in person.  BP 140/76 (BP Location: Right Arm, Patient Position: Sitting, Cuff Size: Normal)   Pulse 61   Temp 97.8 F (36.6 C) (Temporal)   Ht 5' 4.5" (1.638 m)   Wt 158 lb 2 oz (71.7 kg)   SpO2 95%   BMI 26.72 kg/m    CC: 6 mo f/u visit  Subjective:    Patient ID: Ricky Garrett, male    DOB: [birth year], 79 y.o.   MRN: [id number]  HPI: Ricky Garrett is a 79 y.o. male presenting on 06/24/2019 for Follow-up (Here for HTN, chol and DM f/u.  Pt accompanied by daughter, [name]- temp 97.8.)   Completed [place] vaccines 03/2019.  Completed shingrix vaccine  HLD - on lovastatin 32m daily.   HTN - compliant with lisinopril hctz 20/266mdaily. Denies HA, vision changes, CP/tightness, SOB, leg swelling. Home BP readings overall well controlled.   DM - does regularly check sugars 120-130s. Compliant with antihyperglycemic regimen which includes: metformin 100019mR daily. Denies low sugars or hypoglycemic symptoms. Denies paresthesias. Last diabetic eye exam recently. Pneumovax: 2017. Prevnar: 2020. Glucometer brand: accu-chek. DSME: interested.  Lab Results  Component Value Date   HGBA1C 7.8 (A) 06/24/2019   Diabetic Foot Exam - Simple   No data filed     No results found for: MICDerl BarrowUpcoming GI apt 07/03/2019.      Relevant past medical, surgical, family and social history reviewed and updated as indicated. Interim medical history since our last visit reviewed. Allergies and medications reviewed and updated. Outpatient Medications Prior to Visit  Medication Sig Dispense Refill  . Accu-Chek Softclix Lancets lancets 1 each by Other route as directed. Use as instructed to check blood sugar.  Dx code:  E11.8 100 each 0  . Blood Glucose Monitoring Suppl (ACCU-CHEK AVIVA PLUS) w/Device KIT Use as directed to check sugars daily and as needed E11.8 1 kit 0  . famotidine (PEPCID) 20 MG tablet Take 1 tablet (20 mg total) by mouth daily. 90 tablet  3  . fenofibrate (TRICOR) 145 MG tablet TAKE 1 TABLET BY MOUTH EVERY DAY 90 tablet 3  . glucose blood (ACCU-CHEK AVIVA PLUS) test strip Use as instructed E11.8 100 each 3  . lisinopril-hydrochlorothiazide (ZESTORETIC) 20-25 MG tablet Take 1 tablet by mouth daily. 90 tablet 3  . lovastatin (MEVACOR) 20 MG tablet Take 1 tablet (20 mg total) by mouth at bedtime. Suspend Gemfibrozil when taking this medication. 90 tablet 3  . metFORMIN (GLUCOPHAGE XR) 500 MG 24 hr tablet Take 2 tablets (1,000 mg total) by mouth daily with breakfast. 180 tablet 3  . Multiple Vitamins-Minerals (CENTRUM SILVER) tablet Take 1 tablet by mouth daily.    . tMarland Kitchenrazosin (HYTRIN) 1 MG capsule Take 1 capsule (1 mg total) by mouth at bedtime. 90 capsule 3  . aspirin EC 81 MG tablet Take 1 tablet (81 mg total) by mouth daily. 100 tablet 6   No facility-administered medications prior to visit.     Per HPI unless specifically indicated in ROS section below Review of Systems Objective:  BP 140/76 (BP Location: Right Arm, Patient Position: Sitting, Cuff Size: Normal)   Pulse 61   Temp 97.8 F (36.6 C) (Temporal)   Ht 5' 4.5" (1.638 m)   Wt 158 lb 2 oz (71.7 kg)   SpO2 95%   BMI 26.72 kg/m   Wt Readings from Last 3 Encounters:  06/24/19 158 lb 2 oz (71.7 kg)  12/23/18  154 lb 1 oz (69.9 kg)  08/30/18 161 lb 9 oz (73.3 kg)      Physical Exam Vitals and nursing note reviewed.  Constitutional:      General: He is not in acute distress.    Appearance: Normal appearance. He is well-developed. He is not ill-appearing.  HENT:     Head: Normocephalic and atraumatic.     Right Ear: External ear normal.     Left Ear: External ear normal.     Nose: Nose normal.     Mouth/Throat:     Pharynx: No oropharyngeal exudate.  Eyes:     General: No scleral icterus.    Conjunctiva/sclera: Conjunctivae normal.     Pupils: Pupils are equal, round, and reactive to light.  Neck:     Vascular: No carotid bruit.  Cardiovascular:      Rate and Rhythm: Normal rate and regular rhythm.     Pulses: Normal pulses.     Heart sounds: Normal heart sounds. No murmur.  Pulmonary:     Effort: Pulmonary effort is normal. No respiratory distress.     Breath sounds: Normal breath sounds. No wheezing, rhonchi or rales.  Musculoskeletal:     Cervical back: Normal range of motion and neck supple.     Comments: See HPI for foot exam if done  Skin:    General: Skin is warm and dry.     Findings: No rash.  Neurological:     Mental Status: He is alert.       Results for orders placed or performed in visit on 06/24/19  POCT glycosylated hemoglobin (Hb A1C)  Result Value Ref Range   Hemoglobin A1C 7.8 (A) 4.0 - 5.6 %   HbA1c POC (<> result, manual entry)     HbA1c, POC (prediabetic range)     HbA1c, POC (controlled diabetic range)     Assessment & Plan:  This visit occurred during the SARS-CoV-2 public health emergency.  Safety protocols were in place, including screening questions prior to the visit, additional usage of staff PPE, and extensive cleaning of exam room while observing appropriate contact time as indicated for disinfecting solutions.   Problem List Items Addressed This Visit    Thrombocytopenia ([place])    S/p reassuring hematology evaluation.  rec PCP monitor yearly.  Check CBC today.  Will decrease aspirin to MWF dosing.       Relevant Orders   CBC with Differential/Platelet   Hypertension associated with diabetes ([place])    Chronic, stable on current regimen.       Relevant Medications   aspirin EC 81 MG tablet (Start on 06/25/2019)   Dyslipidemia associated with type 2 diabetes mellitus (HCC)    Chronic, stable on fibrate and statin - continue. The 10-year ASCVD risk score Mikey Bussing DC Brooke Bonito., et al., 2013) is: 59.6%   Values used to calculate the score:     Age: 49 years     Sex: Male     Is Non-Hispanic African American: No     Diabetic: Yes     Tobacco smoker: No     Systolic Blood Pressure: 366 mmHg     Is  BP treated: Yes     HDL Cholesterol: 54 mg/dL     Total Cholesterol: 145 mg/dL       Relevant Medications   aspirin EC 81 MG tablet (Start on 06/25/2019)   Other Relevant Orders   Lipid panel   DM (diabetes mellitus), type 2, uncontrolled with complications (  [place]) - Primary    Chronic, deteriorated despite metformin XR. Agrees to diabetes education classes - referral placed, will request in [place]. Encouraged renewed efforts to follow diabetic diet.       Relevant Medications   aspirin EC 81 MG tablet (Start on 06/25/2019)   Other Relevant Orders   POCT glycosylated hemoglobin (Hb A1C) (Completed)   Basic metabolic panel   Ambulatory referral to diabetic education       Meds ordered this encounter  Medications  . aspirin EC 81 MG tablet    Sig: Take 1 tablet (81 mg total) by mouth every Monday, Wednesday, and Friday.   Orders Placed This Encounter  Procedures  . Basic metabolic panel  . CBC with Differential/Platelet  . Lipid panel  . Ambulatory referral to diabetic education    Referral Priority:   Routine    Referral Type:   Consultation    Referral Reason:   Specialty Services Required    Number of Visits Requested:   1  . POCT glycosylated hemoglobin (Hb A1C)    Patient Instructions  Laboratorios hoy.  Lo remitire a clases de diabetes  Su azucar estaba un poco elevada hoy (A1c 7.8%). Meta es idealmente <7%. siga dieta baja en azucar, y carbohidratos.  Baje dosis de aspirina a 3 veces a la semana.  Regresar en 6 meses para proxima visita de salud de medicare.    Follow up plan: Return in about 6 months (around 12/25/2019) for medicare wellness visit.  Ria Bush, MD

## 2019-06-24 NOTE — Patient Instructions (Addendum)
Laboratorios hoy.  Lo remitire a clases de diabetes  Su azucar estaba un poco elevada hoy (A1c 7.8%). Meta es idealmente <7%. siga dieta baja en azucar, y carbohidratos.  Baje dosis de aspirina a 3 veces a la semana.  Regresar en 6 meses para proxima visita de salud de medicare.

## 2019-06-24 NOTE — Assessment & Plan Note (Addendum)
Chronic, deteriorated despite metformin XR. Agrees to diabetes education classes - referral placed, will request in [place]. Encouraged renewed efforts to follow diabetic diet.

## 2019-06-28 ENCOUNTER — Encounter: Payer: Self-pay | Admitting: Family Medicine

## 2019-07-04 ENCOUNTER — Other Ambulatory Visit: Payer: Self-pay | Admitting: Gastroenterology

## 2019-07-04 DIAGNOSIS — R933 Abnormal findings on diagnostic imaging of other parts of digestive tract: Secondary | ICD-10-CM

## 2019-07-04 DIAGNOSIS — K5140 Inflammatory polyps of colon without complications: Secondary | ICD-10-CM

## 2019-07-04 DIAGNOSIS — K5790 Diverticulosis of intestine, part unspecified, without perforation or abscess without bleeding: Secondary | ICD-10-CM

## 2019-07-14 ENCOUNTER — Other Ambulatory Visit: Payer: Self-pay | Admitting: Family Medicine

## 2019-08-06 ENCOUNTER — Telehealth: Payer: Medicare Other | Admitting: Skilled Nursing Facility1

## 2019-08-08 ENCOUNTER — Ambulatory Visit
Admission: RE | Admit: 2019-08-08 | Discharge: 2019-08-08 | Disposition: A | Discharge status: Home / Self Care | Payer: Medicare Other | Source: Ambulatory Visit | Attending: Gastroenterology | Admitting: Gastroenterology

## 2019-08-08 DIAGNOSIS — K5790 Diverticulosis of intestine, part unspecified, without perforation or abscess without bleeding: Secondary | ICD-10-CM

## 2019-08-08 DIAGNOSIS — R933 Abnormal findings on diagnostic imaging of other parts of digestive tract: Secondary | ICD-10-CM

## 2019-08-08 DIAGNOSIS — K5140 Inflammatory polyps of colon without complications: Secondary | ICD-10-CM

## 2019-08-26 ENCOUNTER — Encounter: Payer: Medicare Other | Attending: Family Medicine | Admitting: Dietician

## 2019-08-26 ENCOUNTER — Encounter: Payer: Self-pay | Admitting: Dietician

## 2019-08-26 DIAGNOSIS — E1165 Type 2 diabetes mellitus with hyperglycemia: Secondary | ICD-10-CM | POA: Diagnosis present

## 2019-08-26 DIAGNOSIS — E118 Type 2 diabetes mellitus with unspecified complications: Secondary | ICD-10-CM | POA: Insufficient documentation

## 2019-08-26 DIAGNOSIS — IMO0002 Reserved for concepts with insufficient information to code with codable children: Secondary | ICD-10-CM

## 2019-08-26 NOTE — Progress Notes (Signed)
Diabetes Self-Management Education  MyChart Visit I confirmed I was speaking with the correct person by verifying the patient's name and date of birth.  Patient's primary language is spanish. Patient's daughter was present with him during today's visit to interpret.    Visit Type: First/Initial  Appt. Start Time: 9:30am   Appt. End Time: 10:30am  08/26/2019  Ricky Garrett, identified by name and date of birth, is a 79 y.o. male with a diagnosis of Diabetes: Type 2.    ASSESSMENT  Typical meal pattern is 3 meals per day, plus maybe 1-2 snacks. Patient and his daughter state he eats a lot of carbohydrate foods, such as rice, tortillas, potatoes, and bread. Patient consumes a variety of foods/food groups. Patient states he walks every day for physical activity. States his wife and daughters are very supportive of him.    Diabetes Self-Management Education - 08/26/19 0932      Visit Information   Visit Type First/Initial      Initial Visit   Diabetes Type Type 2    Are you currently following a meal plan? No    Are you taking your medications as prescribed? Yes    Date Diagnosed 7-8 years      Psychosocial Assessment   Patient Belief/Attitude about Diabetes Motivated to manage diabetes    Self-care barriers English as a second language    Other persons present Family Member   wife, daughters   Preferred Learning Style No preference indicated      Complications   Last HgB A1C per patient/outside source 7.8 %   06/24/2019 (pt's chart)   How often do you check your blood sugar? 1-2 times/day    Fasting Blood glucose range (mg/dL) 70-129;130-179    Have you had a dilated eye exam in the past 12 months? Yes    Have you had a dental exam in the past 12 months? No      Dietary Intake   Breakfast egg + potato w/ chives & cilantro    Snack (morning) corn bread    Lunch pot roast + rice + fried plantain + salad    Dinner soup    Snack (evening) peanuts   or cookie, or crackers    Beverage(s) water, Crystal Light, coffee w/ Truvia sugar      Exercise   Exercise Type Light (walking / raking leaves)    How many days per week to you exercise? 7    How many minutes per day do you exercise? 30    Total minutes per week of exercise 210      Patient Education   Previous Diabetes Education No    Disease state  Definition of diabetes, type 1 and 2, and the diagnosis of diabetes;Explored patient's options for treatment of their diabetes    Nutrition management  Food label reading, portion sizes and measuring food.;Meal options for control of blood glucose level and chronic complications.    Physical activity and exercise  Role of exercise on diabetes management, blood pressure control and cardiac health.    Monitoring Purpose and frequency of SMBG.;Yearly dilated eye exam    Acute complications Discussed and identified patients' treatment of hyperglycemia.;Taught treatment of hypoglycemia - the 15 rule.    Chronic complications Dental care;Retinopathy and reason for yearly dilated eye exams    Psychosocial adjustment Role of stress on diabetes;Helped patient identify a support system for diabetes management      Individualized Goals (developed by patient)   Nutrition  General guidelines for healthy choices and portions discussed    Medications take my medication as prescribed      Outcomes   Expected Outcomes Demonstrated interest in learning. Expect positive outcomes           Individualized Plan for Diabetes Self-Management Training:  Learning Objective:  Patient will have a greater understanding of diabetes self-management. Patient education plan is to attend individual and/or group sessions per assessed needs and concerns.  Expected Outcomes:  Demonstrated interest in learning. Expect positive outcomes  Education material provided: 4 Steps to Control Your Diabetes (Spanish); MyPlate (Spanish)  -emailed to pt at: [email or web address]  If problems or questions,  patient to contact team via:  Phone and Email

## 2019-09-11 ENCOUNTER — Other Ambulatory Visit: Payer: Self-pay | Admitting: Family Medicine

## 2019-09-11 DIAGNOSIS — E118 Type 2 diabetes mellitus with unspecified complications: Secondary | ICD-10-CM

## 2019-09-11 DIAGNOSIS — E781 Pure hyperglyceridemia: Secondary | ICD-10-CM

## 2019-09-11 DIAGNOSIS — I10 Essential (primary) hypertension: Secondary | ICD-10-CM

## 2019-09-23 LAB — HM DIABETES EYE EXAM

## 2019-09-26 ENCOUNTER — Encounter: Payer: Self-pay | Admitting: Family Medicine

## 2019-10-12 ENCOUNTER — Other Ambulatory Visit: Payer: Self-pay | Admitting: Family Medicine

## 2019-10-12 DIAGNOSIS — IMO0002 Reserved for concepts with insufficient information to code with codable children: Secondary | ICD-10-CM

## 2019-10-16 ENCOUNTER — Other Ambulatory Visit: Payer: Self-pay | Admitting: Family Medicine

## 2019-11-11 ENCOUNTER — Ambulatory Visit: Payer: Medicare Other

## 2019-11-18 ENCOUNTER — Other Ambulatory Visit: Payer: Self-pay

## 2019-11-18 ENCOUNTER — Encounter: Payer: Self-pay | Admitting: Family Medicine

## 2019-11-18 ENCOUNTER — Ambulatory Visit (INDEPENDENT_AMBULATORY_CARE_PROVIDER_SITE_OTHER): Payer: Medicare Other | Admitting: Family Medicine

## 2019-11-18 VITALS — BP 130/70 | HR 83 | Temp 97.9°F | Ht 64.5 in | Wt 159.0 lb

## 2019-11-18 DIAGNOSIS — E781 Pure hyperglyceridemia: Secondary | ICD-10-CM

## 2019-11-18 DIAGNOSIS — E1165 Type 2 diabetes mellitus with hyperglycemia: Secondary | ICD-10-CM | POA: Diagnosis not present

## 2019-11-18 DIAGNOSIS — I499 Cardiac arrhythmia, unspecified: Secondary | ICD-10-CM | POA: Insufficient documentation

## 2019-11-18 DIAGNOSIS — E118 Type 2 diabetes mellitus with unspecified complications: Secondary | ICD-10-CM

## 2019-11-18 DIAGNOSIS — H9193 Unspecified hearing loss, bilateral: Secondary | ICD-10-CM

## 2019-11-18 DIAGNOSIS — IMO0002 Reserved for concepts with insufficient information to code with codable children: Secondary | ICD-10-CM

## 2019-11-18 DIAGNOSIS — E1169 Type 2 diabetes mellitus with other specified complication: Secondary | ICD-10-CM | POA: Diagnosis not present

## 2019-11-18 DIAGNOSIS — E785 Hyperlipidemia, unspecified: Secondary | ICD-10-CM

## 2019-11-18 DIAGNOSIS — E1159 Type 2 diabetes mellitus with other circulatory complications: Secondary | ICD-10-CM

## 2019-11-18 DIAGNOSIS — I152 Hypertension secondary to endocrine disorders: Secondary | ICD-10-CM

## 2019-11-18 LAB — POCT GLYCOSYLATED HEMOGLOBIN (HGB A1C): Hemoglobin A1C: 7.7 % — AB (ref 4.0–5.6)

## 2019-11-18 MED ORDER — METFORMIN HCL ER 500 MG PO TB24: 1000.0000 mg | ORAL_TABLET | Freq: Every day | ORAL | 1 refills | Status: DC

## 2019-11-18 MED ORDER — FENOFIBRATE 145 MG PO TABS: 145.0000 mg | ORAL_TABLET | Freq: Every day | ORAL | 1 refills | Status: DC

## 2019-11-18 MED ORDER — TERAZOSIN HCL 1 MG PO CAPS: 1.0000 mg | ORAL_CAPSULE | Freq: Every day | ORAL | 1 refills | Status: DC

## 2019-11-18 MED ORDER — LOVASTATIN 20 MG PO TABS: ORAL_TABLET | ORAL | 1 refills | Status: DC

## 2019-11-18 MED ORDER — FAMOTIDINE 20 MG PO TABS: 20.0000 mg | ORAL_TABLET | Freq: Every day | ORAL | 1 refills | Status: DC

## 2019-11-18 MED ORDER — LISINOPRIL-HYDROCHLOROTHIAZIDE 20-25 MG PO TABS: 1.0000 | ORAL_TABLET | Freq: Every day | ORAL | 1 refills | Status: DC

## 2019-11-18 NOTE — Assessment & Plan Note (Signed)
Check EKG - not consistent with afib but rather PACs. Pt asxs.

## 2019-11-18 NOTE — Progress Notes (Signed)
This visit was conducted in person.  BP 130/70 (BP Location: Left Arm, Patient Position: Sitting, Cuff Size: Normal)   Pulse 83   Temp 97.9 F (36.6 C) (Temporal)   Ht 5' 4.5" (1.638 m)   Wt 159 lb (72.1 kg)   SpO2 98%   BMI 26.87 kg/m    CC: DM /fu visit  Subjective:    Patient ID: Ricky Garrett, male    DOB: 08/23/1940, 79 y.o.   MRN: [id number]  HPI: Ricky Garrett is a 79 y.o. male presenting on 11/18/2019 for Diabetes (Here for f/u.)   Has stopped aspirin.  Noticing increasing difficulty hearing over past 3 months. Notes tinnitus.   DM - does regularly check sugars fasting in the morning 135. Compliant with antihyperglycemic regimen which includes: metformin XR 1072m daily. Denies low sugars or hypoglycemic symptoms. Occasional cramping. Denies paresthesias. Last diabetic eye exam 08/2019. Pneumovax: 2017, 2011. Prevnar: 2020. Glucometer brand: accuchek aviva. DSME: completed recently. Lab Results  Component Value Date   HGBA1C 7.7 (A) 11/18/2019   Diabetic Foot Exam - Simple   No data filed     No results found for: MDerl Barrow   He received colon cancer screening this year through [place]. Had virtual colonoscopy showing no signs of polyp, as well as 2.4cm hypodensity to R kidney likely cyst consider renal [place]      Relevant past medical, surgical, family and social history reviewed and updated as indicated. Interim medical history since our last visit reviewed. Allergies and medications reviewed and updated. Outpatient Medications Prior to Visit  Medication Sig Dispense Refill  . Accu-Chek Softclix Lancets lancets 1 EACH BY OTHER ROUTE AS DIRECTED. USE AS INSTRUCTED TO CHECK BLOOD SUGAR. 100 each 3  . Blood Glucose Monitoring Suppl (ACCU-CHEK AVIVA PLUS) w/Device KIT Use as directed to check sugars daily and as needed E11.8 1 kit 0  . glucose blood (ACCU-CHEK AVIVA PLUS) test strip Use as instructed to check blood sugar once daily 100  strip 3  . Multiple Vitamins-Minerals (CENTRUM SILVER) tablet Take 1 tablet by mouth daily.    . famotidine (PEPCID) 20 MG tablet Take 1 tablet (20 mg total) by mouth daily. 90 tablet 3  . fenofibrate (TRICOR) 145 MG tablet TAKE 1 TABLET BY MOUTH EVERY DAY 90 tablet 3  . lisinopril-hydrochlorothiazide (ZESTORETIC) 20-25 MG tablet TAKE 1 TABLET BY MOUTH EVERY DAY 90 tablet 1  . lovastatin (MEVACOR) 20 MG tablet TAKE 1 TABLET BY MOUTH AT BEDTIME SUSPEND GEMFIBROZIL WHILE TAKING THIS MED 90 tablet 1  . metFORMIN (GLUCOPHAGE-XR) 500 MG 24 hr tablet TAKE 2 TABLETS BY MOUTH EVERY DAY WITH BREAKFAST 180 tablet 1  . terazosin (HYTRIN) 1 MG capsule TAKE 1 CAPSULE BY MOUTH AT BEDTIME 90 capsule 1  . aspirin EC 81 MG tablet Take 1 tablet (81 mg total) by mouth every Monday, Wednesday, and Friday. (Patient not taking: Reported on 11/18/2019)     No facility-administered medications prior to visit.     Per HPI unless specifically indicated in ROS section below Review of Systems Objective:  BP 130/70 (BP Location: Left Arm, Patient Position: Sitting, Cuff Size: Normal)   Pulse 83   Temp 97.9 F (36.6 C) (Temporal)   Ht 5' 4.5" (1.638 m)   Wt 159 lb (72.1 kg)   SpO2 98%   BMI 26.87 kg/m   Wt Readings from Last 3 Encounters:  11/18/19 159 lb (72.1 kg)  06/24/19 158 lb 2 oz (71.7 kg)  12/23/18 154 lb 1 oz (69.9 kg)      Physical Exam Vitals and nursing note reviewed.  Constitutional:      Appearance: Normal appearance. He is not ill-appearing.  Cardiovascular:     Rate and Rhythm: Normal rate. Rhythm irregularly irregular.     Pulses: Normal pulses.     Heart sounds: No murmur heard.   Pulmonary:     Effort: Pulmonary effort is normal. No respiratory distress.     Breath sounds: Normal breath sounds. No wheezing, rhonchi or rales.  Musculoskeletal:     Right lower leg: No edema.     Left lower leg: No edema.  Neurological:     Mental Status: He is alert.  Psychiatric:        Mood and  Affect: Mood normal.        Behavior: Behavior normal.       Results for orders placed or performed in visit on 11/18/19  POCT glycosylated hemoglobin (Hb A1C)  Result Value Ref Range   Hemoglobin A1C 7.7 (A) 4.0 - 5.6 %   HbA1c POC (<> result, manual entry)     HbA1c, POC (prediabetic range)     HbA1c, POC (controlled diabetic range)     EKG - sinus arrhythmia with frequent PACs, normal axis, intervals, no acute ST/T changes.  Assessment & Plan:  This visit occurred during the SARS-CoV-2 public health emergency.  Safety protocols were in place, including screening questions prior to the visit, additional usage of staff PPE, and extensive cleaning of exam room while observing appropriate contact time as indicated for disinfecting solutions.   Problem List Items Addressed This Visit    Irregular heart beat    Check EKG - not consistent with afib but rather PACs. Pt asxs.       Relevant Orders   EKG 12-Lead (Completed)   Hypertriglyceridemia   Relevant Medications   fenofibrate (TRICOR) 145 MG tablet   lisinopril-hydrochlorothiazide (ZESTORETIC) 20-25 MG tablet   lovastatin (MEVACOR) 20 MG tablet   terazosin (HYTRIN) 1 MG capsule   Hypertension associated with diabetes ([place])    zestoretic refilled.       Relevant Medications   fenofibrate (TRICOR) 145 MG tablet   lisinopril-hydrochlorothiazide (ZESTORETIC) 20-25 MG tablet   lovastatin (MEVACOR) 20 MG tablet   metFORMIN (GLUCOPHAGE-XR) 500 MG 24 hr tablet   terazosin (HYTRIN) 1 MG capsule   Dyslipidemia associated with type 2 diabetes mellitus (HCC)    Lovastatin and fibrate refilled - sent in enough for upcoming 5 month trip out of the country - will see if pharmacy will fill.       Relevant Medications   fenofibrate (TRICOR) 145 MG tablet   lisinopril-hydrochlorothiazide (ZESTORETIC) 20-25 MG tablet   lovastatin (MEVACOR) 20 MG tablet   metFORMIN (GLUCOPHAGE-XR) 500 MG 24 hr tablet   DM (diabetes mellitus), type 2,  uncontrolled with complications (HCC) - Primary    Chronic, adequate but not ideal control on metformin XR 1024m daily. No changes made today -encouraged renewed efforts at following diabetic diet.       Relevant Medications   lisinopril-hydrochlorothiazide (ZESTORETIC) 20-25 MG tablet   lovastatin (MEVACOR) 20 MG tablet   metFORMIN (GLUCOPHAGE-XR) 500 MG 24 hr tablet   Other Relevant Orders   POCT glycosylated hemoglobin (Hb A1C) (Completed)   Decreased hearing of both ears    Canals clear. Progressive trouble noted by pt and family. Will refer for formal audiology eval.      Relevant Orders  Ambulatory referral to Audiology       Meds ordered this encounter  Medications  . famotidine (PEPCID) 20 MG tablet    Sig: Take 1 tablet (20 mg total) by mouth daily.    Dispense:  150 tablet    Refill:  1    Please fill 5 mo supply due to travel out of country  . fenofibrate (TRICOR) 145 MG tablet    Sig: Take 1 tablet (145 mg total) by mouth daily.    Dispense:  150 tablet    Refill:  1    Please fill 5 mo supply due to travel out of country  . lisinopril-hydrochlorothiazide (ZESTORETIC) 20-25 MG tablet    Sig: Take 1 tablet by mouth daily.    Dispense:  150 tablet    Refill:  1    Please fill 5 mo supply due to travel out of country  . lovastatin (MEVACOR) 20 MG tablet    Sig: Take 1 tablet nightly for cholesterol    Dispense:  150 tablet    Refill:  1    Please fill 5 mo supply due to travel out of country  . metFORMIN (GLUCOPHAGE-XR) 500 MG 24 hr tablet    Sig: Take 2 tablets (1,000 mg total) by mouth daily with breakfast.    Dispense:  300 tablet    Refill:  1    Please fill 5 mo supply due to travel out of country  . terazosin (HYTRIN) 1 MG capsule    Sig: Take 1 capsule (1 mg total) by mouth at bedtime.    Dispense:  150 capsule    Refill:  1    Please fill 5 mo supply due to travel out of country   Orders Placed This Encounter  Procedures  . Ambulatory referral  to Audiology    Referral Priority:   Routine    Referral Type:   Audiology Exam    Referral Reason:   Specialty Services Required    Number of Visits Requested:   1  . POCT glycosylated hemoglobin (Hb A1C)  . EKG 12-Lead    Patient instructions: Lo remitiremos a Investment banker, operational.  EKG today - overall ok.  Siga esfuerzos para seguir dieta diabetica - baja en azucar y carbohidratos.  Regresar cuando este de vuelta de [place] para examen fisico (medicare wellness visit).   Follow up plan: Return if symptoms worsen or fail to improve.  Ria Bush, MD

## 2019-11-18 NOTE — Assessment & Plan Note (Signed)
Chronic, adequate but not ideal control on metformin XR 1000mg  daily. No changes made today -encouraged renewed efforts at following diabetic diet.

## 2019-11-18 NOTE — Assessment & Plan Note (Signed)
Canals clear. Progressive trouble noted by pt and family. Will refer for formal audiology eval.

## 2019-11-18 NOTE — Patient Instructions (Addendum)
Lo remitiremos a Investment banker, operational.  EKG today - overall ok.  Siga esfuerzos para seguir dieta diabetica - baja en azucar y carbohidratos.  Regresar cuando este de vuelta de [place] para examen fisico (medicare wellness visit).   Diabetes mellitus y nutricin, en adultos Diabetes Mellitus and Nutrition, Adult Si sufre de diabetes (diabetes mellitus), es muy importante tener hbitos alimenticios saludables debido a que sus niveles de Designer, television/film set sangre (glucosa) se ven afectados en gran medida por lo que come y bebe. [place] alimentos saludables en las cantidades [place], aproximadamente a la [employer], [place] ayudar a:  Aeronautical engineer glucemia.  Disminuir el riesgo de sufrir una enfermedad cardaca.  Mejorar la presin arterial.  Science writer o mantener un peso saludable. Todas las personas que sufren de diabetes son diferentes y cada una tiene necesidades diferentes en cuanto a un plan de alimentacin. El mdico puede recomendarle que trabaje con un especialista en dietas y nutricin (nutricionista) para Financial trader plan para usted. Su plan de alimentacin puede variar segn factores como:  Las caloras que necesita.  Los medicamentos que toma.  Su peso.  Sus niveles de glucemia, presin arterial y colesterol.  Su nivel de [place].  Otras afecciones que tenga, como enfermedades cardacas o renales. Cmo me afectan los carbohidratos? Los carbohidratos, o hidratos de carbono, afectan su nivel de glucemia ms que cualquier otro tipo de alimento. La ingesta de carbohidratos naturalmente aumenta la cantidad de [employer]. El recuento de carbohidratos es un mtodo destinado a Catering manager un registro de la cantidad de carbohidratos que se consumen. El recuento de carbohidratos es importante para Theatre manager la glucemia a un nivel saludable, especialmente si utiliza insulina o toma determinados medicamentos por va oral para la diabetes. Es importante conocer la  cantidad de carbohidratos que se pueden ingerir en cada comida sin correr Engineer, manufacturing. Esto es Psychologist, forensic. Su nutricionista puede ayudarlo a calcular la cantidad de carbohidratos que debe ingerir en cada comida y en cada refrigerio. Entre los alimentos que contienen carbohidratos, se incluyen:  Pan, cereal, arroz, pastas y galletas.  Papas y maz.  Guisantes, frijoles y lentejas.  Leche y Estate agent.  Lambert Mody y [place].  Postres, como pasteles, galletas, helado y caramelos. Cmo me afecta el alcohol? El alcohol puede provocar disminuciones sbitas de la glucemia (hipoglucemia), especialmente si utiliza insulina o toma determinados medicamentos por va oral para la diabetes. La hipoglucemia es una afeccin potencialmente mortal. Los sntomas de la hipoglucemia (somnolencia, mareos y confusin) son similares a los sntomas de haber consumido demasiado alcohol. Si el mdico afirma que el alcohol es seguro para usted, [place] estas pautas:  Limite el consumo de alcohol a no ms de 36medida por da si es mujer y no est [place], y a 91medidas si es hombre. Una medida equivale a 12oz (372ml) de cerveza, 5oz (169ml) de vino o 1oz (36ml) de bebidas alcohlicas de alta graduacin.  No beba con el estmago vaco.  Mantngase hidratado bebiendo agua, refrescos dietticos o t helado sin azcar.  Tenga en cuenta que los refrescos comunes, los jugos y otras bebida para Optician, dispensing pueden contener mucha azcar y se deben contar como carbohidratos. Cules son algunos consejos para seguir este plan?  Leer las etiquetas de los alimentos  Comience por leer el tamao de la porcin en la "Informacin nutricional" en las etiquetas de los alimentos envasados y las bebidas. La cantidad de caloras, carbohidratos, grasas y otros nutrientes mencionados en la etiqueta  se basan en una porcin del alimento. Muchos alimentos contienen ms de una porcin por envase.  Verifique la cantidad total de  gramos (g) de carbohidratos totales en una porcin. Puede calcular la cantidad de porciones de carbohidratos al dividir el total de carbohidratos por 15. Por ejemplo, si un alimento tiene un total de 30g de carbohidratos, equivale a 2 porciones de carbohidratos.  Verifique la cantidad de gramos (g) de grasas saturadas y grasas trans en una porcin. Escoja alimentos que no contengan grasa o que tengan un bajo contenido.  Verifique la cantidad de miligramos (mg) de sal (sodio) en una porcin. La [employer] de las personas deben limitar la ingesta de sodio total a menos de 2300mg  por Training and development officer.  Siempre consulte la informacin nutricional de los alimentos etiquetados como "con bajo contenido de grasa" o "sin grasa". Estos alimentos pueden tener un mayor contenido de Location manager agregada o carbohidratos refinados, y deben evitarse.  Hable con su nutricionista para identificar sus objetivos diarios en cuanto a los nutrientes mencionados en la etiqueta. Al ir de compras  Evite comprar alimentos procesados, enlatados o precocinados. Estos alimentos tienden a Special educational needs teacher mayor cantidad de [place], sodio y azcar agregada.  Compre en la zona exterior de la tienda de comestibles. Esta zona incluye frutas y verduras frescas, granos a granel, carnes frescas y productos lcteos frescos. Al cocinar  Utilice mtodos de coccin a baja temperatura, como hornear, en lugar de mtodos de coccin a alta temperatura, como frer en abundante aceite.  Cocine con aceites saludables, como el aceite de [place], canola o [place].  Evite cocinar con manteca, crema o carnes con alto contenido de grasa. Planificacin de las comidas  Coma las comidas y los refrigerios regularmente, preferentemente a la misma hora todos [place]. Evite pasar largos perodos de tiempo sin comer.  Consuma alimentos ricos en fibra, como frutas frescas, verduras, frijoles y cereales integrales. Consulte a su nutricionista sobre cuntas porciones de carbohidratos  puede consumir en cada comida.  Consuma entre 4 y 6 onzas (oz) de protenas magras por da, como carnes [place], pollo, pescado, huevos o tofu. Una onza de protena magra equivale a: ? 1 onza de carne, pollo o pescado. ? 1huevo. ?  taza de tofu.  Coma algunos alimentos por da que contengan grasas saludables, como aguacates, frutos secos, semillas y pescado. Estilo de vida  Controle su nivel de glucemia con regularidad.  Haga actividad fsica habitualmente como se lo haya indicado el mdico. Esto puede incluir lo siguiente: ? 12minutos semanales de ejercicio de intensidad moderada o alta. Esto podra incluir caminatas dinmicas, ciclismo o gimnasia acutica. ? Realizar ejercicios de elongacin y de fortalecimiento, como yoga o levantamiento de pesas, por lo menos 2veces por semana.  Tome los [employer] se lo haya indicado el mdico.  No consuma ningn producto que contenga nicotina o tabaco, como cigarrillos y Psychologist, sport and exercise. Si necesita ayuda para dejar de fumar, consulte al [employer] con un asesor o instructor en diabetes para identificar estrategias para controlar el estrs y cualquier desafo emocional y social. Preguntas para hacerle al mdico  Es necesario que consulte a Radio broadcast assistant en el cuidado de la diabetes?  Es necesario que me rena con un nutricionista?  A qu nmero puedo llamar si tengo preguntas?  Cules son los mejores momentos para controlar la glucemia? Dnde encontrar ms informacin:  Asociacin Estadounidense de la Diabetes (American Diabetes Association): diabetes.org  Academia de Nutricin y Information systems manager (Academy of Nutrition and Dietetics): www.eatright.[place]  Nacional de la Diabetes y las Enfermedades Digestivas y [place] [facility] of Diabetes and Digestive and Kidney Diseases, NIH): [employer]inesFuneral.dk Resumen  Un plan de alimentacin saludable lo ayudar a Aeronautical engineer glucemia y Theatre manager un estilo de  vida saludable.  Trabajar con un especialista en dietas y nutricin (nutricionista) puede ayudarlo a Insurance claims handler de alimentacin para usted.  Tenga en cuenta que los carbohidratos (hidratos de carbono) y el alcohol tienen efectos inmediatos en sus niveles de glucemia. Es importante contar los carbohidratos que ingiere y consumir alcohol con prudencia. Esta informacin no tiene Marine scientist el consejo del mdico. Asegrese de hacerle al mdico cualquier pregunta que tenga. Document Revised: 09/26/2016 Document Reviewed: 05/08/2016 Elsevier Patient Education  [place].

## 2019-11-18 NOTE — Assessment & Plan Note (Addendum)
Lovastatin and fibrate refilled - sent in enough for upcoming 5 month trip out of the country - will see if pharmacy will fill.

## 2019-11-18 NOTE — Assessment & Plan Note (Signed)
zestoretic refilled.

## 2020-03-30 DIAGNOSIS — L819 Disorder of pigmentation, unspecified: Secondary | ICD-10-CM

## 2020-03-30 HISTORY — DX: Disorder of pigmentation, unspecified: L81.9

## 2020-04-19 ENCOUNTER — Other Ambulatory Visit: Payer: Self-pay

## 2020-04-19 ENCOUNTER — Encounter: Payer: Self-pay | Admitting: Family Medicine

## 2020-04-19 ENCOUNTER — Ambulatory Visit (INDEPENDENT_AMBULATORY_CARE_PROVIDER_SITE_OTHER): Payer: Medicare Other | Admitting: Family Medicine

## 2020-04-19 VITALS — BP 140/80 | HR 64 | Temp 96.8°F | Ht 64.5 in | Wt 152.2 lb

## 2020-04-19 DIAGNOSIS — E118 Type 2 diabetes mellitus with unspecified complications: Secondary | ICD-10-CM

## 2020-04-19 DIAGNOSIS — H9193 Unspecified hearing loss, bilateral: Secondary | ICD-10-CM | POA: Diagnosis not present

## 2020-04-19 DIAGNOSIS — IMO0002 Reserved for concepts with insufficient information to code with codable children: Secondary | ICD-10-CM

## 2020-04-19 DIAGNOSIS — E1165 Type 2 diabetes mellitus with hyperglycemia: Secondary | ICD-10-CM

## 2020-04-19 LAB — POCT GLYCOSYLATED HEMOGLOBIN (HGB A1C): Hemoglobin A1C: 8.9 % — AB (ref 4.0–5.6)

## 2020-04-19 MED ORDER — DAPAGLIFLOZIN PROPANEDIOL 5 MG PO TABS: 5.0000 mg | ORAL_TABLET | Freq: Every day | ORAL | 6 refills | Status: DC

## 2020-04-19 NOTE — Patient Instructions (Addendum)
Renueve esfuerzos para seguir dieta baja en azucar y carbohidratos. Aade medicina farxiga (dapagliflozin) para diabetes.  Regresar en 3 meses para proximo fisico.   Diabetes mellitus y nutricin, en adultos Diabetes Mellitus and Nutrition, Adult Si sufre de diabetes, o diabetes mellitus, es muy importante tener hbitos alimenticios saludables debido a que sus niveles de Designer, television/film set sangre (glucosa) se ven afectados en gran medida por lo que come y bebe. [place] alimentos saludables en las cantidades correctas, aproximadamente a la misma hora todos los [place], [place] ayudar a:  Aeronautical engineer glucemia.  Disminuir el riesgo de sufrir una enfermedad cardaca.  Mejorar la presin arterial.  Science writer o mantener un peso saludable. Qu puede afectar mi plan de alimentacin? Todas las personas que sufren de diabetes son diferentes y cada una tiene necesidades diferentes en cuanto a un plan de alimentacin. El mdico puede recomendarle que trabaje con un nutricionista para elaborar el mejor plan para usted. Su plan de alimentacin puede variar segn factores como:  Las caloras que necesita.  Los medicamentos que toma.  Su peso.  Sus niveles de glucemia, presin arterial y colesterol.  Su nivel de [place].  Otras afecciones que tenga, como enfermedades cardacas o renales. Cmo me afectan los carbohidratos? Los carbohidratos, o hidratos de carbono, afectan su nivel de glucemia ms que cualquier otro tipo de alimento. La ingesta de carbohidratos naturalmente aumenta la cantidad de [employer]. El recuento de carbohidratos es un mtodo destinado a Catering manager un registro de la cantidad de carbohidratos que se consumen. El recuento de carbohidratos es importante para Theatre manager la glucemia a un nivel saludable, especialmente si utiliza insulina o toma determinados medicamentos por va oral para la diabetes. Es importante conocer la cantidad de carbohidratos que se pueden ingerir en cada comida sin  correr Engineer, manufacturing. Esto es Psychologist, forensic. Su nutricionista puede ayudarlo a calcular la cantidad de carbohidratos que debe ingerir en cada comida y en cada refrigerio. Cmo me afecta el alcohol? El alcohol puede provocar disminuciones sbitas de la glucemia (hipoglucemia), especialmente si utiliza insulina o toma determinados medicamentos por va oral para la diabetes. La hipoglucemia es una afeccin potencialmente mortal. Los sntomas de la hipoglucemia, como somnolencia, mareos y confusin, son similares a los sntomas de haber consumido demasiado alcohol.  No beba alcohol si: ? Su mdico le indica no hacerlo. ? Est embarazada, puede estar embarazada o est tratando de quedar embarazada.  Si bebe alcohol: ? No beba con el estmago vaco. ? Limite la cantidad que bebe:  De 0 a 1 medida por da para las mujeres.  De 0 a 2 medidas por da para los hombres. ? Est atento a la cantidad de alcohol que hay en las bebidas que toma. En los [place], una medida equivale a una botella de cerveza de 12oz (331ml), un vaso de vino de 5oz (169ml) o un vaso de una bebida alcohlica de alta graduacin de 1oz (65ml). ? Mantngase hidratado bebiendo agua, refrescos dietticos o t helado sin azcar.  Tenga en cuenta que los refrescos comunes, los jugos y otras bebida para Optician, dispensing pueden contener mucha azcar y se deben contar como carbohidratos. Consejos para seguir Photographer las etiquetas de los alimentos  Comience por leer el tamao de la porcin en la "Informacin nutricional" en las etiquetas de los alimentos envasados y las bebidas. La cantidad de caloras, carbohidratos, grasas y otros nutrientes mencionados en la etiqueta se basan en una porcin del alimento. Muchos alimentos contienen  ms de una porcin por envase.  Verifique la cantidad total de gramos (g) de carbohidratos totales en una porcin. Puede calcular la cantidad de porciones de carbohidratos al dividir el  total de carbohidratos por 15. Por ejemplo, si un alimento tiene un total de 30g de carbohidratos totales por porcin, equivale a 2 porciones de carbohidratos.  Verifique la cantidad de gramos (g) de grasas saturadas y grasas trans de una porcin. Escoja alimentos que no contengan estas grasas o que su contenido de estas sea [place].  Verifique la cantidad de miligramos (mg) de sal (sodio) en una porcin. La [employer] de las personas deben limitar la ingesta de sodio total a menos de 2300mg  por Training and development officer.  Siempre consulte la informacin nutricional de los alimentos etiquetados como "con bajo contenido de grasa" o "sin grasa". Estos alimentos pueden tener un mayor contenido de Location manager agregada o carbohidratos refinados, y deben evitarse.  Hable con su nutricionista para identificar sus objetivos diarios en cuanto a los nutrientes mencionados en la etiqueta. Al ir de compras  Evite comprar alimentos procesados, enlatados o precocidos. Estos alimentos tienden a Special educational needs teacher mayor cantidad de [place], sodio y azcar agregada.  Compre en la zona exterior de la tienda de comestibles. Esta es la zona donde se encuentran con mayor frecuencia las frutas y las verduras frescas, los cereales a granel, las carnes frescas y los productos lcteos frescos. Al cocinar  Utilice mtodos de coccin a baja temperatura, como hornear, en lugar de mtodos de coccin a alta temperatura, como frer en abundante aceite.  Cocine con aceites saludables, como el aceite de [place], canola o [place].  Evite cocinar con manteca, crema o carnes con alto contenido de grasa. Planificacin de las comidas  Coma las comidas y los refrigerios regularmente, preferentemente a la misma hora todos [place]. Evite pasar largos perodos de tiempo sin comer.  Consuma alimentos ricos en fibra, como frutas frescas, verduras, frijoles y cereales integrales. Consulte a su nutricionista sobre cuntas porciones de carbohidratos puede consumir en cada  comida.  Consuma entre 4 y 6 onzas (entre 112 y 168g) de protenas magras por da, como carnes magras, pollo, pescado, huevos o tofu. Una onza (oz) de protena magra equivale a: ? 1 onza (28g) de carne, pollo o pescado. ? 1huevo. ?  de taza (62 g) de tofu.  Coma algunos alimentos por da que contengan grasas saludables, como aguacates, frutos secos, semillas y pescado.   Qu alimentos debo comer? Lambert Mody Bayas. Manzanas. Naranjas. Duraznos. Damascos. Ciruelas. Uvas. Mango. Papaya. [place]. Kiwi. Cerezas. [place] Valeda Malm. [place]. Verduras de [employer], que incluyen col rizada, [place], hojas de [place] y de [place]. Remolachas. Coliflor. Repollo. Brcoli. Zanahorias. Judas verdes. Tomates. Pimientos. Cebollas. Pepinos. Coles de Bruselas. Granos Granos integrales, como panes, galletas, tortillas, cereales y pastas de salvado o integrales. Avena sin azcar. Quinua. Arroz integral o salvaje. Carnes y Psychiatric nurse. Carne de ave sin piel. Cortes magros de ave y carne de res. Tofu. Frutos secos. Semillas. Lcteos Productos lcteos sin grasa o con bajo contenido de [place], [place], yogur y [place]. Es posible que los productos que se enumeran ms [place] no constituyan una lista completa de los alimentos y las bebidas que puede tomar. Consulte a un nutricionista para obtener ms informacin. Qu alimentos debo evitar? Lambert Mody Frutas enlatadas al almbar. Verduras Verduras enlatadas. Verduras congeladas con mantequilla o salsa de crema. Granos Productos elaborados con [place] y [place], como panes, pastas, bocadillos y cereales. Evite todos los alimentos procesados. Carnes  y otras protenas Cortes de carne con alto contenido de Lobbyist. Carne de ave con piel. Carnes empanizadas o fritas. Carne procesada. Evite las grasas saturadas. Lcteos Yogur, [place] enteros. Bebidas Bebidas azucaradas, como gaseosas o t helado. Es posible que los productos que se  enumeran ms [place] no constituyan una lista completa de los alimentos y las bebidas que Nurse, adult. Consulte a un nutricionista para obtener ms informacin. Preguntas para hacerle al mdico  Es necesario que me rena con Radio broadcast assistant en el cuidado de la diabetes?  Es necesario que me rena con un nutricionista?  A qu nmero puedo llamar si tengo preguntas?  Cules son los mejores momentos para controlar la glucemia? Dnde encontrar ms informacin:  Asociacin Estadounidense de la Diabetes (American Diabetes Association): diabetes.org  Academy of Nutrition and Dietetics (Academia de Nutricin y Information systems manager): www.eatright.[employer] of Diabetes and Digestive and Kidney Diseases ([place] la Diabetes y [place] y [place]): [employer]MoinesFuneral.dk  Association of Diabetes Care and Education Specialists (Asociacin de Especialistas en Atencin y Educacin sobre la Diabetes): www.diabeteseducator.org Resumen  Es importante tener hbitos alimenticios saludables debido a que sus niveles de Designer, television/film set sangre (glucosa) se ven afectados en gran medida por lo que come y bebe.  Un plan de alimentacin saludable lo ayudar a controlar la glucemia y Theatre manager un estilo de vida saludable.  El mdico puede recomendarle que trabaje con un nutricionista para elaborar el mejor plan para usted.  Tenga en cuenta que los carbohidratos (hidratos de carbono) y el alcohol tienen efectos inmediatos en sus niveles de glucemia. Es importante contar los carbohidratos que ingiere y consumir alcohol con prudencia. Esta informacin no tiene Marine scientist el consejo del mdico. Asegrese de hacerle al mdico cualquier pregunta que tenga. Document Revised: 02/20/2019 Document Reviewed: 02/20/2019 Elsevier Patient Education  2021 [employer].

## 2020-04-19 NOTE — Assessment & Plan Note (Addendum)
Chronic. Deteriorated control after recent trip to [place] with dietary liberties. Recommend renewed efforts towards diabetic diet. Add farxiga 5mg  daily, reviewedh mechanism of action. Monitor for recurrent UTI, yeast infection, groin skin infection. Reassess at 3 mo f/u visit.

## 2020-04-19 NOTE — Assessment & Plan Note (Signed)
Ongoing struggle. Suggested return to ENT/audiology if desires trial of hearing aides.

## 2020-04-19 NOTE — Progress Notes (Signed)
Patient ID: Ricky Garrett, male    DOB: 09/01/1940, 80 y.o.   MRN: [id number]  This visit was conducted in person.  BP 140/80 (BP Location: Left Arm, Patient Position: Sitting, Cuff Size: Normal)   Pulse 64   Temp (!) 96.8 F (36 C) (Temporal)   Ht 5' 4.5" (1.638 m)   Wt 152 lb 3 oz (69 kg)   SpO2 96%   BMI 25.72 kg/m    CC: DM f/u visit  Subjective:   HPI: Ricky Garrett is a 80 y.o. male presenting on 04/19/2020 for Follow-up (DM, HTN, Platelets)   Just came back from trip to [place], [place], [place].  Brings recent labwork which was reviewed.   Recent 24 hour stomach bug with diarrhea and vomiting over weekend, yesterday started feeling better. 7 lb weight loss since last visit.   DM - does regularly check sugars fasting about twice weekly, this morning 135 fasting. Compliant with antihyperglycemic regimen which includes: metformin XR 1040m daily. Denies low sugars or hypoglycemic symptoms. Denies paresthesias. Last diabetic eye exam 08/2019. Pneumovax: 2017. Prevnar: 2020. Glucometer brand: accuchek. DSME: completed 07/2019.  Lab Results  Component Value Date   HGBA1C 8.9 (A) 04/19/2020   Diabetic Foot Exam - Simple   Simple Foot Form Diabetic Foot exam was performed with the following findings: Yes 04/19/2020 10:33 AM  Visual Inspection No deformities, no ulcerations, no other skin breakdown bilaterally: Yes Sensation Testing Intact to touch and monofilament testing bilaterally: Yes Pulse Check Posterior Tibialis and Dorsalis pulse intact bilaterally: Yes Comments    No results found for: MDerl Barrow  [facility]. Had virtual colonoscopy showing no signs of polyp, as well as 2.4cm hypodensity to R kidney likely cyst consider renal [place]      Relevant past medical, surgical, family and social history reviewed and updated as indicated. Interim medical history since our last visit  reviewed. Allergies and medications reviewed and updated. Outpatient Medications Prior to Visit  Medication Sig Dispense Refill  . Accu-Chek Softclix Lancets lancets 1 EACH BY OTHER ROUTE AS DIRECTED. USE AS INSTRUCTED TO CHECK BLOOD SUGAR. 100 each 3  . aspirin EC 81 MG tablet Take 1 tablet (81 mg total) by mouth every Monday, Wednesday, and Friday.    . Blood Glucose Monitoring Suppl (ACCU-CHEK AVIVA PLUS) w/Device KIT Use as directed to check sugars daily and as needed E11.8 1 kit 0  . famotidine (PEPCID) 20 MG tablet Take 1 tablet (20 mg total) by mouth daily. 150 tablet 1  . fenofibrate (TRICOR) 145 MG tablet Take 1 tablet (145 mg total) by mouth daily. 150 tablet 1  . glucose blood (ACCU-CHEK AVIVA PLUS) test strip Use as instructed to check blood sugar once daily 100 strip 3  . lisinopril-hydrochlorothiazide (ZESTORETIC) 20-25 MG tablet Take 1 tablet by mouth daily. 150 tablet 1  . lovastatin (MEVACOR) 20 MG tablet Take 1 tablet nightly for cholesterol 150 tablet 1  . metFORMIN (GLUCOPHAGE-XR) 500 MG 24 hr tablet Take 2 tablets (1,000 mg total) by mouth daily with breakfast. 300 tablet 1  . Multiple Vitamins-Minerals (CENTRUM SILVER) tablet Take 1 tablet by mouth daily.    .Marland Kitchenterazosin (HYTRIN) 1 MG capsule Take 1 capsule (1 mg total) by mouth at bedtime. 150 capsule 1   No facility-administered medications prior to visit.     Per HPI unless specifically indicated in ROS section below Review of Systems Objective:  BP  140/80 (BP Location: Left Arm, Patient Position: Sitting, Cuff Size: Normal)   Pulse 64   Temp (!) 96.8 F (36 C) (Temporal)   Ht 5' 4.5" (1.638 m)   Wt 152 lb 3 oz (69 kg)   SpO2 96%   BMI 25.72 kg/m   Wt Readings from Last 3 Encounters:  04/19/20 152 lb 3 oz (69 kg)  11/18/19 159 lb (72.1 kg)  06/24/19 158 lb 2 oz (71.7 kg)      Physical Exam Vitals and nursing note reviewed.  Constitutional:      General: He is not in acute distress.    Appearance:  Normal appearance. He is well-developed. He is not ill-appearing.  Eyes:     General: No scleral icterus.    Extraocular Movements: Extraocular movements intact.     Conjunctiva/sclera: Conjunctivae normal.     Pupils: Pupils are equal, round, and reactive to light.  Cardiovascular:     Rate and Rhythm: Normal rate and regular rhythm.     Pulses: Normal pulses.     Heart sounds: Normal heart sounds. No murmur heard.   Pulmonary:     Effort: Pulmonary effort is normal. No respiratory distress.     Breath sounds: Normal breath sounds. No wheezing, rhonchi or rales.  Musculoskeletal:     Cervical back: Normal range of motion and neck supple.     Right lower leg: No edema.     Left lower leg: No edema.     Comments: See HPI for foot exam if done  Lymphadenopathy:     Cervical: No cervical adenopathy.  Skin:    General: Skin is warm and dry.     Findings: No rash.  Psychiatric:        Mood and Affect: Mood normal.       Results for orders placed or performed in visit on 04/19/20  POCT HgB A1C  Result Value Ref Range   Hemoglobin A1C 8.9 (A) 4.0 - 5.6 %   HbA1c POC (<> result, manual entry)     HbA1c, POC (prediabetic range)     HbA1c, POC (controlled diabetic range)     Assessment & Plan:  This visit occurred during the SARS-CoV-2 public health emergency.  Safety protocols were in place, including screening questions prior to the visit, additional usage of staff PPE, and extensive cleaning of exam room while observing appropriate contact time as indicated for disinfecting solutions.   Problem List Items Addressed This Visit    DM (diabetes mellitus), type 2, uncontrolled with complications ([place]) - Primary    Chronic. Deteriorated control after recent trip to [place] with dietary liberties. Recommend renewed efforts towards diabetic diet. Add farxiga 70m daily, reviewedh mechanism of action. Monitor for recurrent UTI, yeast infection, groin skin infection. Reassess at 3 mo f/u  visit.       Relevant Medications   dapagliflozin propanediol (FARXIGA) 5 MG TABS tablet   Other Relevant Orders   POCT HgB A1C (Completed)   Decreased hearing of both ears    Ongoing struggle. Suggested return to ENT/audiology if desires trial of hearing aides.           Meds ordered this encounter  Medications  . dapagliflozin propanediol (FARXIGA) 5 MG TABS tablet    Sig: Take 1 tablet (5 mg total) by mouth daily before breakfast.    Dispense:  30 tablet    Refill:  6   Orders Placed This Encounter  Procedures  . POCT HgB A1C  Patient instructions: Renueve esfuerzos para seguir dieta baja en azucar y carbohidratos. Aade medicina farxiga (dapagliflozin) para diabetes.  Regresar en 3 meses para proximo fisico.   Follow up plan: Return in about 3 months (around 07/20/2020) for annual exam, prior fasting for blood work, medicare wellness visit.  Ria Bush, MD

## 2020-04-22 DIAGNOSIS — D1801 Hemangioma of skin and subcutaneous tissue: Secondary | ICD-10-CM | POA: Diagnosis not present

## 2020-04-22 DIAGNOSIS — L814 Other melanin hyperpigmentation: Secondary | ICD-10-CM | POA: Diagnosis not present

## 2020-04-22 DIAGNOSIS — Z85828 Personal history of other malignant neoplasm of skin: Secondary | ICD-10-CM | POA: Diagnosis not present

## 2020-04-22 DIAGNOSIS — D2371 Other benign neoplasm of skin of right lower limb, including hip: Secondary | ICD-10-CM | POA: Diagnosis not present

## 2020-04-22 DIAGNOSIS — L821 Other seborrheic keratosis: Secondary | ICD-10-CM | POA: Diagnosis not present

## 2020-04-22 DIAGNOSIS — D225 Melanocytic nevi of trunk: Secondary | ICD-10-CM | POA: Diagnosis not present

## 2020-04-22 DIAGNOSIS — L570 Actinic keratosis: Secondary | ICD-10-CM | POA: Diagnosis not present

## 2020-04-22 DIAGNOSIS — D485 Neoplasm of uncertain behavior of skin: Secondary | ICD-10-CM | POA: Diagnosis not present

## 2020-04-22 DIAGNOSIS — L430 Hypertrophic lichen planus: Secondary | ICD-10-CM | POA: Diagnosis not present

## 2020-05-05 ENCOUNTER — Encounter: Payer: Self-pay | Admitting: Family Medicine

## 2020-05-06 ENCOUNTER — Encounter: Payer: Self-pay | Admitting: Family Medicine

## 2020-05-11 ENCOUNTER — Other Ambulatory Visit: Payer: Self-pay | Admitting: Family Medicine

## 2020-05-11 DIAGNOSIS — E1159 Type 2 diabetes mellitus with other circulatory complications: Secondary | ICD-10-CM

## 2020-05-19 ENCOUNTER — Other Ambulatory Visit: Payer: Medicare Other

## 2020-06-16 DIAGNOSIS — H905 Unspecified sensorineural hearing loss: Secondary | ICD-10-CM | POA: Diagnosis not present

## 2020-07-15 ENCOUNTER — Other Ambulatory Visit: Payer: Self-pay

## 2020-07-15 ENCOUNTER — Ambulatory Visit (INDEPENDENT_AMBULATORY_CARE_PROVIDER_SITE_OTHER): Payer: Medicare Other

## 2020-07-15 DIAGNOSIS — Z0000 Encounter for general adult medical examination without abnormal findings: Secondary | ICD-10-CM | POA: Diagnosis not present

## 2020-07-15 NOTE — Progress Notes (Signed)
PCP notes:  Health Maintenance: Covid booster- due    Abnormal Screenings: none   Patient concerns: none   Nurse concerns: none   Next PCP appt.: 08/25/2020 @ 2 pm

## 2020-07-15 NOTE — Progress Notes (Signed)
Subjective:   Ricky Garrett is a 80 y.o. male who presents for Medicare Annual/Subsequent preventive examination.  Review of Systems: N/A      I connected with the patient today by telephone and verified that I am speaking with the correct person using two identifiers. Location patient: home Location nurse: work Persons participating in the telephone visit: patient, Maren Reamer (daughter) (HIPAA verified) and nurse.   I discussed the limitations, risks, security and privacy concerns of performing an evaluation and management service by telephone and the availability of in person appointments. I also discussed with the patient that there may be a patient responsible charge related to this service. The patient expressed understanding and verbally consented to this telephonic visit.              Objective:    Today's Vitals   There is no height or weight on file to calculate BMI.  Advanced Directives 07/15/2020 07/04/2016 07/02/2015 05/14/2015 01/28/2015 09/21/2014 01/29/2014  Does Patient Have a Medical Advance Directive? No No No Yes No No No  Type of Advance Directive - - - Living will;Healthcare Power of Attorney - - -  Does patient want to make changes to medical advance directive? - - - No - Patient declined - - -  Copy of [place] in Chart? - - - No - copy requested - - -  Would patient like information on creating a medical advance directive? No - Patient declined No - Patient declined No - patient declined information - No - patient declined information No - patient declined information Yes - Educational materials given    Current Medications (verified) Outpatient Encounter Medications as of 07/15/2020  Medication Sig   Accu-Chek Softclix Lancets lancets 1 EACH BY OTHER ROUTE AS DIRECTED. USE AS INSTRUCTED TO CHECK BLOOD SUGAR.   aspirin EC 81 MG tablet Take 1 tablet (81 mg total) by mouth every Monday, Wednesday, and Friday.   Blood Glucose Monitoring Suppl  (ACCU-CHEK AVIVA PLUS) w/Device KIT Use as directed to check sugars daily and as needed E11.8   famotidine (PEPCID) 20 MG tablet Take 1 tablet (20 mg total) by mouth daily.   fenofibrate (TRICOR) 145 MG tablet Take 1 tablet (145 mg total) by mouth daily.   glucose blood (ACCU-CHEK AVIVA PLUS) test strip Use as instructed to check blood sugar once daily   lisinopril-hydrochlorothiazide (ZESTORETIC) 20-25 MG tablet Take 1 tablet by mouth daily.   lovastatin (MEVACOR) 20 MG tablet Take 1 tablet nightly for cholesterol   metFORMIN (GLUCOPHAGE-XR) 500 MG 24 hr tablet Take 2 tablets (1,000 mg total) by mouth daily with breakfast.   Multiple Vitamins-Minerals (CENTRUM SILVER) tablet Take 1 tablet by mouth daily.   terazosin (HYTRIN) 1 MG capsule TAKE 1 CAPSULE BY MOUTH AT BEDTIME.   dapagliflozin propanediol (FARXIGA) 5 MG TABS tablet Take 1 tablet (5 mg total) by mouth daily before breakfast. (Patient not taking: Reported on 07/15/2020)   No facility-administered encounter medications on file as of 07/15/2020.    Allergies (verified) Patient has no known allergies.   History: Past Medical History:  Diagnosis Date   Cancer of skin of left ear 2019   seen by a dermatologist in [facility]   Decreased hearing    pt declines   Diabetes mellitus without complication ([place])    GERD (gastroesophageal reflux disease)    History of stomach ulcers    Hyperlipidemia    Hypertension    Pigmented skin lesion 03/2020   R  occipital scalp - pigmented lichen planus-like keratosis ([place])   Rhinitis, allergic    Thrombocytopenia ([place])    Past Surgical History:  Procedure Laterality Date   COLONOSCOPY  2011   1 polyp (outlaw)   COLONOSCOPY  01/2014   diverticulosis, 2 polyps, hemorrhoids, rpt 5 yrs (Outlaw)   NASAL SEPTUM SURGERY  1990   Family History  Problem Relation Age of Onset   CAD Neg Hx    Stroke Neg Hx    Diabetes Neg Hx    Cancer Neg Hx    Social History   Socioeconomic History    Marital status: Married    Spouse name: Tamera Stands de [place]   Number of children: Not on file   Years of education: Not on file   Highest education level: Not on file  Occupational History    Employer: [employer]  Tobacco Use   Smoking status: Former    Packs/day: 0.50    Years: 1.00    Pack years: 0.50    Types: Cigarettes    Quit date: 1990    Years since quitting: 32.4   Smokeless tobacco: Never  Substance and Sexual Activity   Alcohol use: Not Currently    Alcohol/week: 2.0 standard drinks    Types: 1 Glasses of wine, 1 Cans of beer per week   Drug use: No   Sexual activity: Yes    Partners: Female  Other Topics Concern   Not on file  Social History Narrative   Lives with daughter [name]) and her husband, 2 grand children, and wife    Moved from [place] to the [place] (2008).    Was living in [place] and moved to [place] first and then to [facility] (2008).    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 Cares for grandchildren.    Edu - 5th grade    Living in [place] with wife and daughter's family.    Activity: walking regularly   Diet: good water, fruits/vegetables daily    Social Determinants of Health   Financial Resource Strain: Low Risk    Difficulty of Paying Living Expenses: Not hard at all  Food Insecurity: No Food Insecurity   Worried About Charity fundraiser in the Last Year: Never true   Ran Out of Food in the Last Year: Never true  Transportation Needs: No Transportation Needs   Lack of Transportation (Medical): No   Lack of Transportation (Non-Medical): No  Physical Activity: Insufficiently Active   Days of Exercise per Week: 2 days   Minutes of Exercise per Session: 30 min  Stress: No Stress Concern Present   Feeling of Stress : Not at all  Social Connections: Not on file    Tobacco Counseling Counseling given: Not Answered   Clinical Intake:  Pre-visit preparation completed: Yes  Pain : No/denies pain     Nutritional  Risks: None Diabetes: Yes CBG done?: No Did pt. bring in CBG monitor from home?: No  How often do you need to have someone help you when you read instructions, pamphlets, or other written materials from your doctor or pharmacy?: 1 - Never  Diabetic: Yes Nutrition Risk Assessment:  Has the patient had any N/V/D within the last 2 months?  No  Does the patient have any non-healing wounds?  No  Has the patient had any unintentional weight loss or weight gain?  No   Diabetes:  Is the patient diabetic?  Yes  If diabetic, was a CBG obtained today?  No  telephone visit  Did the patient bring in their glucometer from home?  No  telephone visit  How often do you monitor your CBG's? sometimes.   Financial Strains and Diabetes Management:  Are you having any financial strains with the device, your supplies or your medication? No .  Does the patient want to be seen by Chronic Care Management for management of their diabetes?  No  Would the patient like to be referred to a Nutritionist or for Diabetic Management?  No   Diabetic Exams:  Diabetic Eye Exam: Completed 09/23/2019 Diabetic Foot Exam: Completed 04/19/2020   Interpreter Needed?: No  Information entered by :: CJohnson, LPN   Activities of Daily Living In your present state of health, do you have any difficulty performing the following activities: 07/15/2020  Hearing? Y  Comment wears hearing aids  Vision? N  Difficulty concentrating or making decisions? N  Walking or climbing stairs? N  Dressing or bathing? N  Doing errands, shopping? N  Preparing Food and eating ? N  Using the Toilet? N  In the past six months, have you accidently leaked urine? N  Do you have problems with loss of bowel control? N  Managing your Medications? N  Managing your Finances? N  Housekeeping or managing your Housekeeping? N  Some recent data might be hidden    Patient Care Team: Ria Bush, MD as PCP - General (Family  Medicine) Heath Lark, MD as Consulting Physician (Hematology and Oncology)  Indicate any recent Medical Services you may have received from other than [facility] providers in the past year (date may be approximate).     Assessment:   This is a routine wellness examination for [place].  Hearing/Vision screen Vision Screening - Comments:: Patient gets annual eye exams   Dietary issues and exercise activities discussed: Current Exercise Habits: Home exercise routine, Type of exercise: walking, Time (Minutes): 30, Frequency (Times/Week): 2, Weekly Exercise (Minutes/Week): 60, Exercise limited by: None identified   Goals Addressed             This Visit's Progress    Patient Stated       07/15/2020, I will continue to walk 2 days a week for 30 minutes.         Depression Screen PHQ 2/9 Scores 07/15/2020 12/23/2018 10/31/2017 02/23/2017 07/04/2016 07/02/2015 03/04/2015  PHQ - 2 Score 0 0 0 0 0 0 0  PHQ- 9 Score 0 - - - - - -    Fall Risk Fall Risk  07/15/2020 12/23/2018 12/20/2018 10/31/2017 02/23/2017  Falls in the past year? 0 0 0 No No  Comment - - Emmi Telephone Survey: data to providers prior to load - -  Number falls in past yr: 0 - - - -  Injury with Fall? 0 - - - -  Risk for fall due to : Medication side effect - - - -  Follow up Falls evaluation completed;Falls prevention discussed - - - -    FALL RISK PREVENTION PERTAINING TO THE HOME:  Any stairs in or around the home? Yes  If so, are there any without handrails? No  Home free of loose throw rugs in walkways, pet beds, electrical cords, etc? Yes  Adequate lighting in your home to reduce risk of falls? Yes   ASSISTIVE DEVICES UTILIZED TO PREVENT FALLS:  Life alert? No  Use of a cane, walker or w/c? No  Grab bars in the bathroom? No  Shower chair or bench in shower? No  Elevated toilet seat or a handicapped toilet? No   TIMED UP AND GO:  Was the test performed?  N/A telephone visit .    Cognitive Function: MMSE - Mini  Mental State Exam 07/15/2020  Not completed: Unable to complete       Mini Cog  Mini-Cog screen was not completed. Language barrier. Maximum score is 22. A value of 0 denotes this part of the MMSE was not completed or the patient failed this part of the Mini-Cog screening.  Immunizations Immunization History  Administered Date(s) Administered   Influenza Inj Mdck Quad Pf 10/30/2017   Influenza Split 11/14/2010   Influenza Whole 11/29/2007, 11/09/2008, 11/04/2010   Influenza, High Dose Seasonal PF 10/20/2014, 11/09/2015, 10/24/2018, 10/23/2019   Influenza,inj,Quad PF,6+ Mos 09/24/2012   Influenza-Unspecified 10/30/2013, 02/27/2016, 10/10/2016   PFIZER(Purple Top)SARS-COV-2 Vaccination 02/13/2019, 03/06/2019, 11/12/2019   Pneumococcal Conjugate-13 08/30/2018   Pneumococcal Polysaccharide-23 10/14/2009, 01/05/2016   Td 03/30/2005   Tdap 01/05/2016   Zoster Recombinat (Shingrix) 01/09/2019, 04/04/2019   Zoster, Live 02/23/2017    TDAP status: Up to date  Flu Vaccine status: Up to date  Pneumococcal vaccine status: Up to date  Covid-19 vaccine status: Completed vaccines  Qualifies for Shingles Vaccine? Yes   Zostavax completed Yes   Shingrix Completed?: Yes  Screening Tests Health Maintenance  Topic Date Due   COVID-19 Vaccine (4 - Booster for Pfizer series) 02/12/2020   INFLUENZA VACCINE  08/30/2020   OPHTHALMOLOGY EXAM  09/22/2020   HEMOGLOBIN A1C  10/20/2020   FOOT EXAM  04/19/2021   COLONOSCOPY (Pts 45-47yr Insurance coverage will need to be confirmed)  08/21/2022   TETANUS/TDAP  01/04/2026   PNA vac Low Risk Adult  Completed   Zoster Vaccines- Shingrix  Completed   HPV VACCINES  Aged Out    Health Maintenance  Health Maintenance Due  Topic Date Due   COVID-19 Vaccine (4 - [facility]oster for [facility]) 02/12/2020    Colorectal cancer screening: No longer required.   Lung Cancer Screening: (Low Dose CT Chest recommended if Age 80-80years, 30 pack-year  currently smoking OR have quit w/in 15years.) does not qualify.   Additional Screening:  Hepatitis C Screening: does not qualify; Completed N/A  Vision Screening: Recommended annual ophthalmology exams for early detection of glaucoma and other disorders of the eye. Is the patient up to date with their annual eye exam?  Yes  Who is the provider or what is the name of the office in which the patient attends annual eye exams? Dr. GKaty FitchIf pt is not established with a provider, would they like to be referred to a provider to establish care? No .   Dental Screening: Recommended annual dental exams for proper oral hygiene  Community Resource Referral / Chronic Care Management: CRR required this visit?  No   CCM required this visit?  No      Plan:     I have personally reviewed and noted the following in the patient's chart:   Medical and social history Use of alcohol, tobacco or illicit drugs  Current medications and supplements including opioid prescriptions. Patient is not currently taking opioid prescriptions. Functional ability and status Nutritional status Physical activity Advanced directives List of other physicians Hospitalizations, surgeries, and ER visits in previous 12 months Vitals Screenings to include cognitive, depression, and falls Referrals and appointments  In addition, I have reviewed and discussed with patient certain preventive protocols, quality metrics, and best practice recommendations. A written personalized care plan for preventive services  as well as general preventive health recommendations were provided to patient.   Due to this being a telephonic visit, the after visit summary with patients personalized plan was offered to patient via office or my-chart. Patient preferred to pick up at office at next visit or via mychart.   Andrez Grime, LPN   [id number]

## 2020-07-15 NOTE — Patient Instructions (Signed)
Ricky Garrett , Thank you for taking time to come for your Medicare Wellness Visit. I appreciate your ongoing commitment to your health goals. Please review the following plan we discussed and let me know if I can assist you in the future.   Screening recommendations/referrals: Colonoscopy: no longer required  Recommended yearly ophthalmology/optometry visit for glaucoma screening and checkup Recommended yearly dental visit for hygiene and checkup  Vaccinations: Influenza vaccine: Up to date, completed 10/23/2019, due 08/2020 Pneumococcal vaccine: Completed series Tdap vaccine: Up to date, completed 01/05/2016, due 12/2025 Shingles vaccine: Completed series   Covid-19: completed 3 vaccines, booster discussed  Advanced directives: Advance directive discussed with you today. Even though you declined this today please call our office should you change your mind and we can give you the proper paperwork for you to fill out.  Conditions/risks identified: diabetes, hypertension, hyperlipidemia   Next appointment: Follow up in one year for your annual wellness visit.   Preventive Care 66 Years and Older, Male Preventive care refers to lifestyle choices and visits with your health care provider that can promote health and wellness. What does preventive care include? A yearly physical exam. This is also called an annual well check. Dental exams once or twice a year. Routine eye exams. Ask your health care provider how often you should have your eyes checked. Personal lifestyle choices, including: Daily care of your teeth and gums. Regular physical activity. Eating a healthy diet. Avoiding tobacco and drug use. Limiting alcohol use. Practicing safe sex. Taking low doses of aspirin every day. Taking vitamin and mineral supplements as recommended by your health care provider. What happens during an annual well check? The services and screenings done by your health care provider during your annual  well check will depend on your age, overall health, lifestyle risk factors, and family history of disease. Counseling  Your health care provider may ask you questions about your: Alcohol use. Tobacco use. Drug use. Emotional well-being. Home and relationship well-being. Sexual activity. Eating habits. History of falls. Memory and ability to understand (cognition). Work and work Statistician. Screening  You may have the following tests or measurements: Height, weight, and BMI. Blood pressure. Lipid and cholesterol levels. These may be checked every 5 years, or more frequently if you are over 57 years old. Skin check. Lung cancer screening. You may have this screening every year starting at age 77 if you have a 30-pack-year history of smoking and currently smoke or have quit within the past 15 years. Fecal occult blood test (FOBT) of the stool. You may have this test every year starting at age 42. Flexible sigmoidoscopy or colonoscopy. You may have a sigmoidoscopy every 5 years or a colonoscopy every 10 years starting at age 35. Prostate cancer screening. Recommendations will vary depending on your family history and other risks. Hepatitis C blood test. Hepatitis B blood test. Sexually transmitted disease (STD) testing. Diabetes screening. This is done by checking your blood sugar (glucose) after you have not eaten for a while (fasting). You may have this done every 1-3 years. Abdominal aortic aneurysm (AAA) screening. You may need this if you are a current or former smoker. Osteoporosis. You may be screened starting at age 83 if you are at high risk. Talk with your health care provider about your test results, treatment options, and if necessary, the need for more tests. Vaccines  Your health care provider may recommend certain vaccines, such as: Influenza vaccine. This is recommended every year. Tetanus, diphtheria, and acellular  pertussis (Tdap, Td) vaccine. You may need a Td booster  every 10 years. Zoster vaccine. You may need this after age 73. Pneumococcal 13-valent conjugate (PCV13) vaccine. One dose is recommended after age 42. Pneumococcal polysaccharide (PPSV23) vaccine. One dose is recommended after age 32. Talk to your health care provider about which screenings and vaccines you need and how often you need them. This information is not intended to replace advice given to you by your health care provider. Make sure you discuss any questions you have with your health care provider. Document Released: 02/12/2015 Document Revised: 10/06/2015 Document Reviewed: 11/17/2014 Elsevier Interactive Patient Education  2017 [employer] Prevention in the Home Falls can cause injuries. They can happen to people of all ages. There are many things you can do to make your home safe and to help prevent falls. What can I do on the outside of my home? Regularly fix the edges of walkways and driveways and fix any cracks. Remove anything that might make you trip as you walk through a door, such as a raised step or threshold. Trim any bushes or trees on the path to your home. Use bright outdoor lighting. Clear any walking paths of anything that might make someone trip, such as rocks or tools. Regularly check to see if handrails are loose or broken. Make sure that both sides of any steps have handrails. Any raised decks and porches should have guardrails on the edges. Have any leaves, snow, or ice cleared regularly. Use sand or salt on walking paths during winter. Clean up any spills in your garage right away. This includes oil or grease spills. What can I do in the bathroom? Use night lights. Install grab bars by the toilet and in the tub and shower. Do not use towel bars as grab bars. Use non-skid mats or decals in the tub or shower. If you need to sit down in the shower, use a plastic, non-slip stool. Keep the floor dry. Clean up any water that spills on the floor as soon  as it happens. Remove soap buildup in the tub or shower regularly. Attach bath mats securely with double-sided non-slip rug tape. Do not have throw rugs and other things on the floor that can make you trip. What can I do in the bedroom? Use night lights. Make sure that you have a light by your bed that is easy to reach. Do not use any sheets or blankets that are too big for your bed. They should not hang down onto the floor. Have a firm chair that has side arms. You can use this for support while you get dressed. Do not have throw rugs and other things on the floor that can make you trip. What can I do in the kitchen? Clean up any spills right away. Avoid walking on wet floors. Keep items that you use a lot in easy-to-reach places. If you need to reach something above you, use a strong step stool that has a grab bar. Keep electrical cords out of the way. Do not use floor polish or wax that makes floors slippery. If you must use wax, use non-skid floor wax. Do not have throw rugs and other things on the floor that can make you trip. What can I do with my stairs? Do not leave any items on the stairs. Make sure that there are handrails on both sides of the stairs and use them. Fix handrails that are broken or loose. Make sure that handrails  are as long as the stairways. Check any carpeting to make sure that it is firmly attached to the stairs. Fix any carpet that is loose or worn. Avoid having throw rugs at the top or bottom of the stairs. If you do have throw rugs, attach them to the floor with carpet tape. Make sure that you have a light switch at the top of the stairs and the bottom of the stairs. If you do not have them, ask someone to add them for you. What else can I do to help prevent falls? Wear shoes that: Do not have high heels. Have rubber bottoms. Are comfortable and fit you well. Are closed at the toe. Do not wear sandals. If you use a stepladder: Make sure that it is fully  opened. Do not climb a closed stepladder. Make sure that both sides of the stepladder are locked into place. Ask someone to hold it for you, if possible. Clearly mark and make sure that you can see: Any grab bars or handrails. First and last steps. Where the edge of each step is. Use tools that help you move around (mobility aids) if they are needed. These include: Canes. Walkers. Scooters. Crutches. Turn on the lights when you go into a dark area. Replace any light bulbs as soon as they burn out. Set up your furniture so you have a clear path. Avoid moving your furniture around. If any of your floors are uneven, fix them. If there are any pets around you, be aware of where they are. Review your medicines with your doctor. Some medicines can make you feel dizzy. This can increase your chance of falling. Ask your doctor what other things that you can do to help prevent falls. This information is not intended to replace advice given to you by your health care provider. Make sure you discuss any questions you have with your health care provider. Document Released: 11/12/2008 Document Revised: 06/24/2015 Document Reviewed: 02/20/2014 Elsevier Interactive Patient Education  2017 [employer].

## 2020-08-05 IMAGING — CT CT VIRTUAL COLONOSCOPY SCREENING
3 of 9 series · 16 of 46 positions shown, 17 images · non-contrast
Comparison: None

CLINICAL DATA: Pseudo polyposis follow-up

EXAM:
CT VIRTUAL COLONOSCOPY SCREENING
TECHNIQUE: The patient was given a standard bowel preparation with Gastrografin
and barium for fluid and stool tagging respectively. The quality of
the bowel preparation is poor. Automated CO2 insufflation of the
colon was performed prior to image acquisition and colonic
distention is good. Image post processing was used to generate a 3D
endoluminal fly-through projection of the colon and to
electronically subtract stool/fluid as appropriate.

[Series 5: supine colon 1.50 br40 s3 supine thins · axial · 0.66mm/px · z∈[+1404,+1651]mm · 5 of 331 slices shown]
[im 34/331  soft-tissue]
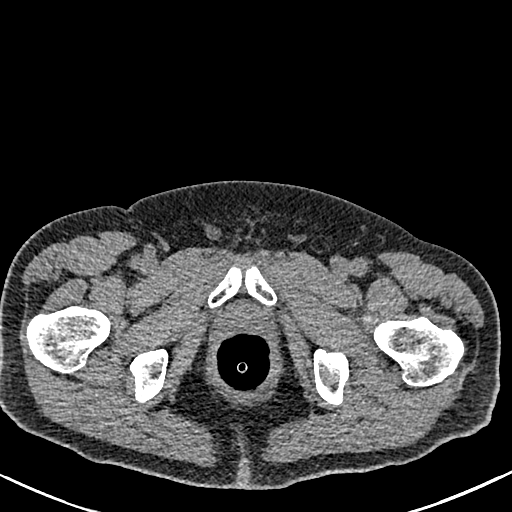
[im 67/331  soft-tissue]
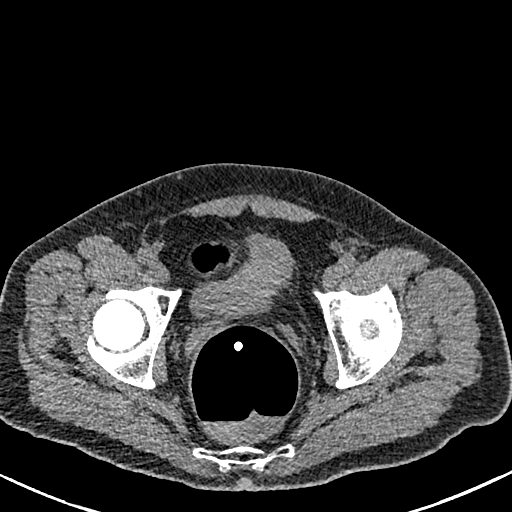
[im 100/331  soft-tissue]
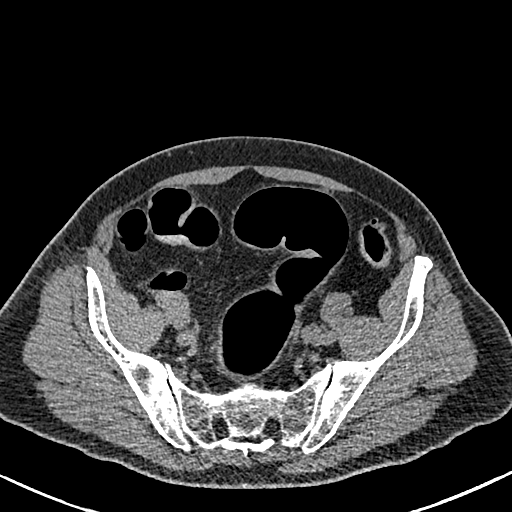
[im 133/331  soft-tissue]
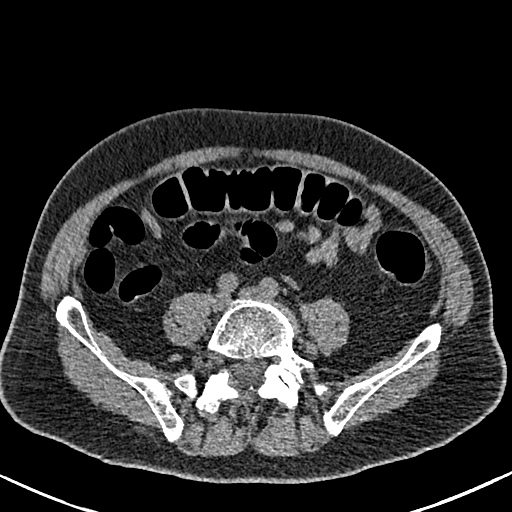
[im 199/331  soft-tissue]
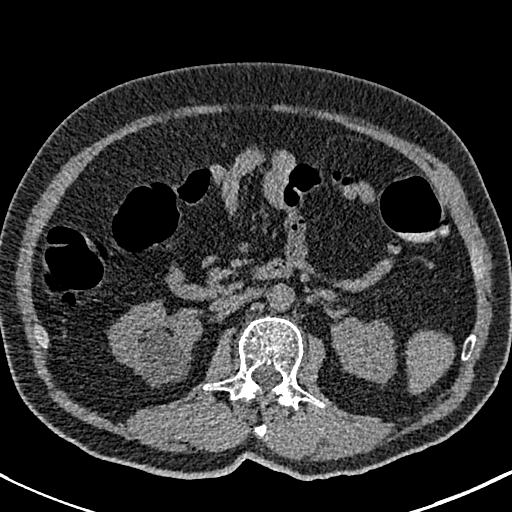

[Series 7: supine colon 3.00 br40 s3 cor supine · coronal · 0.67mm/px · 3 of 97 slices shown]
[im 25/97  soft-tissue]
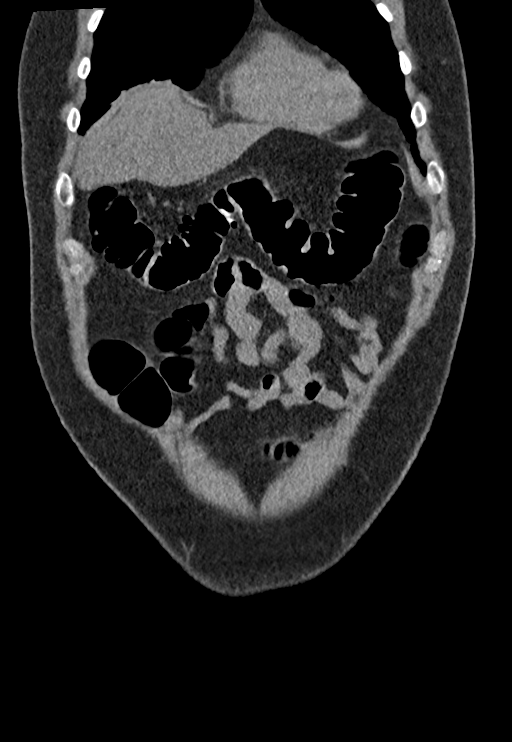
[im 49/97  soft-tissue]
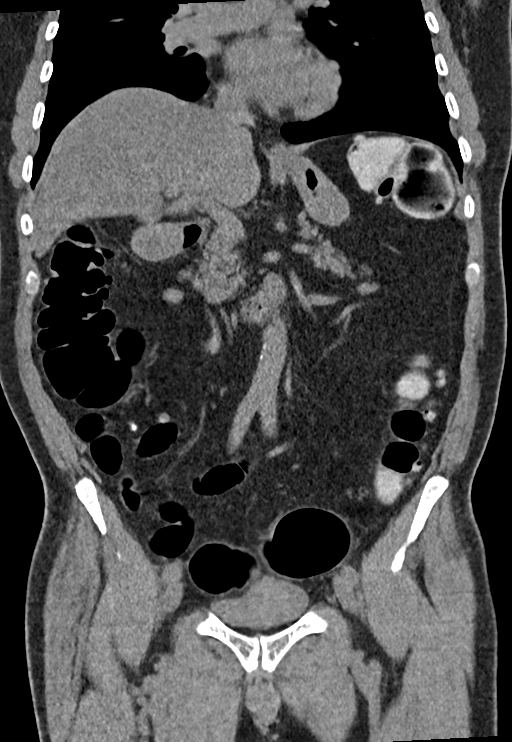
[im 73/97  soft-tissue]
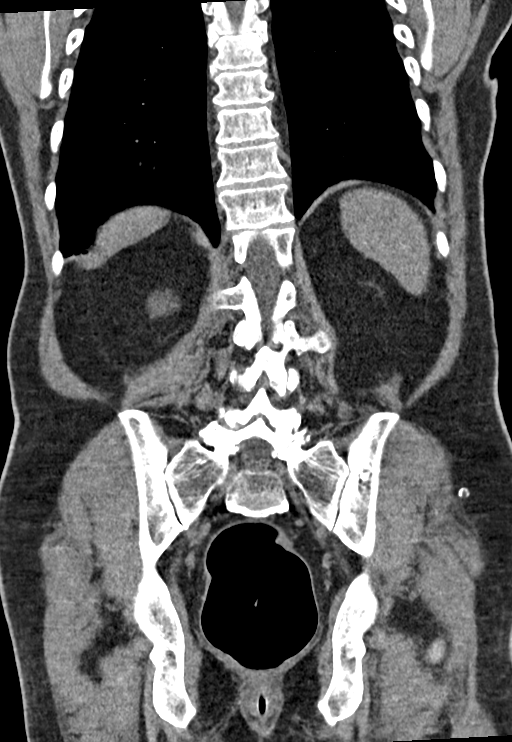

[Series 12: prone colon 1.50 br40 s3 prone thin · axial · 0.69mm/px · z∈[+1441,+1830]mm · 8 of 335 slices shown, 9 images]
[im 38/335  soft-tissue]
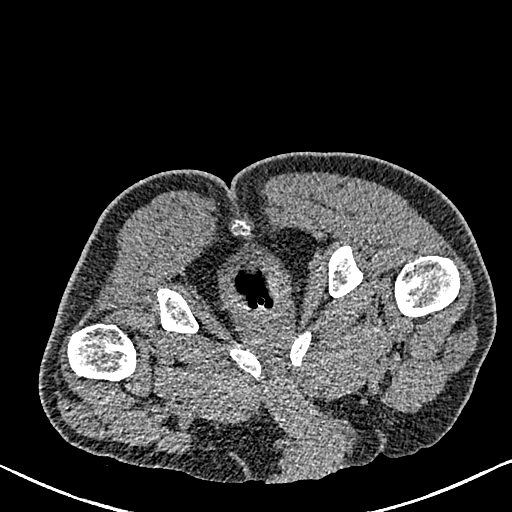
[im 38/335  bone]
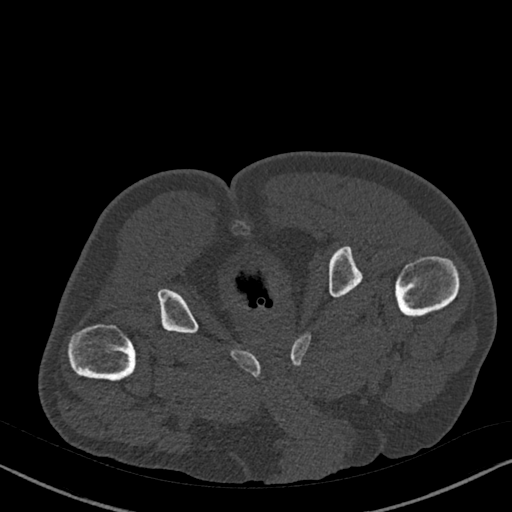
[im 75/335  soft-tissue]
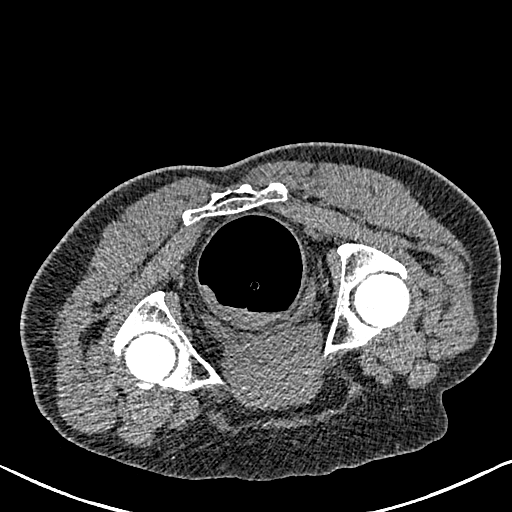
[im 112/335  soft-tissue]
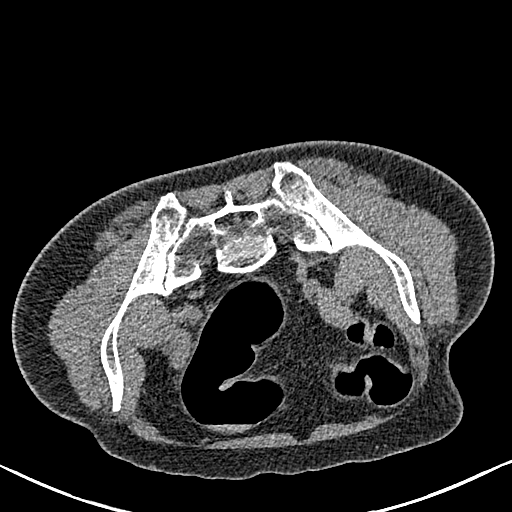
[im 149/335  soft-tissue]
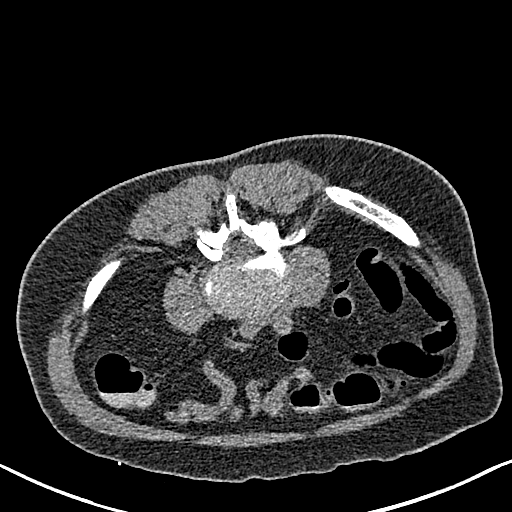
[im 186/335  soft-tissue]
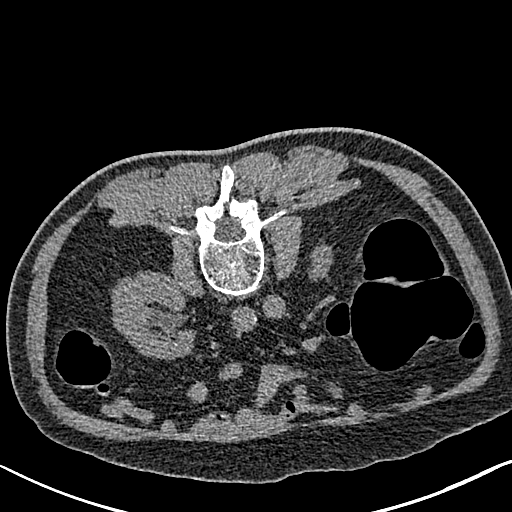
[im 223/335  soft-tissue]
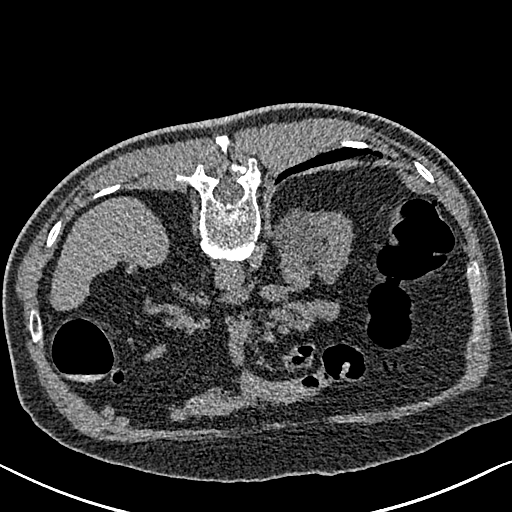
[im 260/335  soft-tissue]
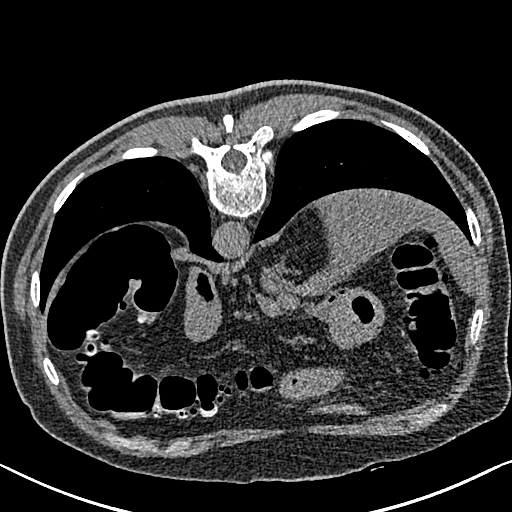
[im 297/335  soft-tissue]
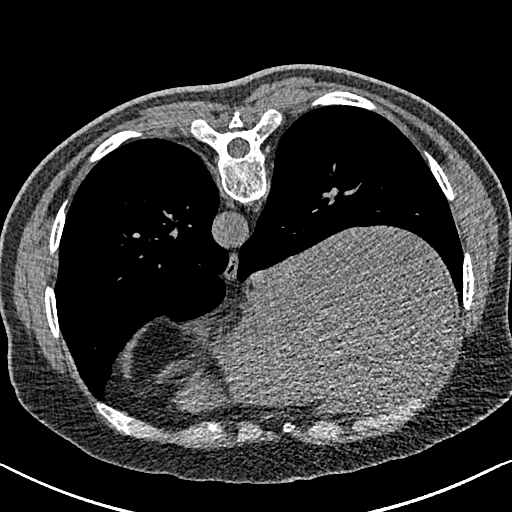

[16 of 46 positions shown; findings below may reference images not displayed]

FINDINGS: VIRTUAL COLONOSCOPY

Large amount of retained barium noted throughout the colon.
Tortuosity of the sigmoid colon. Colonic diverticulosis diffusely.
No active diverticulitis. No fixed non barium tagged polypoid
filling defects or annular constricting lesions.

Virtual colonoscopy is not designed to detect diminutive polyps
(i.e., less than or equal to 5 mm), the presence or absence of which
may not affect clinical management.

CT ABDOMEN AND PELVIS WITHOUT CONTRAST

Lower chest: Lung bases are clear. No effusions. Heart is normal
size.

Hepatobiliary: Prior cholecystectomy. Diffuse fatty infiltration of
the liver. No focal hepatic abnormality.

Pancreas: No focal abnormality or ductal dilatation.

Spleen: No focal abnormality.  Normal size.

Adrenals/Urinary Tract: 3.5 cm low-density lesion in the midpole of
the right kidney, likely cyst although this cannot be fully
characterized on this noncontrast study. No hydronephrosis. Adrenal
glands and urinary bladder unremarkable.

Stomach/Bowel: Stomach and small bowel decompressed, unremarkable.
Normal appendix.

Vascular/Lymphatic: No evidence of aneurysm or adenopathy.

Reproductive: No visible focal abnormality.

Other: No free fluid or free air.

Musculoskeletal: No acute bony abnormality.
IMPRESSION: Large amount of retained layering barium throughout the colon. No
fixed polypoid filling defects or annular constricting lesions.

Diffuse colonic diverticulosis.

Mild diffuse fatty infiltration of the liver.

2.4 cm low-density lesion in the midpole of the right kidney which
likely reflects cysts, but cannot be fully characterized on this
noncontrast study. This could be further evaluated with renal
ultrasound or contrast-enhanced CT as clinically indicated.

## 2020-08-15 ENCOUNTER — Other Ambulatory Visit: Payer: Self-pay | Admitting: Family Medicine

## 2020-08-15 DIAGNOSIS — E1165 Type 2 diabetes mellitus with hyperglycemia: Secondary | ICD-10-CM

## 2020-08-15 DIAGNOSIS — E1169 Type 2 diabetes mellitus with other specified complication: Secondary | ICD-10-CM

## 2020-08-15 DIAGNOSIS — IMO0002 Reserved for concepts with insufficient information to code with codable children: Secondary | ICD-10-CM

## 2020-08-15 DIAGNOSIS — D696 Thrombocytopenia, unspecified: Secondary | ICD-10-CM

## 2020-08-18 ENCOUNTER — Other Ambulatory Visit: Payer: Medicare Other

## 2020-08-18 ENCOUNTER — Other Ambulatory Visit: Payer: Self-pay

## 2020-08-18 ENCOUNTER — Other Ambulatory Visit (INDEPENDENT_AMBULATORY_CARE_PROVIDER_SITE_OTHER): Payer: Medicare Other

## 2020-08-18 DIAGNOSIS — E1169 Type 2 diabetes mellitus with other specified complication: Secondary | ICD-10-CM | POA: Diagnosis not present

## 2020-08-18 DIAGNOSIS — E785 Hyperlipidemia, unspecified: Secondary | ICD-10-CM | POA: Diagnosis not present

## 2020-08-18 DIAGNOSIS — E1165 Type 2 diabetes mellitus with hyperglycemia: Secondary | ICD-10-CM

## 2020-08-18 DIAGNOSIS — D696 Thrombocytopenia, unspecified: Secondary | ICD-10-CM

## 2020-08-18 DIAGNOSIS — E118 Type 2 diabetes mellitus with unspecified complications: Secondary | ICD-10-CM | POA: Diagnosis not present

## 2020-08-18 DIAGNOSIS — IMO0002 Reserved for concepts with insufficient information to code with codable children: Secondary | ICD-10-CM

## 2020-08-18 LAB — CBC WITH DIFFERENTIAL/PLATELET
Basophils Absolute: 0 10*3/uL (ref 0.0–0.1)
Basophils Relative: 0.7 % (ref 0.0–3.0)
Eosinophils Absolute: 0.2 10*3/uL (ref 0.0–0.7)
Eosinophils Relative: 3.5 % (ref 0.0–5.0)
HCT: 40.5 % (ref 39.0–52.0)
Hemoglobin: 13.8 g/dL (ref 13.0–17.0)
Lymphocytes Relative: 35.1 % (ref 12.0–46.0)
Lymphs Abs: 1.6 10*3/uL (ref 0.7–4.0)
MCHC: 34.2 g/dL (ref 30.0–36.0)
MCV: 89.5 fl (ref 78.0–100.0)
Monocytes Absolute: 0.4 10*3/uL (ref 0.1–1.0)
Monocytes Relative: 9.5 % (ref 3.0–12.0)
Neutro Abs: 2.3 10*3/uL (ref 1.4–7.7)
Neutrophils Relative %: 51.2 % (ref 43.0–77.0)
Platelets: 126 10*3/uL — ABNORMAL LOW (ref 150.0–400.0)
RBC: 4.53 Mil/uL (ref 4.22–5.81)
RDW: 13.4 % (ref 11.5–15.5)
WBC: 4.6 10*3/uL (ref 4.0–10.5)

## 2020-08-18 LAB — LIPID PANEL
Cholesterol: 131 mg/dL (ref 0–200)
HDL: 50.8 mg/dL (ref >39.00)
LDL Cholesterol: 65 mg/dL (ref 0–99)
NonHDL: 79.85
Total CHOL/HDL Ratio: 3
Triglycerides: 76 mg/dL (ref 0.0–149.0)
VLDL: 15.2 mg/dL (ref 0.0–40.0)

## 2020-08-18 LAB — COMPREHENSIVE METABOLIC PANEL
ALT: 19 U/L (ref 0–53)
AST: 23 U/L (ref 0–37)
Albumin: 4.3 g/dL (ref 3.5–5.2)
Alkaline Phosphatase: 32 U/L — ABNORMAL LOW (ref 39–117)
BUN: 23 mg/dL (ref 6–23)
CO2: 30 mEq/L (ref 19–32)
Calcium: 9.4 mg/dL (ref 8.4–10.5)
Chloride: 102 mEq/L (ref 96–112)
Creatinine, Ser: 1.22 mg/dL (ref 0.40–1.50)
GFR: 56.07 mL/min — ABNORMAL LOW (ref >60.00)
Glucose, Bld: 134 mg/dL — ABNORMAL HIGH (ref 70–99)
Potassium: 4.4 mEq/L (ref 3.5–5.1)
Sodium: 137 mEq/L (ref 135–145)
Total Bilirubin: 0.6 mg/dL (ref 0.2–1.2)
Total Protein: 6.5 g/dL (ref 6.0–8.3)

## 2020-08-18 LAB — HEMOGLOBIN A1C: Hgb A1c MFr Bld: 7.6 % — ABNORMAL HIGH (ref 4.6–6.5)

## 2020-08-25 ENCOUNTER — Ambulatory Visit: Payer: Medicare Other | Admitting: Family Medicine

## 2020-08-25 ENCOUNTER — Other Ambulatory Visit: Payer: Self-pay

## 2020-08-25 ENCOUNTER — Encounter: Payer: Self-pay | Admitting: Family Medicine

## 2020-08-25 ENCOUNTER — Ambulatory Visit (INDEPENDENT_AMBULATORY_CARE_PROVIDER_SITE_OTHER): Payer: Medicare Other | Admitting: Family Medicine

## 2020-08-25 VITALS — BP 120/70 | HR 73 | Temp 98.0°F | Ht 64.5 in | Wt 152.1 lb

## 2020-08-25 DIAGNOSIS — E1159 Type 2 diabetes mellitus with other circulatory complications: Secondary | ICD-10-CM

## 2020-08-25 DIAGNOSIS — I152 Hypertension secondary to endocrine disorders: Secondary | ICD-10-CM

## 2020-08-25 DIAGNOSIS — E1165 Type 2 diabetes mellitus with hyperglycemia: Secondary | ICD-10-CM

## 2020-08-25 DIAGNOSIS — Z0000 Encounter for general adult medical examination without abnormal findings: Secondary | ICD-10-CM | POA: Diagnosis not present

## 2020-08-25 DIAGNOSIS — N401 Enlarged prostate with lower urinary tract symptoms: Secondary | ICD-10-CM

## 2020-08-25 DIAGNOSIS — D696 Thrombocytopenia, unspecified: Secondary | ICD-10-CM | POA: Diagnosis not present

## 2020-08-25 DIAGNOSIS — K219 Gastro-esophageal reflux disease without esophagitis: Secondary | ICD-10-CM | POA: Diagnosis not present

## 2020-08-25 DIAGNOSIS — E118 Type 2 diabetes mellitus with unspecified complications: Secondary | ICD-10-CM

## 2020-08-25 DIAGNOSIS — E781 Pure hyperglyceridemia: Secondary | ICD-10-CM | POA: Diagnosis not present

## 2020-08-25 DIAGNOSIS — E1169 Type 2 diabetes mellitus with other specified complication: Secondary | ICD-10-CM | POA: Diagnosis not present

## 2020-08-25 DIAGNOSIS — E785 Hyperlipidemia, unspecified: Secondary | ICD-10-CM

## 2020-08-25 DIAGNOSIS — H9193 Unspecified hearing loss, bilateral: Secondary | ICD-10-CM

## 2020-08-25 DIAGNOSIS — IMO0002 Reserved for concepts with insufficient information to code with codable children: Secondary | ICD-10-CM

## 2020-08-25 DIAGNOSIS — R351 Nocturia: Secondary | ICD-10-CM

## 2020-08-25 MED ORDER — TERAZOSIN HCL 1 MG PO CAPS: 1.0000 mg | ORAL_CAPSULE | Freq: Every day | ORAL | 3 refills | Status: DC

## 2020-08-25 MED ORDER — FAMOTIDINE 20 MG PO TABS: 20.0000 mg | ORAL_TABLET | Freq: Every day | ORAL | 3 refills | Status: DC

## 2020-08-25 MED ORDER — FENOFIBRATE 145 MG PO TABS: 145.0000 mg | ORAL_TABLET | Freq: Every day | ORAL | 3 refills | Status: DC

## 2020-08-25 MED ORDER — LISINOPRIL-HYDROCHLOROTHIAZIDE 20-25 MG PO TABS: 1.0000 | ORAL_TABLET | Freq: Every day | ORAL | 3 refills | Status: DC

## 2020-08-25 MED ORDER — METFORMIN HCL ER 500 MG PO TB24: 1000.0000 mg | ORAL_TABLET | Freq: Every day | ORAL | 3 refills | Status: DC

## 2020-08-25 MED ORDER — LOVASTATIN 20 MG PO TABS: ORAL_TABLET | ORAL | 3 refills | Status: DC

## 2020-08-25 NOTE — Assessment & Plan Note (Signed)
Chronic, likely ITP  Continue yearly monitoring, advised watch for easy bruising/bleeding.

## 2020-08-25 NOTE — Assessment & Plan Note (Addendum)
Chronic, stable on lovastatin and fenofibrate  The ASCVD Risk score Ricky Bussing DC Jr., Ricky al., 2013) failed to calculate for the following reasons:   The 2013 ASCVD risk score is only valid for ages 53 to 48

## 2020-08-25 NOTE — Progress Notes (Signed)
Patient ID: Ricky Garrett, male    DOB: May 21, 1940, 80 y.o.   MRN: [id number]  This visit was conducted in person.  BP 120/70   Pulse 73   Temp 98 F (36.7 C) (Temporal)   Ht 5' 4.5" (1.638 m)   Wt 152 lb 2 oz (69 kg)   SpO2 97%   BMI 25.71 kg/m    CC: CPE Subjective:   HPI: Ricky Garrett is a 80 y.o. male presenting on 08/25/2020 for Annual Exam (Prt 2.   Pt accompanied by daughter, [name]- temp 98.0.)   Saw health advisor last month for medicare wellness visit. Note reviewed.   No results found.  Flowsheet Row Clinical Support from 07/15/2020 in [place] at [facility] Total Score 0     Known hearing loss - saw ENT 10/2019 with mild-moderately severe SNHL, has new hearing aides working on getting APP set up on phone.  Fall Risk  07/15/2020 12/23/2018 12/20/2018 10/31/2017 02/23/2017  Falls in the past year? 0 0 0 No No  Comment - - Emmi Telephone Survey: data to providers prior to load - -  Number falls in past yr: 0 - - - -  Injury with Fall? 0 - - - -  Risk for fall due to : Medication side effect - - - -  Follow up Falls evaluation completed;Falls prevention discussed - - - -   DM - did not tolerate trial of SGLT2 due to weigh loss. Only on metformin 19m daily in am. Checks fasting 130-140s, no low sugars.    Preventative: COLONOSCOPY 01/2014 - diverticulosis, 2 polyps, hemorrhoids, rpt 5 yrs (Outlaw)  [facility] Records reviewed - no signs of polyp, 2.4cm hypodensity to R kidney likely cyst consider renal [place]  Prostate cancer screening - known BPH on terazosin. Will age out of prostate cancer screening. Nocturia x2.  Lung cancer screening - not eligible  Flu shot - yearly [facility], 03/2019, booster 10/2019 Tdap 2017 Pneumovax 2017, prevnar 2020 Zostavax - 01/2017 - may have received expired Shingrix - 12/2018, 03/2019  Advanced directive discussion - does not have set up.  Unsure who would be HCPOA. Packet provided today.  Seat belt use discussed Sunscreen use discussed. Would like moles on face checked Ex smoker - quit 1990  Alcohol  - rare  Dentist - yearly  Eye exam - yearly   Lives with daughter ([name] and her husband, 2 grand children, and [place]). 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 Edu - elementary [place]. Activity: walking regularly Diet: good water, fruits/vegetables daily      Relevant past medical, surgical, family and social history reviewed and updated as indicated. Interim medical history since our last visit reviewed. Allergies and medications reviewed and updated. Outpatient Medications Prior to Visit  Medication Sig Dispense Refill   Accu-Chek Softclix Lancets lancets 1 EACH BY OTHER ROUTE AS DIRECTED. USE AS INSTRUCTED TO CHECK BLOOD SUGAR. 100 each 3   aspirin EC 81 MG tablet Take 1 tablet (81 mg total) by mouth every Monday, Wednesday, and Friday.     Blood Glucose Monitoring Suppl (ACCU-CHEK AVIVA PLUS) w/Device KIT Use as directed to check sugars daily and as needed E11.8 1 kit 0   glucose blood (  ACCU-CHEK AVIVA PLUS) test strip Use as instructed to check blood sugar once daily 100 strip 3   Multiple Vitamins-Minerals (CENTRUM SILVER) tablet Take 1 tablet by mouth daily.     dapagliflozin propanediol (FARXIGA) 5 MG TABS tablet Take 1 tablet (5 mg total) by mouth daily before breakfast. 30 tablet 6   famotidine (PEPCID) 20 MG tablet Take 1 tablet (20 mg total) by mouth daily. 150 tablet 1   fenofibrate (TRICOR) 145 MG tablet Take 1 tablet (145 mg total) by mouth daily. 150 tablet 1   lisinopril-hydrochlorothiazide (ZESTORETIC) 20-25 MG tablet Take 1 tablet by mouth daily. 150 tablet 1   lovastatin (MEVACOR) 20 MG tablet Take 1 tablet  nightly for cholesterol 150 tablet 1   metFORMIN (GLUCOPHAGE-XR) 500 MG 24 hr tablet Take 2 tablets (1,000 mg total) by mouth daily with breakfast. 300 tablet 1   terazosin (HYTRIN) 1 MG capsule TAKE 1 CAPSULE BY MOUTH AT BEDTIME. 90 capsule 0   No facility-administered medications prior to visit.     Per HPI unless specifically indicated in ROS section below Review of Systems  Constitutional:  Negative for activity change, appetite change, chills, fatigue, fever and unexpected weight change.  HENT:  Negative for hearing loss.   Eyes:  Negative for visual disturbance.  Respiratory:  Negative for cough, chest tightness, shortness of breath and wheezing.   Cardiovascular:  Negative for chest pain, palpitations and leg swelling.  Gastrointestinal:  Negative for abdominal distention, abdominal pain, blood in stool, constipation, diarrhea, nausea and vomiting.  Genitourinary:  Negative for difficulty urinating and hematuria.  Musculoskeletal:  Negative for arthralgias, myalgias and neck pain.  Skin:  Negative for rash.  Neurological:  Negative for dizziness, seizures, syncope and headaches.  Hematological:  Negative for adenopathy. Does not bruise/bleed easily.  Psychiatric/Behavioral:  Negative for dysphoric mood. The patient is not nervous/anxious.    Objective:  BP 120/70   Pulse 73   Temp 98 F (36.7 C) (Temporal)   Ht 5' 4.5" (1.638 m)   Wt 152 lb 2 oz (69 kg)   SpO2 97%   BMI 25.71 kg/m   Wt Readings from Last 3 Encounters:  08/25/20 152 lb 2 oz (69 kg)  04/19/20 152 lb 3 oz (69 kg)  11/18/19 159 lb (72.1 kg)      Physical Exam Vitals and nursing note reviewed.  Constitutional:      General: He is not in acute distress.    Appearance: Normal appearance. He is well-developed. He is not ill-appearing.  HENT:     Head: Normocephalic and atraumatic.     Right Ear: Hearing, tympanic membrane, ear canal and external ear normal.     Left Ear: Hearing, tympanic membrane, ear  canal and external ear normal.  Eyes:     General: No scleral icterus.    Extraocular Movements: Extraocular movements intact.     Conjunctiva/sclera: Conjunctivae normal.     Pupils: Pupils are equal, round, and reactive to light.  Neck:     Thyroid: No thyroid mass or thyromegaly.     Vascular: No carotid bruit.  Cardiovascular:     Rate and Rhythm: Normal rate and regular rhythm.     Pulses: Normal pulses.          Radial pulses are 2+ on the right side and 2+ on the left side.     Heart sounds: Normal heart sounds. No murmur heard. Pulmonary:     Effort: Pulmonary effort is normal. No respiratory  distress.     Breath sounds: Normal breath sounds. No wheezing, rhonchi or rales.  Abdominal:     General: Bowel sounds are normal. There is no distension.     Palpations: Abdomen is soft. There is no mass.     Tenderness: There is no abdominal tenderness. There is no guarding or rebound.     Hernia: No hernia is present.  Musculoskeletal:        General: Normal range of motion.     Cervical back: Normal range of motion and neck supple.     Right lower leg: No edema.     Left lower leg: No edema.  Lymphadenopathy:     Cervical: No cervical adenopathy.  Skin:    General: Skin is warm and dry.     Findings: No rash.  Neurological:     General: No focal deficit present.     Mental Status: He is alert and oriented to person, place, and time.  Psychiatric:        Mood and Affect: Mood normal.        Behavior: Behavior normal.        Thought Content: Thought content normal.        Judgment: Judgment normal.      Results for orders placed or performed in visit on 08/18/20  CBC with Differential/Platelet  Result Value Ref Range   WBC 4.6 4.0 - 10.5 K/uL   RBC 4.53 4.22 - 5.81 Mil/uL   Hemoglobin 13.8 13.0 - 17.0 g/dL   HCT 40.5 39.0 - 52.0 %   MCV 89.5 78.0 - 100.0 fl   MCHC 34.2 30.0 - 36.0 g/dL   RDW 13.4 11.5 - 15.5 %   Platelets 126.0 (L) 150.0 - 400.0 K/uL   Neutrophils  Relative % 51.2 43.0 - 77.0 %   Lymphocytes Relative 35.1 12.0 - 46.0 %   Monocytes Relative 9.5 3.0 - 12.0 %   Eosinophils Relative 3.5 0.0 - 5.0 %   Basophils Relative 0.7 0.0 - 3.0 %   Neutro Abs 2.3 1.4 - 7.7 K/uL   Lymphs Abs 1.6 0.7 - 4.0 K/uL   Monocytes Absolute 0.4 0.1 - 1.0 K/uL   Eosinophils Absolute 0.2 0.0 - 0.7 K/uL   Basophils Absolute 0.0 0.0 - 0.1 K/uL  Hemoglobin A1c  Result Value Ref Range   Hgb A1c MFr Bld 7.6 (H) 4.6 - 6.5 %  Comprehensive metabolic panel  Result Value Ref Range   Sodium 137 135 - 145 mEq/L   Potassium 4.4 3.5 - 5.1 mEq/L   Chloride 102 96 - 112 mEq/L   CO2 30 19 - 32 mEq/L   Glucose, Bld 134 (H) 70 - 99 mg/dL   BUN 23 6 - 23 mg/dL   Creatinine, Ser 1.22 0.40 - 1.50 mg/dL   Total Bilirubin 0.6 0.2 - 1.2 mg/dL   Alkaline Phosphatase 32 (L) 39 - 117 U/L   AST 23 0 - 37 U/L   ALT 19 0 - 53 U/L   Total Protein 6.5 6.0 - 8.3 g/dL   Albumin 4.3 3.5 - 5.2 g/dL   GFR 56.07 (L) >60.00 mL/min   Calcium 9.4 8.4 - 10.5 mg/dL  Lipid panel  Result Value Ref Range   Cholesterol 131 0 - 200 mg/dL   Triglycerides 76.0 0.0 - 149.0 mg/dL   HDL 50.80 >39.00 mg/dL   VLDL 15.2 0.0 - 40.0 mg/dL   LDL Cholesterol 65 0 - 99 mg/dL   Total CHOL/HDL  Ratio 3    NonHDL 79.85     Assessment & Plan:  This visit occurred during the SARS-CoV-2 public health emergency.  Safety protocols were in place, including screening questions prior to the visit, additional usage of staff PPE, and extensive cleaning of exam room while observing appropriate contact time as indicated for disinfecting solutions.   Problem List Items Addressed This Visit     Health maintenance examination - Primary (Chronic)    Preventative protocols reviewed and updated unless pt declined. Discussed healthy diet and lifestyle.        Dyslipidemia associated with type 2 diabetes mellitus ([place])    Chronic, stable on lovastatin and fenofibrate  The ASCVD Risk score Mikey Bussing DC Jr., et al., 2013)  failed to calculate for the following reasons:   The 2013 ASCVD risk score is only valid for ages 81 to 71        Relevant Medications   fenofibrate (TRICOR) 145 MG tablet   lisinopril-hydrochlorothiazide (ZESTORETIC) 20-25 MG tablet   lovastatin (MEVACOR) 20 MG tablet   metFORMIN (GLUCOPHAGE-XR) 500 MG 24 hr tablet   Thrombocytopenia (HCC)    Chronic, likely ITP  Continue yearly monitoring, advised watch for easy bruising/bleeding.       Hypertension associated with diabetes ([place])    Chronic, stable on zestoretic - continue.       Relevant Medications   fenofibrate (TRICOR) 145 MG tablet   lisinopril-hydrochlorothiazide (ZESTORETIC) 20-25 MG tablet   lovastatin (MEVACOR) 20 MG tablet   metFORMIN (GLUCOPHAGE-XR) 500 MG 24 hr tablet   terazosin (HYTRIN) 1 MG capsule   GERD (gastroesophageal reflux disease)    Stable period on nightly pepcid - continue.        Relevant Medications   famotidine (PEPCID) 20 MG tablet   DM (diabetes mellitus), type 2, uncontrolled with complications (HCC)    Chronic, congratulated on improved control. Continue current regimen, continue efforts towards diabetic diet.        Relevant Medications   lisinopril-hydrochlorothiazide (ZESTORETIC) 20-25 MG tablet   lovastatin (MEVACOR) 20 MG tablet   metFORMIN (GLUCOPHAGE-XR) 500 MG 24 hr tablet   BPH (benign prostatic hyperplasia)    Stable period - continue terazosin.        Hypertriglyceridemia    Continue fibrate.        Relevant Medications   fenofibrate (TRICOR) 145 MG tablet   lisinopril-hydrochlorothiazide (ZESTORETIC) 20-25 MG tablet   lovastatin (MEVACOR) 20 MG tablet   terazosin (HYTRIN) 1 MG capsule   Decreased hearing of both ears    New hearing aides, having trouble calibrating them with phone app.          Meds ordered this encounter  Medications   famotidine (PEPCID) 20 MG tablet    Sig: Take 1 tablet (20 mg total) by mouth daily.    Dispense:  90 tablet     Refill:  3   fenofibrate (TRICOR) 145 MG tablet    Sig: Take 1 tablet (145 mg total) by mouth daily.    Dispense:  90 tablet    Refill:  3   lisinopril-hydrochlorothiazide (ZESTORETIC) 20-25 MG tablet    Sig: Take 1 tablet by mouth daily.    Dispense:  90 tablet    Refill:  3   lovastatin (MEVACOR) 20 MG tablet    Sig: Take 1 tablet nightly for cholesterol    Dispense:  90 tablet    Refill:  3   metFORMIN (GLUCOPHAGE-XR) 500 MG 24 hr tablet  Sig: Take 2 tablets (1,000 mg total) by mouth daily with breakfast.    Dispense:  180 tablet    Refill:  3   terazosin (HYTRIN) 1 MG capsule    Sig: Take 1 capsule (1 mg total) by mouth at bedtime.    Dispense:  90 capsule    Refill:  3   No orders of the defined types were placed in this encounter.    Patient instructions: Gusto verlos hoy. Regresar en 6 meses para proxima visita (diabetes).  Espero tengan buen viaje a [place] en octub[place]!   Follow up plan: Return in about 6 months (around 02/25/2021) for follow up visit.  Ria Bush, MD

## 2020-08-25 NOTE — Assessment & Plan Note (Signed)
Chronic, stable on zestoretic - continue.

## 2020-08-25 NOTE — Patient Instructions (Addendum)
Gusto verlos hoy. Regresar en 6 meses para proxima visita (diabetes).  Espero tengan buen viaje a [place] en octubre!   Mantenimiento de la salud despus de los 23 aos de edad Health Maintenance After Age 80 Despus de los 65 aos de edad, corre un riesgo mayor de Tourist information centre manager enfermedades e infecciones a Barrister's clerk, como tambin de sufrir lesiones por cadas. Las cadas son la causa principal de las fracturas de huesos y lesiones en la cabeza de personas mayores de 30 aos de edad. Recibir cuidados preventivos de forma regular puede ayudarlo a mantenerse saludable y en buen [place]. Los cuidados preventivos incluyen realizarse anlisis de forma regular y Actor en el estilo de vida segn las recomendaciones del mdico. Converse con el mdico sobre lo siguiente: Las pruebas de deteccin y los anlisis que debe Dispensing optician. Una prueba de deteccin es un estudio que se para Hydrographic surveyor la presencia de una enfermedad cuando no tiene sntomas. Un plan de dieta y ejercicios adecuado para usted. Qu debo saber sobre las pruebas de deteccin y los anlisis para prevenircadas? Realizarse pruebas de deteccin y [employer] es la mejor manera de Hydrographic surveyor un problema de salud de forma temprana. El diagnstico y tratamiento tempranos le brindan la mejor oportunidad de Chief Technology Officer las afecciones mdicas que son comunes despus de los 58 aos de edad. Ciertas afecciones y elecciones de estilo de vida pueden hacer que sea ms propenso a sufrir Engineer, manufacturing. El mdico puede recomendarle lo siguiente: Controles regulares de la visin. Una visin deficiente y afecciones como las cataratas pueden hacer que sea ms propenso a sufrir Engineer, manufacturing. Si [place] lentes, asegrese de obtener una receta actualizada si su visin cambia. Revisin de medicamentos. Revise regularmente con el mdico todos los medicamentos que toma, incluidos los medicamentos de [place]. Consulte al [employer] efectos secundarios que pueden  hacer que sea ms propenso a sufrir Engineer, manufacturing. Informe al mdico si alguno de los medicamentos que toma lo hace sentir mareado o somnoliento. Pruebas de deteccin para la osteoporosis. La osteoporosis es una afeccin que hace que los huesos se vuelvan ms frgiles. En consecuencia, los huesos pueden debilitarse y quebrarse ms fcilmente. Pruebas de deteccin para la presin arterial. Los cambios en la presin arterial y los medicamentos para Chief Technology Officer la presin arterial pueden hacerlo sentir mareado. Controles de fuerza y equilibrio. El mdico puede recomendar ciertos estudios para controlar su fuerza y equilibrio al estar de pie, al caminar o al cambiar de posicin. Examen de los pies. El dolor y Chiropractor en los pies, como tambin no utilizar el calzado [place], pueden hacer que sea ms propenso a sufrir Engineer, manufacturing. Prueba de deteccin de la depresin. Es ms probable que sufra una cada si tiene miedo a caerse, se siente mal emocionalmente o se siente incapaz de Patent examiner. Prueba de deteccin de consumo de alcohol. Beber demasiado alcohol puede afectar su equilibrio y puede hacer que sea ms propenso a sufrir Engineer, manufacturing. Qu medidas puedo tomar para reducir mi riesgo de sufrir una cada? Instrucciones generales Hable con el mdico sobre sus riesgos de sufrir una cada. Infrmele al mdico si: Se cae. Asegrese de informarle a su mdico acerca de todas las cadas, incluso aquellas que parecen ser [employer] Se siente mareado, somnoliento o que pierde el equilibrio. Use los medicamentos de venta libre y los recetados solamente como se lo haya indicado el mdico. Estos incluyen todos los suplementos. Siga una dieta sana y [place] un peso saludable.  Una dieta saludable incluye productos lcteos descremados, carnes bajas en contenido de grasa ([place]), fibra de granos enteros, frijoles y [place] frutas y verduras. Seguridad en el hogar Retire los objetos que puedan  causar tropiezos tales como alfombras, cables u obstculos. Instale equipos de seguridad, como barras para sostn en los baos y barandas de seguridad en las escaleras. [employer] habitaciones y los pasillos bien iluminados. Actividad  Siga un programa de ejercicio regular para mantenerse en forma. Esto lo ayudar a Contractor equilibrio. Consulte al mdico qu tipos de ejercicios son adecuados para usted. Si necesita un bastn o un andador, selo segn las recomendaciones del mdico. Utilice calzado con buen apoyo y suela antideslizante.  Estilo de vida No beba alcohol si el mdico se lo prohbe. Si bebe alcohol, limite la cantidad que consume: De 0 a 1 medida por da para las mujeres. De 0 a 2 medidas por da para los hombres. Est atento a la cantidad de alcohol que hay en las bebidas que toma. En los [place], una medida equivale a una botella tpica de cerveza (12 oz/355 ml), medio vaso de vino (5 oz/148 ml) o un trago de bebidas alcohlicas de alta graduacin (1 oz/45 ml). No consuma ningn producto que contenga nicotina o tabaco, como cigarrillos y Psychologist, sport and exercise. Si necesita ayuda para dejar de consumir, consulte al [employer]. Resumen Tener un estilo de vida saludable y recibir cuidados preventivos pueden ayudar a Theatre stage manager salud y el bienestar despus de los 70 aos de [place]. Realizarse pruebas de deteccin y [employer] es la mejor manera de Hydrographic surveyor un problema de salud de forma temprana y Lourena Simmonds a Product/process development scientist una cada. El diagnstico y tratamiento tempranos le brindan la mejor oportunidad de Chief Technology Officer las afecciones mdicas ms comunes en las personas mayores de 60 aos de edad. Las cadas son la causa principal de las fracturas de huesos y lesiones en la cabeza de personas mayores de 36 aos de edad. Tome precauciones para evitar una cada en su casa. Trabaje con el mdico para saber qu cambios que puede hacer para mejorar su salud y [place], y [place]. Esta informacin no tiene Marine scientist el consejo del mdico. Asegresede hacerle al mdico cualquier pregunta que tenga. Document Revised: 01/27/2020 Document Reviewed: 01/27/2020 Elsevier Patient Education  2022 [employer].

## 2020-08-25 NOTE — Assessment & Plan Note (Signed)
Continue fibrate.

## 2020-08-25 NOTE — Assessment & Plan Note (Signed)
Stable period - continue terazosin.

## 2020-08-25 NOTE — Assessment & Plan Note (Signed)
New hearing aides, having trouble calibrating them with phone app.

## 2020-08-25 NOTE — Assessment & Plan Note (Signed)
Stable period on nightly pepcid - continue.

## 2020-08-25 NOTE — Assessment & Plan Note (Signed)
Chronic, congratulated on improved control. Continue current regimen, continue efforts towards diabetic diet.

## 2020-08-25 NOTE — Assessment & Plan Note (Signed)
Preventative protocols reviewed and updated unless pt declined. Discussed healthy diet and lifestyle.  

## 2020-09-27 DIAGNOSIS — H43392 Other vitreous opacities, left eye: Secondary | ICD-10-CM | POA: Diagnosis not present

## 2020-09-27 DIAGNOSIS — E119 Type 2 diabetes mellitus without complications: Secondary | ICD-10-CM | POA: Diagnosis not present

## 2020-09-27 DIAGNOSIS — H43811 Vitreous degeneration, right eye: Secondary | ICD-10-CM | POA: Diagnosis not present

## 2020-09-27 DIAGNOSIS — H2513 Age-related nuclear cataract, bilateral: Secondary | ICD-10-CM | POA: Diagnosis not present

## 2020-09-27 DIAGNOSIS — H43821 Vitreomacular adhesion, right eye: Secondary | ICD-10-CM | POA: Diagnosis not present

## 2020-09-27 LAB — HM DIABETES EYE EXAM

## 2021-01-19 ENCOUNTER — Other Ambulatory Visit: Payer: Self-pay | Admitting: Family Medicine

## 2021-01-19 DIAGNOSIS — E118 Type 2 diabetes mellitus with unspecified complications: Secondary | ICD-10-CM

## 2021-01-20 NOTE — Telephone Encounter (Signed)
Refill left on vm at pharmacy.  

## 2021-02-14 ENCOUNTER — Encounter: Payer: Self-pay | Admitting: Family Medicine

## 2021-03-08 ENCOUNTER — Other Ambulatory Visit: Payer: Self-pay

## 2021-03-08 ENCOUNTER — Ambulatory Visit (INDEPENDENT_AMBULATORY_CARE_PROVIDER_SITE_OTHER): Payer: Medicare Other | Admitting: Family Medicine

## 2021-03-08 ENCOUNTER — Other Ambulatory Visit: Payer: Self-pay | Admitting: Family Medicine

## 2021-03-08 ENCOUNTER — Encounter: Payer: Self-pay | Admitting: Family Medicine

## 2021-03-08 ENCOUNTER — Ambulatory Visit: Payer: Medicare Other | Admitting: Family Medicine

## 2021-03-08 VITALS — BP 134/70 | HR 64 | Temp 97.7°F | Ht 64.5 in | Wt 154.1 lb

## 2021-03-08 DIAGNOSIS — R351 Nocturia: Secondary | ICD-10-CM

## 2021-03-08 DIAGNOSIS — E785 Hyperlipidemia, unspecified: Secondary | ICD-10-CM | POA: Diagnosis not present

## 2021-03-08 DIAGNOSIS — N401 Enlarged prostate with lower urinary tract symptoms: Secondary | ICD-10-CM | POA: Diagnosis not present

## 2021-03-08 DIAGNOSIS — E1169 Type 2 diabetes mellitus with other specified complication: Secondary | ICD-10-CM | POA: Diagnosis not present

## 2021-03-08 DIAGNOSIS — E781 Pure hyperglyceridemia: Secondary | ICD-10-CM

## 2021-03-08 DIAGNOSIS — D696 Thrombocytopenia, unspecified: Secondary | ICD-10-CM | POA: Diagnosis not present

## 2021-03-08 LAB — LIPID PANEL
Cholesterol: 158 mg/dL (ref 0–200)
HDL: 54.2 mg/dL (ref >39.00)
LDL Cholesterol: 81 mg/dL (ref 0–99)
NonHDL: 103.6
Total CHOL/HDL Ratio: 3
Triglycerides: 113 mg/dL (ref 0.0–149.0)
VLDL: 22.6 mg/dL (ref 0.0–40.0)

## 2021-03-08 LAB — CBC WITH DIFFERENTIAL/PLATELET
Basophils Absolute: 0 10*3/uL (ref 0.0–0.1)
Basophils Relative: 0.7 % (ref 0.0–3.0)
Eosinophils Absolute: 0.1 10*3/uL (ref 0.0–0.7)
Eosinophils Relative: 2.3 % (ref 0.0–5.0)
HCT: 44 % (ref 39.0–52.0)
Hemoglobin: 14.4 g/dL (ref 13.0–17.0)
Lymphocytes Relative: 30.9 % (ref 12.0–46.0)
Lymphs Abs: 1.6 10*3/uL (ref 0.7–4.0)
MCHC: 32.8 g/dL (ref 30.0–36.0)
MCV: 89.7 fl (ref 78.0–100.0)
Monocytes Absolute: 0.5 10*3/uL (ref 0.1–1.0)
Monocytes Relative: 9.6 % (ref 3.0–12.0)
Neutro Abs: 2.9 10*3/uL (ref 1.4–7.7)
Neutrophils Relative %: 56.5 % (ref 43.0–77.0)
Platelets: 133 10*3/uL — ABNORMAL LOW (ref 150.0–400.0)
RBC: 4.9 Mil/uL (ref 4.22–5.81)
RDW: 13.5 % (ref 11.5–15.5)
WBC: 5.2 10*3/uL (ref 4.0–10.5)

## 2021-03-08 LAB — RENAL FUNCTION PANEL
Albumin: 4.8 g/dL (ref 3.5–5.2)
BUN: 25 mg/dL — ABNORMAL HIGH (ref 6–23)
CO2: 35 mEq/L — ABNORMAL HIGH (ref 19–32)
Calcium: 10.5 mg/dL (ref 8.4–10.5)
Chloride: 99 mEq/L (ref 96–112)
Creatinine, Ser: 1.16 mg/dL (ref 0.40–1.50)
GFR: 59.34 mL/min — ABNORMAL LOW (ref >60.00)
Glucose, Bld: 176 mg/dL — ABNORMAL HIGH (ref 70–99)
Phosphorus: 3 mg/dL (ref 2.3–4.6)
Potassium: 4.3 mEq/L (ref 3.5–5.1)
Sodium: 138 mEq/L (ref 135–145)

## 2021-03-08 LAB — POC URINALSYSI DIPSTICK (AUTOMATED)
Bilirubin, UA: NEGATIVE
Blood, UA: NEGATIVE
Glucose, UA: NEGATIVE
Ketones, UA: NEGATIVE
Leukocytes, UA: NEGATIVE
Nitrite, UA: NEGATIVE
Protein, UA: POSITIVE — AB
Spec Grav, UA: 1.025 (ref 1.010–1.025)
Urobilinogen, UA: 0.2 E.U./dL
pH, UA: 6 (ref 5.0–8.0)

## 2021-03-08 LAB — POCT GLYCOSYLATED HEMOGLOBIN (HGB A1C): Hemoglobin A1C: 9.1 % — AB (ref 4.0–5.6)

## 2021-03-08 MED ORDER — DEXCOM G6 RECEIVER DEVI: 1.0000 [IU] | 0 refills | Status: DC

## 2021-03-08 MED ORDER — ACCU-CHEK SOFTCLIX LANCETS MISC: 3 refills | Status: DC

## 2021-03-08 MED ORDER — DEXCOM G6 SENSOR MISC: 1.0000 | 6 refills | Status: DC

## 2021-03-08 MED ORDER — EMPAGLIFLOZIN 10 MG PO TABS: 10.0000 mg | ORAL_TABLET | Freq: Every day | ORAL | 11 refills | Status: DC

## 2021-03-08 MED ORDER — ACCU-CHEK AVIVA PLUS VI STRP: ORAL_STRIP | 3 refills | Status: DC

## 2021-03-08 NOTE — Addendum Note (Signed)
Addended by: Brenton Grills on: 09/06/5795 28:20 AM   Modules accepted: Orders

## 2021-03-08 NOTE — Progress Notes (Signed)
Patient ID: Ricky Garrett, male    DOB: 24-Sep-1940, 80 y.o.   MRN: [id number]  This visit was conducted in person.  BP 134/70    Pulse 64    Temp 97.7 F (36.5 C) (Temporal)    Ht 5' 4.5" (1.638 m)    Wt 154 lb 2 oz (69.9 kg)    SpO2 95%    BMI 26.05 kg/m    CC: 6 mo DM f/u visit  Subjective:   HPI: Ricky Garrett is a 81 y.o. male presenting on 03/08/2021 for Diabetes (Here for 6 mo f/u. Pt accompanied by daughter, Ricky Garrett. )   Notes ongoing nocturia 3-4x/night despite terazosin.   DM - does regularly check sugars twice daily fasting (130s) and postprandial (140s). Compliant with antihyperglycemic regimen which includes: metformin XR 1026m daily. Denies low sugars or hypoglycemic symptoms. Denies paresthesias, blurry vision. Last diabetic eye exam seen 2 months ago - Dr Ricky Garrett Glucometer brand: Accu-chek aviva plus. Last foot exam: 03/2020. DSME: saw nutritionist 07/2019. Prefers to avoid injectable medication if able.  Lab Results  Component Value Date   HGBA1C 9.1 (A) 03/08/2021   Diabetic Foot Exam - Simple   No data filed    No results found for: MDerl Barrow      Relevant past medical, surgical, family and social history reviewed and updated as indicated. Interim medical history since our last visit reviewed. Allergies and medications reviewed and updated. Outpatient Medications Prior to Visit  Medication Sig Dispense Refill   aspirin EC 81 MG tablet Take 1 tablet (81 mg total) by mouth every Monday, Wednesday, and Friday.     Blood Glucose Monitoring Suppl (ACCU-CHEK AVIVA PLUS) w/Device KIT Use as directed to check sugars daily and as needed E11.8 1 kit 0   famotidine (PEPCID) 20 MG tablet Take 1 tablet (20 mg total) by mouth daily. 90 tablet 3   fenofibrate (TRICOR) 145 MG tablet Take 1 tablet (145 mg total) by mouth daily. 90 tablet 3   lisinopril-hydrochlorothiazide (ZESTORETIC) 20-25 MG tablet Take 1 tablet by mouth daily. 90 tablet 3   lovastatin (MEVACOR) 20  MG tablet Take 1 tablet nightly for cholesterol 90 tablet 3   metFORMIN (GLUCOPHAGE-XR) 500 MG 24 hr tablet Take 2 tablets (1,000 mg total) by mouth daily with breakfast. 180 tablet 3   Multiple Vitamins-Minerals (CENTRUM SILVER) tablet Take 1 tablet by mouth daily.     terazosin (HYTRIN) 1 MG capsule Take 1 capsule (1 mg total) by mouth at bedtime. 90 capsule 3   ACCU-CHEK AVIVA PLUS test strip USE AS INSTRUCTED TO CHECK BLOOD SUGAR ONCE DAILY 100 strip 3   Accu-Chek Softclix Lancets lancets 1 EACH BY OTHER ROUTE AS DIRECTED. USE AS INSTRUCTED TO CHECK BLOOD SUGAR. 100 each 3   No facility-administered medications prior to visit.     Per HPI unless specifically indicated in ROS section below Review of Systems  Objective:  BP 134/70    Pulse 64    Temp 97.7 F (36.5 C) (Temporal)    Ht 5' 4.5" (1.638 m)    Wt 154 lb 2 oz (69.9 kg)    SpO2 95%    BMI 26.05 kg/m   Wt Readings from Last 3 Encounters:  03/08/21 154 lb 2 oz (69.9 kg)  08/25/20 152 lb 2 oz (69 kg)  04/19/20 152 lb 3 oz (69 kg)      Physical Exam Vitals and nursing note reviewed.  Constitutional:  Appearance: Normal appearance. He is not ill-appearing.  Eyes:     Extraocular Movements: Extraocular movements intact.     Conjunctiva/sclera: Conjunctivae normal.     Pupils: Pupils are equal, round, and reactive to light.  Cardiovascular:     Rate and Rhythm: Normal rate and regular rhythm.     Pulses: Normal pulses.     Heart sounds: Normal heart sounds. No murmur heard. Pulmonary:     Effort: Pulmonary effort is normal. No respiratory distress.     Breath sounds: Normal breath sounds. No wheezing, rhonchi or rales.  Musculoskeletal:     Right lower leg: No edema.     Left lower leg: No edema.     Comments: See HPI for foot exam if done  Skin:    General: Skin is warm and dry.     Findings: No rash.  Neurological:     Mental Status: He is alert.  Psychiatric:        Mood and Affect: Mood normal.         Behavior: Behavior normal.      Results for orders placed or performed in visit on 03/08/21  POCT glycosylated hemoglobin (Hb A1C)  Result Value Ref Range   Hemoglobin A1C 9.1 (A) 4.0 - 5.6 %   HbA1c POC (<> result, manual entry)     HbA1c, POC (prediabetic range)     HbA1c, POC (controlled diabetic range)      Assessment & Plan:  This visit occurred during the SARS-CoV-2 public health emergency.  Safety protocols were in place, including screening questions prior to the visit, additional usage of staff PPE, and extensive cleaning of exam room while observing appropriate contact time as indicated for disinfecting solutions.   Problem List Items Addressed This Visit     Dyslipidemia associated with type 2 diabetes mellitus ([place])    Continues lovastatin and fenofibrate.  Update FLP today.       Relevant Medications   empagliflozin (JARDIANCE) 10 MG TABS tablet   Other Relevant Orders   Lipid panel   Thrombocytopenia (HCC)    Chronic, stable anticipate ITP. Update CBC today.       Relevant Orders   CBC with Differential/Platelet   Type 2 diabetes mellitus with other specified complication ([place]) - Primary    Chronic, deteriorated control noted. Previously did not feel well when on farxiga. Agrees to try jardiance. Reviewed possible risk of UTI, yeast infection, groin cellulitis. Encouraged work towards low sugar low carb diabetic diet for diabetic control. Prefers to avoid injectable therapies. RTC 3 mo DM f/u visit.       Relevant Medications   empagliflozin (JARDIANCE) 10 MG TABS tablet   glucose blood (ACCU-CHEK AVIVA PLUS) test strip   Other Relevant Orders   POCT glycosylated hemoglobin (Hb A1C) (Completed)   Renal function panel   BPH (benign prostatic hyperplasia)    Nocturia x3-4 at night. Check UA today.  No improvement on terazosin. Consider increased dose pending jardiance tolerance.       Hypertriglyceridemia     Meds ordered this encounter  Medications    Continuous Blood Gluc Sensor (DEXCOM G6 SENSOR) MISC    Sig: 1 Lipoprotein Lipase Releasing Unit by Does not apply route as directed.    Dispense:  2 each    Refill:  6   Continuous Blood Gluc Receiver (DEXCOM G6 RECEIVER) DEVI    Sig: 1 Units by Does not apply route as directed.    Dispense:  1  each    Refill:  0   DISCONTD: glucose blood (ACCU-CHEK AVIVA PLUS) test strip    Sig: Use as instructed to check sugars twice daily E11.69    Dispense:  100 strip    Refill:  3   empagliflozin (JARDIANCE) 10 MG TABS tablet    Sig: Take 1 tablet (10 mg total) by mouth daily before breakfast.    Dispense:  30 tablet    Refill:  11   Accu-Chek Softclix Lancets lancets    Sig: Use as instructed to check sugars twice daily E11.69    Dispense:  200 each    Refill:  3   glucose blood (ACCU-CHEK AVIVA PLUS) test strip    Sig: Use as instructed to check sugars twice daily E11.69    Dispense:  200 strip    Refill:  3   Orders Placed This Encounter  Procedures   Renal function panel   CBC with Differential/Platelet   Lipid panel   POCT glycosylated hemoglobin (Hb A1C)     Patient Instructions  We will request latest eye exam from Dr Warden Fillers.  Labs and urinalysis today.  Azucar estaba demasiado alta - comienze jardiance (empaglifozin) 72m diarios. Esto ayuda a bControl and instrumentation engineerpor la orina.  He m[employer] su farmacia a ver si el seguro lo cubre. Si no, he mandado test strips para accu-chek para revisar dos veces al dia. Revise el azucar o en ayunas (antes de comida) o 2 horas despues de comer. Anote niveles de aChief of Staffy traiga a la proxima visita.  Regresar en 3 meses para seguimiento de diabetes.  Regresar en 6 meses para proximo fisico.   Follow up plan: Return in about 3 months (around 06/05/2021) for follow up visit.  JRia Bush MD

## 2021-03-08 NOTE — Assessment & Plan Note (Signed)
Chronic, stable anticipate ITP. Update CBC today.

## 2021-03-08 NOTE — Patient Instructions (Addendum)
We will request latest eye exam from Dr Warden Fillers.  Labs and urinalysis today.  Azucar estaba demasiado alta - comienze jardiance (empaglifozin) 10mg  diarios. Esto ayuda a Control and instrumentation engineer por la orina.  He [employer] a su farmacia a ver si el seguro lo cubre. Si no, he mandado test strips para accu-chek para revisar dos veces al dia. Revise el azucar o en ayunas (antes de comida) o 2 horas despues de comer. Anote niveles de Chief of Staff y traiga a la proxima visita.  Regresar en 3 meses para seguimiento de diabetes.  Regresar en 6 meses para proximo fisico.

## 2021-03-08 NOTE — Assessment & Plan Note (Addendum)
Chronic, deteriorated control noted. Previously did not feel well when on farxiga. Agrees to try jardiance. Reviewed possible risk of UTI, yeast infection, groin cellulitis. Encouraged work towards low sugar low carb diabetic diet for diabetic control. Prefers to avoid injectable therapies. RTC 3 mo DM f/u visit.

## 2021-03-08 NOTE — Assessment & Plan Note (Signed)
Continues lovastatin and fenofibrate.  Update FLP today.

## 2021-03-08 NOTE — Assessment & Plan Note (Addendum)
Nocturia x3-4 at night. Check UA today.  No improvement on terazosin. Consider increased dose pending jardiance tolerance.

## 2021-03-10 ENCOUNTER — Encounter: Payer: Self-pay | Admitting: Family Medicine

## 2021-03-10 ENCOUNTER — Other Ambulatory Visit: Payer: Self-pay | Admitting: Family Medicine

## 2021-03-10 NOTE — Telephone Encounter (Signed)
Opened in error

## 2021-03-16 ENCOUNTER — Encounter: Payer: Self-pay | Admitting: Family Medicine

## 2021-03-23 MED ORDER — EMPAGLIFLOZIN 10 MG PO TABS: 10.0000 mg | ORAL_TABLET | Freq: Every day | ORAL | 1 refills | Status: DC

## 2021-03-25 MED ORDER — GLIMEPIRIDE 1 MG PO TABS: 1.0000 mg | ORAL_TABLET | Freq: Every day | ORAL | 0 refills | Status: DC

## 2021-03-25 NOTE — Addendum Note (Signed)
Addended by: Ria Bush on: 03/25/2021 08:37 AM   Modules accepted: Orders

## 2021-06-06 ENCOUNTER — Ambulatory Visit: Payer: Medicare Other | Admitting: Family Medicine

## 2021-06-21 ENCOUNTER — Encounter: Payer: Self-pay | Admitting: Family Medicine

## 2021-06-21 ENCOUNTER — Ambulatory Visit (INDEPENDENT_AMBULATORY_CARE_PROVIDER_SITE_OTHER): Payer: Medicare Other | Admitting: Family Medicine

## 2021-06-21 VITALS — BP 114/62 | HR 54 | Temp 97.4°F | Ht 64.5 in | Wt 147.1 lb

## 2021-06-21 DIAGNOSIS — N401 Enlarged prostate with lower urinary tract symptoms: Secondary | ICD-10-CM

## 2021-06-21 DIAGNOSIS — E785 Hyperlipidemia, unspecified: Secondary | ICD-10-CM

## 2021-06-21 DIAGNOSIS — E781 Pure hyperglyceridemia: Secondary | ICD-10-CM

## 2021-06-21 DIAGNOSIS — R300 Dysuria: Secondary | ICD-10-CM | POA: Diagnosis not present

## 2021-06-21 DIAGNOSIS — R351 Nocturia: Secondary | ICD-10-CM

## 2021-06-21 DIAGNOSIS — E1169 Type 2 diabetes mellitus with other specified complication: Secondary | ICD-10-CM

## 2021-06-21 LAB — POC URINALSYSI DIPSTICK (AUTOMATED)
Bilirubin, UA: NEGATIVE
Blood, UA: NEGATIVE
Glucose, UA: POSITIVE — AB
Ketones, UA: NEGATIVE
Leukocytes, UA: NEGATIVE
Nitrite, UA: NEGATIVE
Protein, UA: NEGATIVE
Spec Grav, UA: 1.015 (ref 1.010–1.025)
Urobilinogen, UA: 0.2 E.U./dL
pH, UA: 6 (ref 5.0–8.0)

## 2021-06-21 LAB — POCT GLYCOSYLATED HEMOGLOBIN (HGB A1C): Hemoglobin A1C: 6.3 % — AB (ref 4.0–5.6)

## 2021-06-21 MED ORDER — EMPAGLIFLOZIN 10 MG PO TABS: 10.0000 mg | ORAL_TABLET | ORAL | 1 refills | Status: DC

## 2021-06-21 NOTE — Addendum Note (Signed)
Addended by: Ria Bush on: 06/21/2021 01:46 PM   Modules accepted: Orders

## 2021-06-21 NOTE — Patient Instructions (Addendum)
We will request latest eye exam from Dr Katy Fitch 08/2020 Urinalysis today.  Haga cita con nuestra farmacista Lidsey para discutir monitor continuo para Teaching laboratory technician ([place]).  Suspenda fenofibrato medicina para colesterol.  Siga lovastatina. Baje dosis de jardiance (empagliflozin) a lunes, miercoles, viernes.  Regresar en 3 meses para fisico.

## 2021-06-21 NOTE — Assessment & Plan Note (Signed)
Hasn't noted significant improvement on terazosin - will trial off this as he desires to minimize medications. jardiance may also be worsening urinary frequency - see above.

## 2021-06-21 NOTE — Progress Notes (Addendum)
Patient ID: Ricky Garrett, male    DOB: [birth year], 81 y.o.   MRN: [id number]  This visit was conducted in person.  BP 114/62   Pulse (!) 54   Temp (!) 97.4 F (36.3 C) (Temporal)   Ht 5' 4.5" (1.638 m)   Wt 147 lb 2 oz (66.7 kg)   SpO2 97%   BMI 24.86 kg/m    CC: DM f/u visit  Subjective:   HPI: Ricky Garrett is a 81 y.o. male presenting on 06/21/2021 for Diabetes (Here for f/u.  Accompanied by daughter, [name]. )   DM - does regularly check sugars, fasting 120 this morning. Compliant with antihyperglycemic regimen which includes: jardiance 30m daily, metformin XR 10068mdaily. 7lb weight loss noted since starting jardiance, also notes weakness since starting medication. Prefers to avoid injectable medication. Denies low sugars or hypoglycemic symptoms. Denies paresthesias, blurry vision. Last diabetic eye exam 08/2020 - will request records. Glucometer brand: accucke. Last foot exam: 03/2020 - DUE. DSME: 07/2019. Lab Results  Component Value Date   HGBA1C 6.3 (A) 06/21/2021   Diabetic Foot Exam - Simple   Simple Foot Form Diabetic Foot exam was performed with the following findings: Yes 06/21/2021 10:07 AM  Visual Inspection No deformities, no ulcerations, no other skin breakdown bilaterally: Yes Sensation Testing Intact to touch and monofilament testing bilaterally: Yes Pulse Check Posterior Tibialis and Dorsalis pulse intact bilaterally: Yes Comments 2+ DP bilaterally    No results found for: MICROALBUR, [id number]   BPH with nocturia x2-3 - despite terazosin. Desires trial off this. Notes occasional dysuria.      Relevant past medical, surgical, family and social history reviewed and updated as indicated. Interim medical history since our last visit reviewed. Allergies and medications reviewed and updated. Outpatient Medications Prior to Visit  Medication Sig Dispense Refill   Accu-Chek Softclix Lancets lancets Use as instructed to check sugars twice daily E11.69 200  each 3   aspirin EC 81 MG tablet Take 1 tablet (81 mg total) by mouth every Monday, Wednesday, and Friday.     Blood Glucose Monitoring Suppl (ACCU-CHEK AVIVA PLUS) w/Device KIT Use as directed to check sugars daily and as needed E11.8 1 kit 0   Continuous Blood Gluc Receiver (DEXCOM G6 RECEIVER) DEVI 1 Units by Does not apply route as directed. 1 each 0   Continuous Blood Gluc Sensor (DEXCOM G6 SENSOR) MISC USE AS DIRECTED 3 each 6   famotidine (PEPCID) 20 MG tablet Take 1 tablet (20 mg total) by mouth daily. 90 tablet 3   glucose blood (ACCU-CHEK AVIVA PLUS) test strip Use as instructed to check sugars twice daily E11.69 200 strip 3   lisinopril-hydrochlorothiazide (ZESTORETIC) 20-25 MG tablet Take 1 tablet by mouth daily. 90 tablet 3   lovastatin (MEVACOR) 20 MG tablet Take 1 tablet nightly for cholesterol 90 tablet 3   metFORMIN (GLUCOPHAGE-XR) 500 MG 24 hr tablet Take 2 tablets (1,000 mg total) by mouth daily with breakfast. 180 tablet 3   Multiple Vitamins-Minerals (CENTRUM SILVER) tablet Take 1 tablet by mouth daily.     empagliflozin (JARDIANCE) 10 MG TABS tablet Take 1 tablet (10 mg total) by mouth daily before breakfast. 60 tablet 1   fenofibrate (TRICOR) 145 MG tablet Take 1 tablet (145 mg total) by mouth daily. 90 tablet 3   glimepiride (AMARYL) 1 MG tablet Take 1 tablet (1 mg total) by mouth daily with breakfast. 90 tablet 0   terazosin (HYTRIN) 1 MG capsule Take 1 capsule (  1 mg total) by mouth at bedtime. 90 capsule 3   No facility-administered medications prior to visit.     Per HPI unless specifically indicated in ROS section below Review of Systems  Objective:  BP 114/62   Pulse (!) 54   Temp (!) 97.4 F (36.3 C) (Temporal)   Ht 5' 4.5" (1.638 m)   Wt 147 lb 2 oz (66.7 kg)   SpO2 97%   BMI 24.86 kg/m   Wt Readings from Last 3 Encounters:  06/21/21 147 lb 2 oz (66.7 kg)  03/08/21 154 lb 2 oz (69.9 kg)  08/25/20 152 lb 2 oz (69 kg)      Physical Exam Vitals and  nursing note reviewed.  Constitutional:      Appearance: Normal appearance. He is not ill-appearing.  Eyes:     Extraocular Movements: Extraocular movements intact.     Conjunctiva/sclera: Conjunctivae normal.     Pupils: Pupils are equal, round, and reactive to light.  Cardiovascular:     Rate and Rhythm: Normal rate and regular rhythm.     Pulses: Normal pulses.     Heart sounds: Normal heart sounds. No murmur heard. Pulmonary:     Effort: Pulmonary effort is normal. No respiratory distress.     Breath sounds: Normal breath sounds. No wheezing, rhonchi or rales.  Musculoskeletal:     Right lower leg: No edema.     Left lower leg: No edema.     Comments: See HPI for foot exam if done  Skin:    General: Skin is warm and dry.     Findings: No rash.  Neurological:     Mental Status: He is alert.  Psychiatric:        Mood and Affect: Mood normal.        Behavior: Behavior normal.      Results for orders placed or performed in visit on 06/21/21  POCT glycosylated hemoglobin (Hb A1C)  Result Value Ref Range   Hemoglobin A1C 6.3 (A) 4.0 - 5.6 %   HbA1c POC (<> result, manual entry)     HbA1c, POC (prediabetic range)     HbA1c, POC (controlled diabetic range)    POCT Urinalysis Dipstick (Automated)  Result Value Ref Range   Color, UA yellow    Clarity, UA clear    Glucose, UA Positive (A) Negative   Bilirubin, UA negative    Ketones, UA negative    Spec Grav, UA 1.015 1.010 - 1.025   Blood, UA negative    pH, UA 6.0 5.0 - 8.0   Protein, UA Negative Negative   Urobilinogen, UA 0.2 0.2 or 1.0 E.U./dL   Nitrite, UA negative    Leukocytes, UA Negative Negative   Lab Results  Component Value Date   CREATININE 1.16 03/08/2021   BUN 25 (H) 03/08/2021   NA 138 03/08/2021   K 4.3 03/08/2021   CL 99 03/08/2021   CO2 35 (H) 03/08/2021    Assessment & Plan:   Problem List Items Addressed This Visit     Dyslipidemia associated with type 2 diabetes mellitus ([place])    Desires  trial off fenofibrate which is reasonable. Continue lovastatin. Update FLP when he returns for CPE.  The ASCVD Risk score (Arnett DK, et al., 2019) failed to calculate for the following reasons:   The 2019 ASCVD risk score is only valid for ages 66 to 40       Relevant Medications   empagliflozin (JARDIANCE) 10 MG TABS  tablet   Type 2 diabetes mellitus with other specified complication (HCC) - Primary    Chronic, marked improvement with addition of jardiance, however notes weight loss and some fatigue. Will drop to MWF dosing and reassess at f/u visit. They are interested in CGM - I have asked them to schedule appt with our pharmacist to go over CGM teaching and see about cost - discussed likely not candidate through medicare due to no insulin use. Foot exam today.  Will request latest DM eye exam done 08/2020.        Relevant Medications   empagliflozin (JARDIANCE) 10 MG TABS tablet   Other Relevant Orders   POCT glycosylated hemoglobin (Hb A1C) (Completed)   AMB Referral to Community Care Coordinaton   BPH (benign prostatic hyperplasia)    Hasn't noted significant improvement on terazosin - will trial off this as he desires to minimize medications. jardiance may also be worsening urinary frequency - see above.        Hypertriglyceridemia    Trial off fibrate, reassess control at CPE in 3 months.        Other Visit Diagnoses     Dysuria       Relevant Orders   POCT Urinalysis Dipstick (Automated) (Completed)        Meds ordered this encounter  Medications   empagliflozin (JARDIANCE) 10 MG TABS tablet    Sig: Take 1 tablet (10 mg total) by mouth every other day. With breakfast    Dispense:  45 tablet    Refill:  1   Orders Placed This Encounter  Procedures   AMB Referral to [facility] Coordinaton    Referral Priority:   Routine    Referral Type:   Consultation    Referral Reason:   Care Coordination    Number of Visits Requested:   1   POCT glycosylated  hemoglobin (Hb A1C)   POCT Urinalysis Dipstick (Automated)     Patient Instructions  We will request latest eye exam from Dr Katy Fitch 08/2020 Urinalysis today.  Haga cita con nuestra farmacista Lidsey para discutir monitor continuo para Teaching laboratory technician ([place]).  Suspenda fenofibrato medicina para colesterol.  Siga lovastatina. Baje dosis de jardiance (empagliflozin) a lunes, miercoles, viernes.  Regresar en 3 meses para fisico.   Follow up plan: Return in about 3 months (around 09/21/2021) for annual exam, prior fasting for blood work.  Ria Bush, MD

## 2021-06-21 NOTE — Assessment & Plan Note (Signed)
Desires trial off fenofibrate which is reasonable. Continue lovastatin. Update FLP when he returns for CPE.  The ASCVD Risk score (Arnett DK, et al., 2019) failed to calculate for the following reasons:   The 2019 ASCVD risk score is only valid for ages 34 to 40

## 2021-06-21 NOTE — Assessment & Plan Note (Signed)
Trial off fibrate, reassess control at CPE in 3 months.

## 2021-06-21 NOTE — Assessment & Plan Note (Addendum)
Chronic, marked improvement with addition of jardiance, however notes weight loss and some fatigue. Will drop to MWF dosing and reassess at f/u visit. They are interested in CGM - I have asked them to schedule appt with our pharmacist to go over CGM teaching and see about cost - discussed likely not candidate through medicare due to no insulin use. Foot exam today.  Will request latest DM eye exam done 08/2020.

## 2021-06-22 ENCOUNTER — Telehealth: Payer: Self-pay | Admitting: Family Medicine

## 2021-06-22 NOTE — Progress Notes (Signed)
  Chronic Care Management   Note  06/22/2021 Name: Ricky Garrett MRN: [id number] DOB: December 11, 1940  Ricky Garrett is a 81 y.o. year old male who is a primary care patient of Ria Bush, MD. I reached out to [facility] by phone today in response to a referral sent by Mr. Duane Boston PCP, Ria Bush, MD.   [name] was given information about Chronic Care Management services today including:  CCM service includes personalized support from designated clinical staff supervised by his physician, including individualized plan of care and coordination with other care providers 24/7 contact phone numbers for assistance for urgent and routine care needs. Service will only be billed when office clinical staff spend 20 minutes or more in a month to coordinate care. Only one practitioner may furnish and bill the service in a calendar month. The patient may stop CCM services at any time (effective at the end of the month) by phone call to the office staff.   NORA/DAUGHTER verbally agreed to assistance and services provided by embedded care coordination/care management team today.  Follow up plan:REFERRAL/ NO COPAY   Tatjana Dellinger Upstream Scheduler

## 2021-06-22 NOTE — Telephone Encounter (Signed)
Error message

## 2021-06-23 ENCOUNTER — Telehealth: Payer: Self-pay

## 2021-06-23 NOTE — Chronic Care Management (AMB) (Signed)
    Chronic Care Management Pharmacy Assistant   Name: [name]  MRN: [id number] DOB: July 29, 1940  Ricky Garrett is an 81 y.o. year old male who presents for his initial CCM visit with the clinical pharmacist.  Reason for Encounter: Initial Questions  CGM discussion    Conditions to be addressed/monitored: HTN, HLD, and DMII   Recent office visits:  06/21/21-Javier Gutierrez,MD(PCP)- referral for CCM to discuss CGM.Trial off of terazosin, UA-f/u 3 months 03/08/21-Javier Gutierrez,MD(PCP)-f/u diabetes,lipid panel ordered,UA (results in spanish)(A1c 9.1) Start Jardiance 10mg  take 1 tablet daily   Recent consult visits:  None in last 6 months  Hospital visits:  None in previous 6 months  Medications: Outpatient Encounter Medications as of 06/23/2021  Medication Sig   Accu-Chek Softclix Lancets lancets Use as instructed to check sugars twice daily E11.69   aspirin EC 81 MG tablet Take 1 tablet (81 mg total) by mouth every Monday, Wednesday, and Friday.   Blood Glucose Monitoring Suppl (ACCU-CHEK AVIVA PLUS) w/Device KIT Use as directed to check sugars daily and as needed E11.8   Continuous Blood Gluc Receiver (DEXCOM G6 RECEIVER) DEVI 1 Units by Does not apply route as directed.   Continuous Blood Gluc Sensor (DEXCOM G6 SENSOR) MISC USE AS DIRECTED   empagliflozin (JARDIANCE) 10 MG TABS tablet Take 1 tablet (10 mg total) by mouth every other day. With breakfast   famotidine (PEPCID) 20 MG tablet Take 1 tablet (20 mg total) by mouth daily.   glucose blood (ACCU-CHEK AVIVA PLUS) test strip Use as instructed to check sugars twice daily E11.69   lisinopril-hydrochlorothiazide (ZESTORETIC) 20-25 MG tablet Take 1 tablet by mouth daily.   lovastatin (MEVACOR) 20 MG tablet Take 1 tablet nightly for cholesterol   metFORMIN (GLUCOPHAGE-XR) 500 MG 24 hr tablet Take 2 tablets (1,000 mg total) by mouth daily with breakfast.   Multiple Vitamins-Minerals (CENTRUM SILVER) tablet Take 1 tablet by mouth  daily.   No facility-administered encounter medications on file as of 06/23/2021.    Lab Results  Component Value Date/Time   HGBA1C 6.3 (A) 06/21/2021 09:27 AM   HGBA1C 9.1 (A) 03/08/2021 09:18 AM   HGBA1C 7.6 (H) 08/18/2020 09:29 AM   HGBA1C 5.9 (H) 11/03/2009 10:58 PM     BP Readings from Last 3 Encounters:  06/21/21 114/62  03/08/21 134/70  08/25/20 120/70      Patient contacted to confirm in office appointment with Charlene Brooke, Pharm D, on 06/30/21 at 1:30pm.  Spoke with daughter [name].Confirmed she and her sister will be there and no interpreter is needed for this appointment.  Do you have any problems getting your medications? No  What is your top health concern you would like to discuss at your upcoming visit? Discuss CGM  Have you seen any other providers since your last visit with PCP? No     CCM referral has been placed prior to visit?  Yes   Star Rating Drugs:  Medication:   Last Fill: Day Supply Jardiance 10mg   06/05/21  60 Metformin 1000mg   04/16/21 90 Lisinopril/HCTZ 20-25mg  04/16/21 90 Lovastatin 20mg   04/16/21 90  Care Gaps: Annual wellness visit in last year? Yes Most Recent BP reading:114/62  06/21/21  If Diabetic: Most recent A1C reading:6.3  06/21/21 Last eye exam / retinopathy screening: UTD Last diabetic foot exam:UTD  Charlene Brooke, CPP notified  Avel Sensor, [place]  [phone]

## 2021-06-24 ENCOUNTER — Encounter: Payer: Self-pay | Admitting: Family Medicine

## 2021-06-29 DIAGNOSIS — L570 Actinic keratosis: Secondary | ICD-10-CM | POA: Diagnosis not present

## 2021-06-29 DIAGNOSIS — L814 Other melanin hyperpigmentation: Secondary | ICD-10-CM | POA: Diagnosis not present

## 2021-06-29 DIAGNOSIS — D692 Other nonthrombocytopenic purpura: Secondary | ICD-10-CM | POA: Diagnosis not present

## 2021-06-29 DIAGNOSIS — L237 Allergic contact dermatitis due to plants, except food: Secondary | ICD-10-CM | POA: Diagnosis not present

## 2021-06-29 DIAGNOSIS — D2372 Other benign neoplasm of skin of left lower limb, including hip: Secondary | ICD-10-CM | POA: Diagnosis not present

## 2021-06-29 DIAGNOSIS — D225 Melanocytic nevi of trunk: Secondary | ICD-10-CM | POA: Diagnosis not present

## 2021-06-29 DIAGNOSIS — Z85828 Personal history of other malignant neoplasm of skin: Secondary | ICD-10-CM | POA: Diagnosis not present

## 2021-06-29 DIAGNOSIS — D1801 Hemangioma of skin and subcutaneous tissue: Secondary | ICD-10-CM | POA: Diagnosis not present

## 2021-06-29 DIAGNOSIS — D2371 Other benign neoplasm of skin of right lower limb, including hip: Secondary | ICD-10-CM | POA: Diagnosis not present

## 2021-06-29 DIAGNOSIS — L821 Other seborrheic keratosis: Secondary | ICD-10-CM | POA: Diagnosis not present

## 2021-06-30 ENCOUNTER — Ambulatory Visit (INDEPENDENT_AMBULATORY_CARE_PROVIDER_SITE_OTHER): Payer: Medicare Other | Admitting: Pharmacist

## 2021-06-30 DIAGNOSIS — I152 Hypertension secondary to endocrine disorders: Secondary | ICD-10-CM

## 2021-06-30 DIAGNOSIS — E1169 Type 2 diabetes mellitus with other specified complication: Secondary | ICD-10-CM

## 2021-06-30 MED ORDER — FREESTYLE LIBRE 3 SENSOR MISC: 3 refills | Status: DC

## 2021-06-30 NOTE — Progress Notes (Unsigned)
Chronic Care Management Pharmacy Note  07/01/2021 Name:  Ricky Garrett MRN:  [id number] DOB:  Sep 29, 1940  Summary: CCM Initial visit -Reviewed CGM options: Freestyle Libre will be cheaper than Dexcom out-of-pocket (~140/month vs 180/month; CGM not covered by Medicare since he is not on insulin -Pt provided with 1 Freestyle Libre 3 sample and 1 voucher for free sensor at pharmacy  Patient presented for [employer] training. Sensor sample provided. Patient opted to use smartphone as the reader. We downloaded Freestyle Libre 3 app on his phone and set up the app. Advised pt not to turn off phone so as to not to miss potential alarms. Provided overview of how to navigate app and use several features including adjusting alarm settings, logbook and daily patterns.   Demonstrated how to apply sensor and applied sensor to L arm today. Demonstrated how to pair sensor with [place] App, sensor was successfully paired. Advised pt how to navigate to app and scan sensor for sugar readings. Advised to change sensor every 14 days or as directed by the app. Advised pt to avoid high doses of Vitamin C (>500 mg/day) and to remove sensor prior to imaging (X-ray, CT or MRI scans). Provided patient with customer service phone number in case of device malfunction: [phone].   Patient opted to connect to clinic [place] account to share data: Yes  Pt voiced understanding of above and denies further questions.  Plan: -Pharmacist follow up televisit scheduled for 1 month -PCP F/U 09/23/21     Subjective: Ricky Garrett is an 81 y.o. year old male who is a primary patient of Ria Bush, MD.  The CCM team was consulted for assistance with disease management and care coordination needs.    Engaged with patient face to face for initial visit in response to provider referral for pharmacy case management and/or care coordination services.   Consent to Services:  The patient was given the following  information about Chronic Care Management services today, agreed to services, and gave verbal consent: 1. CCM service includes personalized support from designated clinical staff supervised by the primary care provider, including individualized plan of care and coordination with other care providers 2. 24/7 contact phone numbers for assistance for urgent and routine care needs. 3. Service will only be billed when office clinical staff spend 20 minutes or more in a month to coordinate care. 4. Only one practitioner may furnish and bill the service in a calendar month. 5.The patient may stop CCM services at any time (effective at the end of the month) by phone call to the office staff. 6. The patient will be responsible for cost sharing (co-pay) of up to 20% of the service fee (after annual deductible is met). Patient agreed to services and consent obtained.  Patient Care Team: Ria Bush, MD as PCP - General (Family Medicine) Heath Lark, MD as Consulting Physician (Hematology and Oncology) Charlton Haws, [facility] as Pharmacist (Pharmacist)  Recent office visits: 06/21/21 PCP OV: f/u - trial off terazosin due to lack of benefit. Reduce Jardiance to MWF. Trial off fibrate. Continue lovastatin.  Recent consult visits: [facility] visits: N/a   Objective:  Lab Results  Component Value Date   CREATININE 1.16 03/08/2021   BUN 25 (H) 03/08/2021   GFR 59.34 (L) 03/08/2021   GFRNONAA 69 10/31/2017   GFRAA 80 10/31/2017   NA 138 03/08/2021   K 4.3 03/08/2021   CALCIUM 10.5 03/08/2021   CO2 35 (H) 03/08/2021   GLUCOSE 176 (  H) 03/08/2021    Lab Results  Component Value Date/Time   HGBA1C 6.3 (A) 06/21/2021 09:27 AM   HGBA1C 9.1 (A) 03/08/2021 09:18 AM   HGBA1C 7.6 (H) 08/18/2020 09:29 AM   HGBA1C 5.9 (H) 11/03/2009 10:58 PM   GFR 59.34 (L) 03/08/2021 10:12 AM   GFR 56.07 (L) 08/18/2020 09:29 AM    Last diabetic Eye exam:  Lab Results  Component Value Date/Time    HMDIABEYEEXA No Retinopathy 09/27/2020 12:00 AM    Last diabetic Foot exam: No results found for: HMDIABFOOTEX   Lab Results  Component Value Date   CHOL 158 03/08/2021   HDL 54.20 03/08/2021   LDLCALC 81 03/08/2021   LDLDIRECT 77 06/17/2012   TRIG 113.0 03/08/2021   CHOLHDL 3 03/08/2021       Latest Ref Rng & Units 03/08/2021   10:12 AM 08/18/2020    9:29 AM 12/23/2018    5:14 PM  Hepatic Function  Total Protein 6.0 - 8.3 g/dL  6.5   7.3    Albumin 3.5 - 5.2 g/dL 4.8   4.3   4.4    AST 0 - 37 U/L  23   30    ALT 0 - 53 U/L  19   26    Alk Phosphatase 39 - 117 U/L  32   32    Total Bilirubin 0.2 - 1.2 mg/dL  0.6   0.4      Lab Results  Component Value Date/Time   TSH 3.432 03/18/2009 09:13 PM       Latest Ref Rng & Units 03/08/2021   10:12 AM 08/18/2020    9:29 AM 06/24/2019   11:07 AM  CBC  WBC 4.0 - 10.5 K/uL 5.2   4.6   4.9    Hemoglobin 13.0 - 17.0 g/dL 14.4   13.8   13.9    Hematocrit 39.0 - 52.0 % 44.0   40.5   41.5    Platelets 150.0 - 400.0 K/uL 133.0   126.0   127.0      No results found for: VD25OH  Clinical ASCVD: No  The ASCVD Risk score (Arnett DK, et al., 2019) failed to calculate for the following reasons:   The 2019 ASCVD risk score is only valid for ages 65 to 78       07/15/2020    3:31 PM 12/23/2018    4:05 PM 10/31/2017    8:47 AM  Depression screen PHQ 2/9  Decreased Interest 0 0 0  Down, Depressed, Hopeless 0 0 0  PHQ - 2 Score 0 0 0  Altered sleeping 0    Tired, decreased energy 0    Change in appetite 0    Feeling bad or failure about yourself  0    Trouble concentrating 0    Moving slowly or fidgety/restless 0    Suicidal thoughts 0    PHQ-9 Score 0    Difficult doing work/chores Not difficult at all       Social History   Tobacco Use  Smoking Status Former   Packs/day: 0.50   Years: 1.00   Pack years: 0.50   Types: Cigarettes   Quit date: 1990   Years since quitting: 33.4  Smokeless Tobacco Never   BP Readings from  Last 3 Encounters:  06/21/21 114/62  03/08/21 134/70  08/25/20 120/70   Pulse Readings from Last 3 Encounters:  06/21/21 (!) 54  03/08/21 64  08/25/20 73   Wt Readings from Last  3 Encounters:  06/21/21 147 lb 2 oz (66.7 kg)  03/08/21 154 lb 2 oz (69.9 kg)  08/25/20 152 lb 2 oz (69 kg)   BMI Readings from Last 3 Encounters:  06/21/21 24.86 kg/m  03/08/21 26.05 kg/m  08/25/20 25.71 kg/m    Assessment/Interventions: Review of patient past medical history, allergies, medications, health status, including review of consultants reports, laboratory and other test data, was performed as part of comprehensive evaluation and provision of chronic care management services.   SDOH:  (Social Determinants of Health) assessments and interventions performed: Yes SDOH Interventions    Flowsheet Row Most Recent Value  SDOH Interventions   Food Insecurity Interventions Intervention Not Indicated  Financial Strain Interventions Intervention Not Indicated      SDOH Screenings   Alcohol Screen: Low Risk    Last Alcohol Screening Score (AUDIT): 1  Depression (PHQ2-9): Low Risk    PHQ-2 Score: 0  Financial Resource Strain: Low Risk    Difficulty of Paying Living Expenses: Not very hard  Food Insecurity: No Food Insecurity   Worried About Charity fundraiser in the Last Year: Never true   Ran Out of Food in the Last Year: Never true  Housing: Low Risk    Last Housing Risk Score: 0  Physical Activity: Insufficiently Active   Days of Exercise per Week: 2 days   Minutes of Exercise per Session: 30 min  Social Connections: Not on file  Stress: No Stress Concern Present   Feeling of Stress : Not at all  Tobacco Use: Medium Risk   Smoking Tobacco Use: Former   Smokeless Tobacco Use: Never   Passive Exposure: Not on file  Transportation Needs: No Transportation Needs   Lack of Transportation (Medical): No   Lack of Transportation (Non-Medical): No    CCM Care Plan  No Known  Allergies  Medications Reviewed Today     Reviewed by Charlton Haws, [facility] (Pharmacist) on 07/01/21 at 0931  Med List Status: <None>   Medication Order Taking? Sig Documenting Provider Last Dose Status Informant  Accu-Chek Softclix Lancets lancets [id number] Yes Use as instructed to check sugars twice daily E11.69 Ria Bush, MD Taking Active   aspirin EC 81 MG tablet [id number] Yes Take 1 tablet (81 mg total) by mouth every Monday, Wednesday, and Friday. Ria Bush, MD Taking Active   Continuous Blood Gluc Sensor (FREESTYLE LIBRE 3 SENSOR) [place] [id number] Yes Place 1 sensor on the skin every 14 days. Use to check glucose continuously Ria Bush, MD Taking Active   empagliflozin (JARDIANCE) 10 MG TABS tablet [id number] Yes Take 1 tablet (10 mg total) by mouth every other day. With breakfast Ria Bush, MD Taking Active   famotidine (PEPCID) 20 MG tablet [id number] Yes Take 1 tablet (20 mg total) by mouth daily. Ria Bush, MD Taking Active   glucose blood (ACCU-CHEK AVIVA PLUS) test strip [id number] Yes Use as instructed to check sugars twice daily E11.69 Ria Bush, MD Taking Active   lisinopril-hydrochlorothiazide (ZESTORETIC) 20-25 MG tablet [id number] Yes Take 1 tablet by mouth daily. Ria Bush, MD Taking Active   lovastatin (MEVACOR) 20 MG tablet [id number] Yes Take 1 tablet nightly for cholesterol Ria Bush, MD Taking Active   metFORMIN (GLUCOPHAGE-XR) 500 MG 24 hr tablet [id number] Yes Take 2 tablets (1,000 mg total) by mouth daily with breakfast. Ria Bush, MD Taking Active   Multiple Vitamins-Minerals (CENTRUM SILVER) tablet [id number] Yes Take 1 tablet by mouth daily. Clent Demark, PA-C Taking Active  Patient Active Problem List   Diagnosis Date Noted   Decreased hearing of both ears 11/18/2019   Irregular heart beat 11/18/2019   Medicare annual wellness visit, subsequent 12/23/2018   Health  maintenance examination 12/23/2018   Advanced care planning/counseling discussion 12/23/2018   Hypertriglyceridemia 02/26/2017   Left external ear skin cancer status post resection 09/16/2015   BPH (benign prostatic hyperplasia) 01/06/2014   Tubular adenoma of colon 09/12/2013   Type 2 diabetes mellitus with other specified complication ([place]) 69/48/5462   Insomnia 03/26/2012   GERD (gastroesophageal reflux disease) 09/13/2011   Thrombocytopenia ([place]) 12/30/2009   Dyslipidemia associated with type 2 diabetes mellitus ([place]) 06/13/2007   ALLERGIC RHINITIS 06/13/2007   Hypertension associated with diabetes ([place]) 09/28/2006    Immunization History  Administered Date(s) Administered   Influenza Inj Mdck Quad Pf 10/30/2017   Influenza Split 11/14/2010   Influenza Whole 11/29/2007, 11/09/2008, 11/04/2010   Influenza, High Dose Seasonal PF 10/20/2014, 11/09/2015, 10/24/2018, 10/23/2019, 10/21/2020   Influenza,inj,Quad PF,6+ Mos 09/24/2012   Influenza-Unspecified 10/30/2013, 02/27/2016, 10/10/2016   PFIZER(Purple Top)SARS-COV-2 Vaccination 02/13/2019, 03/06/2019, 11/12/2019, 07/06/2020   Pneumococcal Conjugate-13 08/30/2018   Pneumococcal Polysaccharide-23 10/14/2009, 01/05/2016   Td 03/30/2005   Tdap 01/05/2016   Zoster Recombinat (Shingrix) 01/09/2019, 04/04/2019   Zoster, Live 02/23/2017    Conditions to be addressed/monitored:  Hypertension, Hyperlipidemia, and Diabetes  Care Plan : [place]  Updates made by Charlton Haws, [place] since 07/01/2021 12:00 AM     Problem: Hypertension, Hyperlipidemia, and Diabetes   Priority: High     Long-Range Goal: Disease mgmt   Start Date: 07/01/2021  Expected End Date: 07/02/2022  This Visit's Progress: On track  Priority: High  Note:   Current Barriers:  Unable to independently monitor therapeutic efficacy  Pharmacist Clinical Goal(s):  Patient will achieve adherence to monitoring guidelines and medication adherence to  achieve therapeutic efficacy through collaboration with PharmD and provider.   Interventions: 1:1 collaboration with Ria Bush, MD regarding development and update of comprehensive plan of care as evidenced by provider attestation and co-signature Inter-disciplinary care team collaboration (see longitudinal plan of care) Comprehensive medication review performed; medication list updated in electronic medical record  Hypertension (BP goal <130/80) -Controlled - BP at goal in recent clinic visits -Current treatment: Lisinopril/HCTZ 20-25 mg daily - Appropriate, Effective, Safe, Accessible -Medications previously tried: n/a  -Educated on BP goals and benefits of medications for prevention of heart attack, stroke and kidney damage; -Counseled to monitor BP at home periodically -Recommended to continue current medication  Hyperlipidemia: (LDL goal < 100) -Controlled - LDL 81 (03/2021) at goal -Current treatment: Lovastatin 20 mg daily -Appropriate, Effective, Safe, Accessible Aspirin 81 mg daily -Appropriate, Effective, Safe, Accessible -Medications previously tried: fenofibrate  -Educated on Cholesterol goals;  -Recommended to continue current medication  Diabetes (A1c goal <7%) -Controlled - A1c 6.3% (05/2021) at goal, pt is interested in CGM  -Denies hypoglycemic/hyperglycemic symptoms -Current medications: Jardiance 10 mg QOD - Appropriate, Effective, Safe, Accessible Metformin XR 500 mg - 2 tab daily -Appropriate, Effective, Safe, Accessible Accu Chek -Medications previously tried: glipizide  -Reviewed CGM options - pt has Medicare so CGM will not be covered since he is not on insulin, he will consider paying out of pocket; Freestyle Elenor Legato is cheaper than Dexcom so decided to sample [employer] 3 - trained patient and daughter on [place] 3 (see summary above) -Recommended to continue current medication; Start [employer] 3 - sample provided + 1 free sensor at  pharmacy  Patient Goals/Self-Care Activities Patient will:  - take medications as prescribed as evidenced by patient report and record review focus on medication adherence by routine check glucose using [facility], document, and provide at future appointments       Medication Assistance: None required.  Patient affirms current coverage meets needs.  Compliance/Adherence/Medication fill history: Care Gaps: None  Star-Rating Drugs: Jardiance - PDC 100% Metformin - PDC 100% Lisinopril/HCTZ - PDC 100% Lovastatin - PDC 100%  Medication Access: Within the past 30 days, how often has patient missed a dose of medication? 0 Is a pillbox or other method used to improve adherence? No  Factors that may affect medication adherence? no barriers identified Are meds synced by current pharmacy? No  Are meds delivered by current pharmacy? No  Does patient experience delays in picking up medications due to transportation concerns? No   Upstream Services Reviewed: Is patient disadvantaged to use [facility]?: No  Current Rx insurance plan: UHC Dual complete Name and location of Current pharmacy:  [id number] [place] [id number]: [phone]: [phone] [facility] Pharm[facility] services reviewed with patient today?: No   Care Plan and Follow Up Patient Decision:  Patient agrees to Care Plan and Follow-up.  Plan: Telephone follow up appointment with care management team member scheduled for:  1 month  LCharlene Brooke PharmD, BWellstar Sylvan Grove HospitalClinical Pharmacist LOsmondPrimary Care at [facility]3936 [phone]

## 2021-07-01 NOTE — Patient Instructions (Signed)
Visit Information  Phone number for Pharmacist: [phone]   Goals Addressed   None     Care Plan : [place]  Updates made by Charlton Haws, RPH since 07/01/2021 12:00 AM     Problem: Hypertension, Hyperlipidemia, and Diabetes   Priority: High     Long-Range Goal: Disease mgmt   Start Date: 07/01/2021  Expected End Date: 07/02/2022  This Visit's Progress: On track  Priority: High  Note:   Current Barriers:  Unable to independently monitor therapeutic efficacy  Pharmacist Clinical Goal(s):  Patient will achieve adherence to monitoring guidelines and medication adherence to achieve therapeutic efficacy through collaboration with PharmD and provider.   Interventions: 1:1 collaboration with Ria Bush, MD regarding development and update of comprehensive plan of care as evidenced by provider attestation and co-signature Inter-disciplinary care team collaboration (see longitudinal plan of care) Comprehensive medication review performed; medication list updated in electronic medical record  Hypertension (BP goal <130/80) -Controlled - BP at goal in recent clinic visits -Current treatment: Lisinopril/HCTZ 20-25 mg daily - Appropriate, Effective, Safe, Accessible -Medications previously tried: n/a  -Educated on BP goals and benefits of medications for prevention of heart attack, stroke and kidney damage; -Counseled to monitor BP at home periodically -Recommended to continue current medication  Hyperlipidemia: (LDL goal < 100) -Controlled - LDL 81 (03/2021) at goal -Current treatment: Lovastatin 20 mg daily -Appropriate, Effective, Safe, Accessible Aspirin 81 mg daily -Appropriate, Effective, Safe, Accessible -Medications previously tried: fenofibrate  -Educated on Cholesterol goals;  -Recommended to continue current medication  Diabetes (A1c goal <7%) -Controlled - A1c 6.3% (05/2021) at goal, pt is interested in CGM  -Denies  hypoglycemic/hyperglycemic symptoms -Current medications: Jardiance 10 mg QOD - Appropriate, Effective, Safe, Accessible Metformin XR 500 mg - 2 tab daily -Appropriate, Effective, Safe, Accessible Accu Chek -Medications previously tried: glipizide  -Reviewed CGM options - pt has Medicare so CGM will not be covered since he is not on insulin, he will consider paying out of pocket; Freestyle Elenor Legato is cheaper than Dexcom so decided to sample [employer] 3 - trained patient and daughter on [place] 3 (see summary above) -Recommended to continue current medication; Start [employer] 3 - sample provided + 1 free sensor at pharmacy  Patient Goals/Self-Care Activities Patient will:  - take medications as prescribed as evidenced by patient report and record review focus on medication adherence by routine check glucose using [employer], document, and provide at future appointments       Patient verbalizes understanding of instructions and care plan provided today and agrees to view in [place]. Active MyChart status and patient understanding of how to access instructions and care plan via MyChart confirmed with patient.    Telephone follow up appointment with pharmacy team member scheduled for: 1 month  Charlene Brooke, PharmD, [facility] Clinical Pharmacist [facility] [phone]

## 2021-07-18 ENCOUNTER — Ambulatory Visit: Payer: Medicare Other

## 2021-07-26 ENCOUNTER — Ambulatory Visit (INDEPENDENT_AMBULATORY_CARE_PROVIDER_SITE_OTHER): Payer: Medicare Other

## 2021-07-26 VITALS — Ht 64.5 in | Wt 147.0 lb

## 2021-07-26 DIAGNOSIS — Z0000 Encounter for general adult medical examination without abnormal findings: Secondary | ICD-10-CM

## 2021-07-26 NOTE — Progress Notes (Signed)
Subjective:   Ricky Garrett is a 81 y.o. male who presents for Medicare Annual/Subsequent preventive examination. Virtual Visit via Telephone Note  I connected with  Ricky Garrett on 07/26/21 at 11:15 AM EDT by telephone and verified that I am speaking with the correct person using two identifiers.  Location: Patient: HOME Provider: [facility] Persons participating in the virtual visit: patient/Nurse Health Advisor   I discussed the limitations, risks, security and privacy concerns of performing an evaluation and management service by telephone and the availability of in person appointments. The patient expressed understanding and agreed to proceed.  Interactive audio and video telecommunications were attempted between this nurse and patient, however failed, due to patient having technical difficulties OR patient did not have access to video capability.  We continued and completed visit with audio only.  Some vital signs may be absent or patient reported.   Chriss Driver, LPN  Review of Systems     Cardiac Risk Factors include: advanced age (>74mn, >>38women);diabetes mellitus;hypertension;dyslipidemia;male gender;sedentary lifestyle     Objective:    Today's Vitals   07/26/21 1118  Weight: 147 lb (66.7 kg)  Height: 5' 4.5" (1.638 m)   Body mass index is 24.84 kg/m.     07/26/2021   11:25 AM 07/15/2020    3:31 PM 07/04/2016    9:43 AM 07/02/2015    2:07 PM 05/14/2015   12:24 PM 01/28/2015    1:39 PM 09/21/2014    1:40 PM  Advanced Directives  Does Patient Have a Medical Advance Directive? Yes No No No Yes No No  Type of [place]    Living will;Healthcare Power of Attorney    Does patient want to make changes to medical advance directive?     No - Patient declined    Copy of HDietrichin Chart? No - copy requested    No - copy requested    Would patient like information on creating a medical advance  directive?  No - Patient declined No - Patient declined No - patient declined information  No - patient declined information No - patient declined information    Current Medications (verified) Outpatient Encounter Medications as of 07/26/2021  Medication Sig   Accu-Chek Softclix Lancets lancets Use as instructed to check sugars twice daily E11.69   aspirin EC 81 MG tablet Take 1 tablet (81 mg total) by mouth every Monday, Wednesday, and Friday.   Continuous Blood Gluc Sensor (FREESTYLE LIBRE 3 SENSOR) MISC Place 1 sensor on the skin every 14 days. Use to check glucose continuously   empagliflozin (JARDIANCE) 10 MG TABS tablet Take 1 tablet (10 mg total) by mouth every other day. With breakfast   famotidine (PEPCID) 20 MG tablet Take 1 tablet (20 mg total) by mouth daily.   glimepiride (AMARYL) 1 MG tablet  (Patient not taking: Reported on 07/26/2021)   glucose blood (ACCU-CHEK AVIVA PLUS) test strip Use as instructed to check sugars twice daily E11.69   lisinopril-hydrochlorothiazide (ZESTORETIC) 20-25 MG tablet Take 1 tablet by mouth daily.   lovastatin (MEVACOR) 20 MG tablet Take 1 tablet nightly for cholesterol   metFORMIN (GLUCOPHAGE-XR) 500 MG 24 hr tablet Take 2 tablets (1,000 mg total) by mouth daily with breakfast.   Multiple Vitamins-Minerals (CENTRUM SILVER) tablet Take 1 tablet by mouth daily.   omeprazole (PRILOSEC) 40 MG capsule Take by mouth.   No facility-administered encounter medications on file as of 07/26/2021.  Allergies (verified) Patient has no known allergies.   History: Past Medical History:  Diagnosis Date   Cancer of skin of left ear 2019   seen by a dermatologist in [place]   Decreased hearing    pt declines   Diabetes mellitus without complication ([place])    GERD (gastroesophageal reflux disease)    History of stomach ulcers    Hyperlipidemia    Hypertension    Pigmented skin lesion 03/2020   R occipital scalp - pigmented lichen planus-like keratosis  ([place])   Rhinitis, allergic    Thrombocytopenia ([facility])    Past Surgical History:  Procedure Laterality Date   COLONOSCOPY  2011   1 polyp (outlaw)   COLONOSCOPY  01/2014   diverticulosis, 2 polyps, hemorrhoids, rpt 5 yrs (Outlaw)   NASAL SEPTUM SURGERY  1990   Family History  Problem Relation Age of Onset   CAD Neg Hx    Stroke Neg Hx    Diabetes Neg Hx    Cancer Neg Hx    Social History   Socioeconomic History   Marital status: Married    Spouse name: Tamera Stands de [place]uello [place]   Number of children: Not on file   Years of education: Not on file   Highest education level: Not on file  Occupational History    Employer: [employer]  Tobacco Use   Smoking status: Former    Packs/day: 0.50    Years: 1.00    Total pack years: 0.50    Types: Cigarettes    Quit date: 1990    Years since quitting: 33.5   Smokeless tobacco: Never  Substance and Sexual Activity   Alcohol use: Not Currently    Alcohol/week: 2.0 standard drinks of alcohol    Types: 1 Glasses of wine, 1 Cans of beer per week   Drug use: No   Sexual activity: Yes    Partners: Female  Other Topics Concern   Not on file  Social History Narrative   Lives with daughter [name]) and her husband, 2 grand children, and wife    Moved from [place] to the [place] (2008).    Was living in [place] and moved to [place] first and then to [facility] (2008).    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 Cares for grandchildren.    Edu - 5th grade    Living in [place] with wife and daughter's family.    Activity: walking regularly   Diet: good water, fruits/vegetables daily    Social Determinants of Health   Financial Resource Strain: Low Risk  (07/26/2021)   Overall Financial Resource Strain (CARDIA)    Difficulty of Paying Living Expenses: Not hard at all  Food Insecurity: No Food Insecurity (07/26/2021)   Hunger Vital Sign    Worried About Running Out of Food in the Last Year: Never true    Ran Out of Food  in the Last Year: Never true  Transportation Needs: No Transportation Needs (07/26/2021)   PRAPARE - Hydrologist (Medical): No    Lack of Transportation (Non-Medical): No  Physical Activity: Insufficiently Active (07/26/2021)   Exercise Vital Sign    Days of Exercise per Week: 2 days    Minutes of Exercise per Session: 40 min  Stress: No Stress Concern Present (07/26/2021)   [place]    Feeling of Stress : Not at all  Social Connections: [place] (07/26/2021)   Social Connection  and Isolation Panel [NHANES]    Frequency of Communication with Friends and Family: More than three times a week    Frequency of Social Gatherings with Friends and Family: More than three times a week    Attends Religious Services: More than 4 times per year    Active Member of [employer] or Organizations: Yes    Attends Music therapist: More than 4 times per year    Marital Status: Married    Tobacco Counseling Counseling given: Not Answered   Clinical Intake:  Pre-visit preparation completed: Yes  Pain : No/denies pain     BMI - recorded: 24.84 Nutritional Status: BMI of 19-24  Normal Nutritional Risks: None Diabetes: Yes  How often do you need to have someone help you when you read instructions, pamphlets, or other written materials from your doctor or pharmacy?: 5 - Always  Diabetic?Nutrition Risk Assessment:  Has the patient had any N/V/D within the last 2 months?  No  Does the patient have any non-healing wounds?  No  Has the patient had any unintentional weight loss or weight gain?  No   Diabetes:  Is the patient diabetic?  Yes  If diabetic, was a CBG obtained today?  No  Did the patient bring in their glucometer from home?  No  How often do you monitor your CBG's? Dexcom.   Financial Strains and Diabetes Management:  Are you having any financial strains with the  device, your supplies or your medication? Yes .  Does the patient want to be seen by Chronic Care Management for management of their diabetes?  No  Would the patient like to be referred to a Nutritionist or for Diabetic Management?  No   Diabetic Exams:  Diabetic Eye Exam: Completed 2022. Pt has been advised about the importance in completing this exam. A referral has been placed today. Message sent to referral coordinator for scheduling purposes. Advised pt to expect a call from office referred to regarding appt.  Diabetic Foot Exam: Completed 06/21/2021. Pt has been advised about the importance in completing this exam.   Interpreter Needed?: No  Information entered by :: mj Lindaann Gradilla, lpn   Activities of Daily Living    07/26/2021   11:27 AM  In your present state of health, do you have any difficulty performing the following activities:  Hearing? 0  Vision? 0  Difficulty concentrating or making decisions? 0  Walking or climbing stairs? 0  Dressing or bathing? 0  Doing errands, shopping? 0  Preparing Food and eating ? N  Using the Toilet? N  In the past six months, have you accidently leaked urine? N  Do you have problems with loss of bowel control? N  Managing your Medications? N  Managing your Finances? N  Housekeeping or managing your Housekeeping? N    Patient Care Team: Ria Bush, MD as PCP - General (Family Medicine) Heath Lark, MD as Consulting Physician (Hematology and Oncology) Charlton Haws, [facility] as Pharmacist (Pharmacist)  Indicate any recent Medical Services you may have received from other than [facility] providers in the past year (date may be approximate).     Assessment:   This is a routine wellness examination for Ricky Garrett.  Hearing/Vision screen Hearing Screening - Comments:: Hearing aids.  Vision Screening - Comments:: Glasses, readers. Dr. Katy Fitch. 2022.  Dietary issues and exercise activities discussed: Current Exercise Habits: Home exercise  routine, Type of exercise: walking, Time (Minutes): 30, Frequency (Times/Week): 2, Weekly Exercise (Minutes/Week): 60, Intensity: Mild,  Exercise limited by: cardiac condition(s)   Goals Addressed             This Visit's Progress    Patient Stated   On track    07/15/2020, I will continue to walk 2 days a week for 30 minutes.        Depression Screen    07/26/2021   11:23 AM 07/15/2020    3:31 PM 12/23/2018    4:05 PM 10/31/2017    8:47 AM 02/23/2017    9:12 AM 07/04/2016    9:43 AM 07/02/2015    2:07 PM  PHQ 2/9 Scores  PHQ - 2 Score 0 0 0 0 0 0 0  PHQ- 9 Score  0         Fall Risk    07/26/2021   11:25 AM 07/15/2020    3:31 PM 12/23/2018    4:04 PM 12/20/2018   12:04 PM 10/31/2017    8:47 AM  Fall Risk   Falls in the past year? 0 0 0 0 No  Comment    Emmi Telephone Survey: data to providers prior to load   Number falls in past yr: 0 0     Injury with Fall? 0 0     Risk for fall due to : No Fall Risks Medication side effect     Follow up Falls prevention discussed Falls evaluation completed;Falls prevention discussed       FALL RISK PREVENTION PERTAINING TO THE HOME:  Any stairs in or around the home? Yes  If so, are there any without handrails? No  Home free of loose throw rugs in walkways, pet beds, electrical cords, etc? Yes  Adequate lighting in your home to reduce risk of falls? Yes   ASSISTIVE DEVICES UTILIZED TO PREVENT FALLS:  Life alert? No  Use of a cane, walker or w/c? No  Grab bars in the bathroom? No  Shower chair or bench in shower? No  Elevated toilet seat or a handicapped toilet? No   TIMED UP AND GO:  Was the test performed? No .  Phone visit.  Cognitive Function:    07/15/2020    3:46 PM  MMSE - Mini Mental State Exam  Not completed: Unable to complete        Immunizations Immunization History  Administered Date(s) Administered   Influenza Inj Mdck Quad Pf 10/30/2017   Influenza Split 11/14/2010   Influenza Whole 11/29/2007,  11/09/2008, 11/04/2010   Influenza, High Dose Seasonal PF 10/20/2014, 11/09/2015, 10/24/2018, 10/23/2019, 10/21/2020   Influenza,inj,Quad PF,6+ Mos 09/24/2012   Influenza-Unspecified 10/30/2013, 02/27/2016, 10/10/2016   PFIZER(Purple Top)SARS-COV-2 Vaccination 02/13/2019, 03/06/2019, 11/12/2019, 07/06/2020   Pneumococcal Conjugate-13 08/30/2018   Pneumococcal Polysaccharide-23 10/14/2009, 01/05/2016   Td 03/30/2005   Tdap 01/05/2016   Zoster Recombinat (Shingrix) 01/09/2019, 04/04/2019   Zoster, Live 02/23/2017    TDAP status: Up to date  Flu Vaccine status: Up to date  Pneumococcal vaccine status: Up to date  Covid-19 vaccine status: Completed vaccines  Qualifies for Shingles Vaccine? Yes   Zostavax completed Yes   Shingrix Completed?: Yes  Screening Tests Health Maintenance  Topic Date Due   COVID-19 Vaccine (5 - Booster for Pfizer series) 08/11/2021 (Originally 08/31/2020)   INFLUENZA VACCINE  08/30/2021   OPHTHALMOLOGY EXAM  09/27/2021   HEMOGLOBIN A1C  12/22/2021   FOOT EXAM  06/22/2022   COLONOSCOPY (Pts 45-35yr Insurance coverage will need to be confirmed)  08/21/2022   TETANUS/TDAP  01/04/2026   Pneumonia Vaccine 65+ Years  old  Completed   Zoster Vaccines- Shingrix  Completed   HPV VACCINES  Aged Out    Health Maintenance  There are no preventive care reminders to display for this patient.   Colorectal cancer screening: Type of screening: Colonoscopy. Completed 08/21/2019. Repeat every 3 years  Lung Cancer Screening: (Low Dose CT Chest recommended if Age 15-80 years, 30 pack-year currently smoking OR have quit w/in 15years.) does not qualify.    Additional Screening:  Hepatitis C Screening: does not qualify;   Vision Screening: Recommended annual ophthalmology exams for early detection of glaucoma and other disorders of the eye. Is the patient up to date with their annual eye exam?  Yes  Who is the provider or what is the name of the office in which the  patient attends annual eye exams? Dr. Warden Fillers If pt is not established with a provider, would they like to be referred to a provider to establish care? No .   Dental Screening: Recommended annual dental exams for proper oral hygiene  Community Resource Referral / Chronic Care Management: CRR required this visit?  No   CCM required this visit?  No      Plan:     I have personally reviewed and noted the following in the patient's chart:   Medical and social history Use of alcohol, tobacco or illicit drugs  Current medications and supplements including opioid prescriptions. Patient is not currently taking opioid prescriptions. Functional ability and status Nutritional status Physical activity Advanced directives List of other physicians Hospitalizations, surgeries, and ER visits in previous 12 months Vitals Screenings to include cognitive, depression, and falls Referrals and appointments  In addition, I have reviewed and discussed with patient certain preventive protocols, quality metrics, and best practice recommendations. A written personalized care plan for preventive services as well as general preventive health recommendations were provided to patient.     Chriss Driver, LPN   6/64/4034   Nurse Notes:  Pt is up to date on all age appropriate vaccines and health maintenance.

## 2021-07-29 DIAGNOSIS — E1159 Type 2 diabetes mellitus with other circulatory complications: Secondary | ICD-10-CM | POA: Diagnosis not present

## 2021-07-29 DIAGNOSIS — E785 Hyperlipidemia, unspecified: Secondary | ICD-10-CM

## 2021-07-29 DIAGNOSIS — I10 Essential (primary) hypertension: Secondary | ICD-10-CM | POA: Diagnosis not present

## 2021-07-29 DIAGNOSIS — Z7984 Long term (current) use of oral hypoglycemic drugs: Secondary | ICD-10-CM | POA: Diagnosis not present

## 2021-08-04 ENCOUNTER — Telehealth: Payer: Self-pay

## 2021-08-04 NOTE — Chronic Care Management (AMB) (Signed)
    Chronic Care Management Pharmacy Assistant   Name: Ricky Garrett  MRN: [id number] DOB: [birth year]  Reason for Encounter: Reminder Call   Conditions to be addressed/monitored: HTN, HLD, and DMII   Medications: Outpatient Encounter Medications as of 08/04/2021  Medication Sig Note   Accu-Chek Softclix Lancets lancets Use as instructed to check sugars twice daily E11.69    aspirin EC 81 MG tablet Take 1 tablet (81 mg total) by mouth every Monday, Wednesday, and Friday.    Continuous Blood Gluc Sensor (FREESTYLE LIBRE 3 SENSOR) MISC Place 1 sensor on the skin every 14 days. Use to check glucose continuously    empagliflozin (JARDIANCE) 10 MG TABS tablet Take 1 tablet (10 mg total) by mouth every other day. With breakfast 07/26/2021: Takes M, W and Fri.   famotidine (PEPCID) 20 MG tablet Take 1 tablet (20 mg total) by mouth daily.    glimepiride (AMARYL) 1 MG tablet  (Patient not taking: Reported on 07/26/2021)    glucose blood (ACCU-CHEK AVIVA PLUS) test strip Use as instructed to check sugars twice daily E11.69    lisinopril-hydrochlorothiazide (ZESTORETIC) 20-25 MG tablet Take 1 tablet by mouth daily.    lovastatin (MEVACOR) 20 MG tablet Take 1 tablet nightly for cholesterol    metFORMIN (GLUCOPHAGE-XR) 500 MG 24 hr tablet Take 2 tablets (1,000 mg total) by mouth daily with breakfast.    Multiple Vitamins-Minerals (CENTRUM SILVER) tablet Take 1 tablet by mouth daily.    omeprazole (PRILOSEC) 40 MG capsule Take by mouth.    No facility-administered encounter medications on file as of 08/04/2021.    Ricky Garrett was contacted to remind of upcoming telephone visit with Charlene Brooke  on 08/09/21 at 1:30pm. Patient was reminded to have any blood glucose and blood pressure readings available for review at appointment.   Patient confirmed appointment.  Spoke with [name] (daughter)   Are you having any problems with your medications? No   Do you have any concerns you like to discuss with  the pharmacist?  Patient has been on a trial with CGM    CCM referral has been placed prior to visit?  Yes    Star Rating Drugs: Medication:   Last Fill: Day Supply Jardiance '10mg'$   07/29/21 90 Glimepiride '1mg'$   06/29/21 90 Lisinopril/hctz 20-'25mg'$  07/21/21 90 Lovastatin '20mg'$   07/21/21 90 Metformin XR '500mg'$   07/21/21 [place], CPP notified  Avel Sensor, [place]  [place], CPP notified  Avel Sensor, [place]  [phone]

## 2021-08-09 ENCOUNTER — Ambulatory Visit (INDEPENDENT_AMBULATORY_CARE_PROVIDER_SITE_OTHER): Payer: Medicare Other | Admitting: Pharmacist

## 2021-08-09 DIAGNOSIS — E1169 Type 2 diabetes mellitus with other specified complication: Secondary | ICD-10-CM

## 2021-08-09 DIAGNOSIS — E1159 Type 2 diabetes mellitus with other circulatory complications: Secondary | ICD-10-CM

## 2021-08-09 NOTE — Patient Instructions (Addendum)
Visit Information  Phone number for Pharmacist: [phone]   Goals Addressed   None     Care Plan : [place]  Updates made by Charlton Haws, RPH since 08/09/2021 12:00 AM     Problem: Hypertension, Hyperlipidemia, and Diabetes   Priority: High     Long-Range Goal: Disease mgmt   Start Date: 07/01/2021  Expected End Date: 07/02/2022  This Visit's Progress: On track  Recent Progress: On track  Priority: High  Note:   Current Barriers:  None identified  Pharmacist Clinical Goal(s):  Patient will contact provider office for questions/concerns as evidenced notation of same in electronic health record through collaboration with PharmD and provider.   Interventions: 1:1 collaboration with Ria Bush, MD regarding development and update of comprehensive plan of care as evidenced by provider attestation and co-signature Inter-disciplinary care team collaboration (see longitudinal plan of care) Comprehensive medication review performed; medication list updated in electronic medical record  Hypertension (BP goal <130/80) -Controlled - BP at goal in recent clinic visits -Current treatment: Lisinopril/HCTZ 20-25 mg daily - Appropriate, Effective, Safe, Accessible -Medications previously tried: n/a  -Educated on BP goals and benefits of medications for prevention of heart attack, stroke and kidney damage; -Counseled to monitor BP at home periodically -Recommended to continue current medication  Hyperlipidemia: (LDL goal < 100) -Controlled - LDL 81 (03/2021) at goal -Current treatment: Lovastatin 20 mg daily -Appropriate, Effective, Safe, Accessible Aspirin 81 mg daily -Appropriate, Effective, Safe, Accessible -Medications previously tried: fenofibrate  -Educated on Cholesterol goals;  -Recommended to continue current medication  Diabetes (A1c goal <7%) -Controlled - A1c 6.3% (05/2021) at goal, pt recently started using CGM -Home BG readings: wearing  Freestyle Libre 3 (pays OOP - $70 per sensor) -Reviewed AGP report: 07/27/21 to 08/09/21. Sensor active: 84%  Time in range (70-180): 79% (goal > 70%)  High (>180): 21%  Low (< 70): 0% (goal < 4%)  GMI: 7.1%; Average glucose: 159  -Current medications: Jardiance 10 mg QOD - Appropriate, Effective, Safe, Accessible Metformin XR 500 mg - 2 tab daily -Appropriate, Effective, Safe, Accessible Freestyle Libre 3 -Medications previously tried: glipizide  -Reviewed AGP report with patient - TIR is at goal > 70% and pt is not having any lows; highest BG in 200s after dinner mainly, discussed limiting carbs to smaller portions and drinking more water -Recommended to continue current medication  Patient Goals/Self-Care Activities Patient will:  - take medications as prescribed as evidenced by patient report and record review focus on medication adherence by routine check glucose using Freestyle Libre, document, and provide at future appointments       Patient verbalizes understanding of instructions and care plan provided today and agrees to view in [place]. Active MyChart status and patient understanding of how to access instructions and care plan via MyChart confirmed with patient.    The patient has been provided with contact information for the care management team and has been advised to call with any health related questions or concerns.   Charlene Brooke, PharmD, BCACP Clinical Pharmacist [facility] at [facility] [phone]

## 2021-08-09 NOTE — Progress Notes (Signed)
Chronic Care Management Pharmacy Note  08/09/2021 Name:  Ricky Garrett MRN:  [id number] DOB:  1940-07-08  Summary: CCM F/U visit -Reviewed Freestyle Libre data: Time in range 79% (goal > 70%), highest sugars in low 200s after dinner; discussed limiting carbs at dinner and drinking more water  Recommendations: -No changes  Plan: -The patient has been provided with contact information for the care management team and has been advised to call with any health related questions or concerns.  -PCP F/U 09/23/21   Subjective: Ricky Garrett is an 81 y.o. year old male who is a primary patient of Ria Bush, MD.  The CCM team was consulted for assistance with disease management and care coordination needs.    Engaged with patient by telephone for follow up visit in response to provider referral for pharmacy case management and/or care coordination services.   Consent to Services:  The patient was given information about Chronic Care Management services, agreed to services, and gave verbal consent prior to initiation of services.  Please see initial visit note for detailed documentation.   Patient Care Team: Ria Bush, MD as PCP - General (Family Medicine) Heath Lark, MD as Consulting Physician (Hematology and Oncology) Charlton Haws, [facility] as Pharmacist (Pharmacist)  Recent office visits: 06/21/21 PCP OV: f/u - trial off terazosin due to lack of benefit. Reduce Jardiance to MWF. Trial off fibrate. Continue lovastatin.  Recent consult visits: [facility] visits: N/a   Objective:  Lab Results  Component Value Date   CREATININE 1.16 03/08/2021   BUN 25 (H) 03/08/2021   GFR 59.34 (L) 03/08/2021   GFRNONAA 69 10/31/2017   GFRAA 80 10/31/2017   NA 138 03/08/2021   K 4.3 03/08/2021   CALCIUM 10.5 03/08/2021   CO2 35 (H) 03/08/2021   GLUCOSE 176 (H) 03/08/2021    Lab Results  Component Value Date/Time   HGBA1C 6.3 (A) 06/21/2021 09:27 AM   HGBA1C 9.1 (A)  03/08/2021 09:18 AM   HGBA1C 7.6 (H) 08/18/2020 09:29 AM   HGBA1C 5.9 (H) 11/03/2009 10:58 PM   GFR 59.34 (L) 03/08/2021 10:12 AM   GFR 56.07 (L) 08/18/2020 09:29 AM    Last diabetic Eye exam:  Lab Results  Component Value Date/Time   HMDIABEYEEXA No Retinopathy 09/27/2020 12:00 AM    Last diabetic Foot exam: No results found for: "HMDIABFOOTEX"   Lab Results  Component Value Date   CHOL 158 03/08/2021   HDL 54.20 03/08/2021   LDLCALC 81 03/08/2021   LDLDIRECT 77 06/17/2012   TRIG 113.0 03/08/2021   CHOLHDL 3 03/08/2021       Latest Ref Rng & Units 03/08/2021   10:12 AM 08/18/2020    9:29 AM 12/23/2018    5:14 PM  Hepatic Function  Total Protein 6.0 - 8.3 g/dL  6.5  7.3   Albumin 3.5 - 5.2 g/dL 4.8  4.3  4.4   AST 0 - 37 U/L  23  30   ALT 0 - 53 U/L  19  26   Alk Phosphatase 39 - 117 U/L  32  32   Total Bilirubin 0.2 - 1.2 mg/dL  0.6  0.4     Lab Results  Component Value Date/Time   TSH 3.432 03/18/2009 09:13 PM       Latest Ref Rng & Units 03/08/2021   10:12 AM 08/18/2020    9:29 AM 06/24/2019   11:07 AM  CBC  WBC 4.0 - 10.5 K/uL 5.2  4.6  4.9  Hemoglobin 13.0 - 17.0 g/dL 14.4  13.8  13.9   Hematocrit 39.0 - 52.0 % 44.0  40.5  41.5   Platelets 150.0 - 400.0 K/uL 133.0  126.0  127.0     No results found for: "VD25OH"  Clinical ASCVD: No  The ASCVD Risk score (Arnett DK, et al., 2019) failed to calculate for the following reasons:   The 2019 ASCVD risk score is only valid for ages 56 to 7       07/26/2021   11:23 AM 07/15/2020    3:31 PM 12/23/2018    4:05 PM  Depression screen PHQ 2/9  Decreased Interest 0 0 0  Down, Depressed, Hopeless 0 0 0  PHQ - 2 Score 0 0 0  Altered sleeping  0   Tired, decreased energy  0   Change in appetite  0   Feeling bad or failure about yourself   0   Trouble concentrating  0   Moving slowly or fidgety/restless  0   Suicidal thoughts  0   PHQ-9 Score  0   Difficult doing work/chores  Not difficult at all       Social History   Tobacco Use  Smoking Status Former   Packs/day: 0.50   Years: 1.00   Total pack years: 0.50   Types: Cigarettes   Quit date: 1990   Years since quitting: 33.5  Smokeless Tobacco Never   BP Readings from Last 3 Encounters:  06/21/21 114/62  03/08/21 134/70  08/25/20 120/70   Pulse Readings from Last 3 Encounters:  06/21/21 (!) 54  03/08/21 64  08/25/20 73   Wt Readings from Last 3 Encounters:  07/26/21 147 lb (66.7 kg)  06/21/21 147 lb 2 oz (66.7 kg)  03/08/21 154 lb 2 oz (69.9 kg)   BMI Readings from Last 3 Encounters:  07/26/21 24.84 kg/m  06/21/21 24.86 kg/m  03/08/21 26.05 kg/m    Assessment/Interventions: Review of patient past medical history, allergies, medications, health status, including review of consultants reports, laboratory and other test data, was performed as part of comprehensive evaluation and provision of chronic care management services.   SDOH:  (Social Determinants of Health) assessments and interventions performed: Yes   SDOH Screenings   Alcohol Screen: Low Risk  (07/26/2021)   Alcohol Screen    Last Alcohol Screening Score (AUDIT): 1  Depression (PHQ2-9): Low Risk  (07/26/2021)   Depression (PHQ2-9)    PHQ-2 Score: 0  Financial Resource Strain: Low Risk  (07/26/2021)   Overall Financial Resource Strain (CARDIA)    Difficulty of Paying Living Expenses: Not hard at all  Food Insecurity: No Food Insecurity (07/26/2021)   Hunger Vital Sign    Worried About Running Out of Food in the Last Year: Never true    [place] in the Last Year: Never true  Housing: Low Risk  (07/26/2021)   Housing    Last Housing Risk Score: 0  Physical Activity: Insufficiently Active (07/26/2021)   Exercise Vital Sign    Days of Exercise per Week: 2 days    Minutes of Exercise per Session: 40 min  Social Connections: Socially Integrated (07/26/2021)   Social Connection and Isolation Panel [NHANES]    Frequency of Communication with  Friends and Family: More than three times a week    Frequency of Social Gatherings with Friends and Family: More than three times a week    Attends Religious Services: More than 4 times per year    Active  Member of Clubs or Organizations: Yes    Attends Archivist Meetings: More than 4 times per year    Marital Status: Married  Stress: No Stress Concern Present (07/26/2021)   [place]    Feeling of Stress : Not at all  Tobacco Use: Medium Risk (07/26/2021)   Patient History    Smoking Tobacco Use: Former    Smokeless Tobacco Use: Never    Passive Exposure: Not on file  Transportation Needs: No Transportation Needs (07/26/2021)   PRAPARE - Hydrologist (Medical): No    Lack of Transportation (Non-Medical): No    CCM Care Plan  No Known Allergies  Medications Reviewed Today     Reviewed by Charlton Haws, [facility] (Pharmacist) on 08/09/21 at 1358  Med List Status: <None>   Medication Order Taking? Sig Documenting Provider Last Dose Status Informant  Accu-Chek Softclix Lancets lancets [id number] Yes Use as instructed to check sugars twice daily E11.69 Ria Bush, MD Taking Active   aspirin EC 81 MG tablet [id number] Yes Take 1 tablet (81 mg total) by mouth every Monday, Wednesday, and Friday. Ria Bush, MD Taking Active   Continuous Blood Gluc Sensor (FREESTYLE LIBRE 3 SENSOR) [place] [id number] Yes Place 1 sensor on the skin every 14 days. Use to check glucose continuously Ria Bush, MD Taking Active   empagliflozin (JARDIANCE) 10 MG TABS tablet [id number] Yes Take 1 tablet (10 mg total) by mouth every other day. With breakfast Ria Bush, MD Taking Active            Med Note Langston Masker, [place] Jul 26, 2021 11:22 AM) Waymond Cera, W and Fri.  famotidine (PEPCID) 20 MG tablet [id number] Yes Take 1 tablet (20 mg total) by mouth daily. Ria Bush, MD  Taking Active   glimepiride (AMARYL) 1 MG tablet [id number] Yes  [provider] Taking Active   glucose blood (ACCU-CHEK AVIVA PLUS) test strip [id number] Yes Use as instructed to check sugars twice daily E11.69 Ria Bush, MD Taking Active   lisinopril-hydrochlorothiazide (ZESTORETIC) 20-25 MG tablet [id number] Yes Take 1 tablet by mouth daily. Ria Bush, MD Taking Active   lovastatin (MEVACOR) 20 MG tablet [id number] Yes Take 1 tablet nightly for cholesterol Ria Bush, MD Taking Active   metFORMIN (GLUCOPHAGE-XR) 500 MG 24 hr tablet [id number] Yes Take 2 tablets (1,000 mg total) by mouth daily with breakfast. Ria Bush, MD Taking Active   Multiple Vitamins-Minerals (CENTRUM SILVER) tablet [id number] Yes Take 1 tablet by mouth daily. Clent Demark, PA-C Taking Active   omeprazole (PRILOSEC) 40 MG capsule [id number] Yes Take by mouth. [provider] Taking Active             Patient Active Problem List   Diagnosis Date Noted   Decreased hearing of both ears 11/18/2019   Irregular heart beat 11/18/2019   Medicare annual wellness visit, subsequent 12/23/2018   Health maintenance examination 12/23/2018   Advanced care planning/counseling discussion 12/23/2018   Hypertriglyceridemia 02/26/2017   Left external ear skin cancer status post resection 09/16/2015   BPH (benign prostatic hyperplasia) 01/06/2014   Tubular adenoma of colon 09/12/2013   Type 2 diabetes mellitus with other specified complication ([place]) 93/71/6967   Insomnia 03/26/2012   GERD (gastroesophageal reflux disease) 09/13/2011   Thrombocytopenia ([place]) 12/30/2009   Dyslipidemia associated with type 2 diabetes mellitus ([place]) 06/13/2007   ALLERGIC RHINITIS 06/13/2007   Hypertension associated with  diabetes ([place]) 09/28/2006    Immunization History  Administered Date(s) Administered   Influenza Inj Mdck Quad Pf 10/30/2017   Influenza Split 11/14/2010   Influenza Whole  11/29/2007, 11/09/2008, 11/04/2010   Influenza, High Dose Seasonal PF 10/20/2014, 11/09/2015, 10/24/2018, 10/23/2019, 10/21/2020   Influenza,inj,Quad PF,6+ Mos 09/24/2012   Influenza-Unspecified 10/30/2013, 02/27/2016, 10/10/2016   PFIZER(Purple Top)SARS-COV-2 Vaccination 02/13/2019, 03/06/2019, 11/12/2019, 07/06/2020   Pneumococcal Conjugate-13 08/30/2018   Pneumococcal Polysaccharide-23 10/14/2009, 01/05/2016   Td 03/30/2005   Tdap 01/05/2016   Zoster Recombinat (Shingrix) 01/09/2019, 04/04/2019   Zoster, Live 02/23/2017    Conditions to be addressed/monitored:  Hypertension, Hyperlipidemia, and Diabetes  Care Plan : [place]  Updates made by Charlton Haws, [place] since 08/09/2021 12:00 AM     Problem: Hypertension, Hyperlipidemia, and Diabetes   Priority: High     Long-Range Goal: Disease mgmt   Start Date: 07/01/2021  Expected End Date: 07/02/2022  This Visit's Progress: On track  Recent Progress: On track  Priority: High  Note:   Current Barriers:  None identified  Pharmacist Clinical Goal(s):  Patient will contact provider office for questions/concerns as evidenced notation of same in electronic health record through collaboration with PharmD and provider.   Interventions: 1:1 collaboration with Ria Bush, MD regarding development and update of comprehensive plan of care as evidenced by provider attestation and co-signature Inter-disciplinary care team collaboration (see longitudinal plan of care) Comprehensive medication review performed; medication list updated in electronic medical record  Hypertension (BP goal <130/80) -Controlled - BP at goal in recent clinic visits -Current treatment: Lisinopril/HCTZ 20-25 mg daily - Appropriate, Effective, Safe, Accessible -Medications previously tried: n/a  -Educated on BP goals and benefits of medications for prevention of heart attack, stroke and kidney damage; -Counseled to monitor BP at home  periodically -Recommended to continue current medication  Hyperlipidemia: (LDL goal < 100) -Controlled - LDL 81 (03/2021) at goal -Current treatment: Lovastatin 20 mg daily -Appropriate, Effective, Safe, Accessible Aspirin 81 mg daily -Appropriate, Effective, Safe, Accessible -Medications previously tried: fenofibrate  -Educated on Cholesterol goals;  -Recommended to continue current medication  Diabetes (A1c goal <7%) -Controlled - A1c 6.3% (05/2021) at goal, pt recently started using CGM -Home BG readings: wearing Freestyle Libre 3 (pays OOP - $70 per sensor) -Reviewed AGP report: 07/27/21 to 08/09/21. Sensor active: 84%  Time in range (70-180): 79% (goal > 70%)  High (>180): 21%  Low (< 70): 0% (goal < 4%)  GMI: 7.1%; Average glucose: 159  -Current medications: Jardiance 10 mg QOD - Appropriate, Effective, Safe, Accessible Metformin XR 500 mg - 2 tab daily -Appropriate, Effective, Safe, Accessible Freestyle Libre 3 -Medications previously tried: glipizide  -Reviewed AGP report with patient - TIR is at goal > 70% and pt is not having any lows; highest BG in 200s after dinner mainly, discussed limiting carbs to smaller portions and drinking more water -Recommended to continue current medication  Patient Goals/Self-Care Activities Patient will:  - take medications as prescribed as evidenced by patient report and record review focus on medication adherence by routine check glucose using Freestyle Libre, document, and provide at future appointments        Medication Assistance: None required.  Patient affirms current coverage meets needs.  Compliance/Adherence/Medication fill history: Care Gaps: None  Star-Rating Drugs: Jardiance - PDC 100% Metformin - PDC 100% Lisinopril/HCTZ - PDC 100% Lovastatin - PDC 100%  Medication Access: Within the past 30 days, how often has patient missed a dose of medication? 0 Is  a pillbox or other method used to improve adherence? No   Factors that may affect medication adherence? no barriers identified Are meds synced by current pharmacy? No  Are meds delivered by current pharmacy? No  Does patient experience delays in picking up medications due to transportation concerns? No   [facility] Services Reviewed: Is patient disadvantaged to use [facility]?: No  Current Rx insurance plan: U[employer] Dual complete Name and location of Current pharmacy:  [id number] [place] [place] [id number]: [phone]: [phone] [facility] services reviewed with patient today?: No   Care Plan and Follow Up Patient Decision:  Patient agrees to Care Plan and Follow-up.  Plan: The patient has been provided with contact information for the care management team and has been advised to call with any health related questions or concerns.   LCharlene Brooke PharmD, BCACP Clinical Pharmacist LAnthonyPrimary Care at [phone]

## 2021-08-22 ENCOUNTER — Encounter: Payer: Medicare Other | Admitting: Family Medicine

## 2021-08-29 DIAGNOSIS — I152 Hypertension secondary to endocrine disorders: Secondary | ICD-10-CM

## 2021-08-29 DIAGNOSIS — E785 Hyperlipidemia, unspecified: Secondary | ICD-10-CM

## 2021-08-29 DIAGNOSIS — E1159 Type 2 diabetes mellitus with other circulatory complications: Secondary | ICD-10-CM

## 2021-08-29 DIAGNOSIS — E1169 Type 2 diabetes mellitus with other specified complication: Secondary | ICD-10-CM | POA: Diagnosis not present

## 2021-09-23 ENCOUNTER — Encounter: Payer: Self-pay | Admitting: Family Medicine

## 2021-09-23 ENCOUNTER — Ambulatory Visit (INDEPENDENT_AMBULATORY_CARE_PROVIDER_SITE_OTHER): Payer: Medicare Other | Admitting: Family Medicine

## 2021-09-23 VITALS — BP 122/70 | HR 93 | Temp 97.5°F | Ht 64.5 in | Wt 144.1 lb

## 2021-09-23 DIAGNOSIS — R1310 Dysphagia, unspecified: Secondary | ICD-10-CM

## 2021-09-23 DIAGNOSIS — E1169 Type 2 diabetes mellitus with other specified complication: Secondary | ICD-10-CM | POA: Diagnosis not present

## 2021-09-23 DIAGNOSIS — Z7189 Other specified counseling: Secondary | ICD-10-CM | POA: Diagnosis not present

## 2021-09-23 DIAGNOSIS — K219 Gastro-esophageal reflux disease without esophagitis: Secondary | ICD-10-CM

## 2021-09-23 DIAGNOSIS — E781 Pure hyperglyceridemia: Secondary | ICD-10-CM | POA: Diagnosis not present

## 2021-09-23 DIAGNOSIS — Z0001 Encounter for general adult medical examination with abnormal findings: Secondary | ICD-10-CM | POA: Diagnosis not present

## 2021-09-23 DIAGNOSIS — D126 Benign neoplasm of colon, unspecified: Secondary | ICD-10-CM

## 2021-09-23 DIAGNOSIS — R634 Abnormal weight loss: Secondary | ICD-10-CM | POA: Diagnosis not present

## 2021-09-23 DIAGNOSIS — Z0000 Encounter for general adult medical examination without abnormal findings: Secondary | ICD-10-CM

## 2021-09-23 DIAGNOSIS — K921 Melena: Secondary | ICD-10-CM | POA: Diagnosis not present

## 2021-09-23 DIAGNOSIS — R197 Diarrhea, unspecified: Secondary | ICD-10-CM

## 2021-09-23 DIAGNOSIS — N401 Enlarged prostate with lower urinary tract symptoms: Secondary | ICD-10-CM

## 2021-09-23 DIAGNOSIS — E1159 Type 2 diabetes mellitus with other circulatory complications: Secondary | ICD-10-CM | POA: Diagnosis not present

## 2021-09-23 DIAGNOSIS — D696 Thrombocytopenia, unspecified: Secondary | ICD-10-CM

## 2021-09-23 DIAGNOSIS — I10 Essential (primary) hypertension: Secondary | ICD-10-CM

## 2021-09-23 DIAGNOSIS — R109 Unspecified abdominal pain: Secondary | ICD-10-CM

## 2021-09-23 DIAGNOSIS — R351 Nocturia: Secondary | ICD-10-CM

## 2021-09-23 DIAGNOSIS — I499 Cardiac arrhythmia, unspecified: Secondary | ICD-10-CM

## 2021-09-23 DIAGNOSIS — E785 Hyperlipidemia, unspecified: Secondary | ICD-10-CM

## 2021-09-23 LAB — COMPREHENSIVE METABOLIC PANEL
ALT: 22 U/L (ref 0–53)
AST: 22 U/L (ref 0–37)
Albumin: 4.5 g/dL (ref 3.5–5.2)
Alkaline Phosphatase: 59 U/L (ref 39–117)
BUN: 21 mg/dL (ref 6–23)
CO2: 32 mEq/L (ref 19–32)
Calcium: 9.5 mg/dL (ref 8.4–10.5)
Chloride: 100 mEq/L (ref 96–112)
Creatinine, Ser: 0.93 mg/dL (ref 0.40–1.50)
GFR: 77.06 mL/min (ref >60.00)
Glucose, Bld: 100 mg/dL — ABNORMAL HIGH (ref 70–99)
Potassium: 4 mEq/L (ref 3.5–5.1)
Sodium: 134 mEq/L — ABNORMAL LOW (ref 135–145)
Total Bilirubin: 0.7 mg/dL (ref 0.2–1.2)
Total Protein: 7.5 g/dL (ref 6.0–8.3)

## 2021-09-23 LAB — CBC WITH DIFFERENTIAL/PLATELET
Basophils Absolute: 0 10*3/uL (ref 0.0–0.1)
Basophils Relative: 0.6 % (ref 0.0–3.0)
Eosinophils Absolute: 0.1 10*3/uL (ref 0.0–0.7)
Eosinophils Relative: 1.2 % (ref 0.0–5.0)
HCT: 43.1 % (ref 39.0–52.0)
Hemoglobin: 14.4 g/dL (ref 13.0–17.0)
Lymphocytes Relative: 18.9 % (ref 12.0–46.0)
Lymphs Abs: 0.9 10*3/uL (ref 0.7–4.0)
MCHC: 33.3 g/dL (ref 30.0–36.0)
MCV: 90 fl (ref 78.0–100.0)
Monocytes Absolute: 0.3 10*3/uL (ref 0.1–1.0)
Monocytes Relative: 7 % (ref 3.0–12.0)
Neutro Abs: 3.4 10*3/uL (ref 1.4–7.7)
Neutrophils Relative %: 72.3 % (ref 43.0–77.0)
Platelets: 103 10*3/uL — ABNORMAL LOW (ref 150.0–400.0)
RBC: 4.79 Mil/uL (ref 4.22–5.81)
RDW: 13.9 % (ref 11.5–15.5)
WBC: 4.7 10*3/uL (ref 4.0–10.5)

## 2021-09-23 LAB — HEMOGLOBIN A1C: Hgb A1c MFr Bld: 7 % — ABNORMAL HIGH (ref 4.6–6.5)

## 2021-09-23 LAB — TSH: TSH: 3.34 u[IU]/mL (ref 0.35–5.50)

## 2021-09-23 LAB — LIPID PANEL
Cholesterol: 151 mg/dL (ref 0–200)
HDL: 41.7 mg/dL (ref >39.00)
LDL Cholesterol: 83 mg/dL (ref 0–99)
NonHDL: 108.99
Total CHOL/HDL Ratio: 4
Triglycerides: 129 mg/dL (ref 0.0–149.0)
VLDL: 25.8 mg/dL (ref 0.0–40.0)

## 2021-09-23 LAB — MICROALBUMIN / CREATININE URINE RATIO
Creatinine,U: 49.2 mg/dL
Microalb Creat Ratio: 1.4 mg/g (ref 0.0–30.0)
Microalb, Ur: <0.7 mg/dL (ref 0.0–1.9)

## 2021-09-23 LAB — LIPASE: Lipase: 35 U/L (ref 11.0–59.0)

## 2021-09-23 MED ORDER — LISINOPRIL-HYDROCHLOROTHIAZIDE 20-25 MG PO TABS: 1.0000 | ORAL_TABLET | Freq: Every day | ORAL | 3 refills | Status: DC

## 2021-09-23 MED ORDER — METFORMIN HCL ER 500 MG PO TB24: 1000.0000 mg | ORAL_TABLET | Freq: Every day | ORAL | 3 refills | Status: DC

## 2021-09-23 MED ORDER — LOVASTATIN 20 MG PO TABS: ORAL_TABLET | ORAL | 3 refills | Status: DC

## 2021-09-23 NOTE — Assessment & Plan Note (Signed)
Preventative protocols reviewed and updated unless pt declined. Discussed healthy diet and lifestyle.  

## 2021-09-23 NOTE — Progress Notes (Unsigned)
Patient ID: Ricky Garrett, male    DOB: 07/23/1940, 81 y.o.   MRN: [id number]  This visit was conducted in person.  BP 122/70   Pulse 93   Temp (!) 97.5 F (36.4 C) (Temporal)   Ht 5' 4.5" (1.638 m)   Wt 144 lb 2 oz (65.4 kg)   SpO2 97%   BMI 24.36 kg/m    CC: CPE Subjective:   HPI: Ricky Garrett is a 81 y.o. male presenting on 09/23/2021 for Annual Exam [facility] prt 2.  C/o abd pain and diarrhea.  Sxs started 2 wks ago.  Denies nausea/vomiting. Pt accompanied by daughter, [name]. )   Saw health advisor 06/2021 for medicare wellness visit. Note reviewed.  Cognitive assessment not done.   No results found.  Flowsheet Row Clinical Support from 07/26/2021 in [place] [facility]  PHQ-2 Total Score 0          07/26/2021   11:25 AM 07/15/2020    3:31 PM 12/23/2018    4:04 PM 12/20/2018   12:04 PM 10/31/2017    8:47 AM  Fall Risk   Falls in the past year? 0 0 0 0 No  Comment    Emmi Telephone Survey: data to providers prior to load   Number falls in past yr: 0 0     Injury with Fall? 0 0     Risk for fall due to : No Fall Risks Medication side effect     Follow up Falls prevention discussed Falls evaluation completed;Falls prevention discussed      DM - jardiance '10mg'$  MWF, metformin XR '1000mg'$  daily.  Lab Results  Component Value Date   HGBA1C 7.0 (H) 09/23/2021   2 month h/o intermittent diarrhea, acutely worse over the past 2 weeks. Foul odor, black tarry. No bright red blood in stool. Watery and loose stools. No fevers. + abd pain - cramping.  No night sweats. No enlarged lymph nodes.  Ongoing weight loss and ongoing fatigue. Occasional intermittent dysphagia, odynophagia, and early satiety. Occasional heartburn - manages with omeprazole '40mg'$  daily.   Off fibrate and terazosin since last visit - doing well off these medicines.    Preventative: COLONOSCOPY 01/2014 - diverticulosis, 2 polyps, hemorrhoids, rpt 5 yrs (Outlaw)  He received screening virtual  colonoscopy 07/2019 through [facility]. Records reviewed - no polyps, 2.4cm hypodensity to R kidney likely cyst consider renal US.  Prostate cancer screening - known BPH on terazosin - last visit we stopped this - doing well even off medicine. Will age out of prostate cancer screening. Nocturia x2.  Lung cancer screening - not eligible  Flu shot - yearly [place] 01/2019, 03/2019, booster x2 10/2019, 06/2020  Tdap 2017 Pneumovax 2017, prevnar-13 2020 Zostavax - 01/2017 - may have received expired Shingrix - 12/2018, 03/2019  Advanced directive discussion - does not have set up. Unsure who would be HCPOA. Packet previously provided. They will continue working on this.  Seat belt use discussed Sunscreen use discussed. Would like moles on face checked Ex smoker - quit 1990  Alcohol  - rare  Dentist - yearly  Eye exam - yearly   Lives with daughter [name]) and her husband, 2 grand children, and wife Moved from [place] to the [place] (2008). Was living in [place] and moved to [place] first and then to [facility] (2008).  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 Cares for grandchildren. Edu - elementary Living in [place] with wife and daughter's family.  Activity: walking regularly Diet: good water, fruits/vegetables daily      Relevant past medical, surgical, family and social history reviewed and updated as indicated. Interim medical history since our last visit reviewed. Allergies and medications reviewed and updated. Outpatient Medications Prior to Visit  Medication Sig Dispense Refill   Accu-Chek Softclix Lancets lancets Use as instructed to check sugars twice daily E11.69 200 each 3   aspirin EC 81 MG tablet Take 1 tablet (81 mg total) by mouth every Monday, Wednesday, and Friday.     Continuous Blood Gluc Sensor (FREESTYLE LIBRE 3 SENSOR) MISC Place 1 sensor on the skin every 14 days. Use to check glucose continuously 2 each 3   empagliflozin (JARDIANCE) 10 MG TABS tablet  Take 1 tablet (10 mg total) by mouth every other day. With breakfast 45 tablet 1   famotidine (PEPCID) 20 MG tablet Take 1 tablet (20 mg total) by mouth daily. 90 tablet 3   glucose blood (ACCU-CHEK AVIVA PLUS) test strip Use as instructed to check sugars twice daily E11.69 200 strip 3   Multiple Vitamins-Minerals (CENTRUM SILVER) tablet Take 1 tablet by mouth daily.     glimepiride (AMARYL) 1 MG tablet      lisinopril-hydrochlorothiazide (ZESTORETIC) 20-25 MG tablet Take 1 tablet by mouth daily. 90 tablet 3   lovastatin (MEVACOR) 20 MG tablet Take 1 tablet nightly for cholesterol 90 tablet 3   metFORMIN (GLUCOPHAGE-XR) 500 MG 24 hr tablet Take 2 tablets (1,000 mg total) by mouth daily with breakfast. 180 tablet 3   omeprazole (PRILOSEC) 40 MG capsule Take by mouth.     No facility-administered medications prior to visit.     Per HPI unless specifically indicated in ROS section below Review of Systems  Constitutional:  Negative for activity change, appetite change, chills, fatigue, fever and unexpected weight change.  HENT:  Negative for hearing loss.   Eyes:  Negative for visual disturbance.  Respiratory:  Negative for cough, chest tightness, shortness of breath and wheezing.   Cardiovascular:  Negative for chest pain, palpitations and leg swelling.  Gastrointestinal:  Positive for abdominal pain and diarrhea. Negative for abdominal distention, blood in stool, constipation, nausea and vomiting.  Genitourinary:  Negative for difficulty urinating and hematuria.  Musculoskeletal:  Negative for arthralgias, myalgias and neck pain.       Cramping to legs  Skin:  Negative for rash.  Neurological:  Positive for light-headedness. Negative for dizziness, seizures, syncope and headaches.  Hematological:  Negative for adenopathy. Bruises/bleeds easily.  Psychiatric/Behavioral:  Negative for dysphoric mood. The patient is not nervous/anxious.     Objective:  BP 122/70   Pulse 93   Temp (!) 97.5  F (36.4 C) (Temporal)   Ht 5' 4.5" (1.638 m)   Wt 144 lb 2 oz (65.4 kg)   SpO2 97%   BMI 24.36 kg/m   Wt Readings from Last 3 Encounters:  09/23/21 144 lb 2 oz (65.4 kg)  07/26/21 147 lb (66.7 kg)  06/21/21 147 lb 2 oz (66.7 kg)      Physical Exam Vitals and nursing note reviewed.  Constitutional:      General: He is not in acute distress.    Appearance: Normal appearance. He is well-developed. He is not ill-appearing.  HENT:     Head: Normocephalic and atraumatic.     Right Ear: Hearing, tympanic membrane, ear canal and external ear normal.     Left Ear: Hearing, tympanic membrane, ear canal and external ear normal.  Eyes:  General: No scleral icterus.    Extraocular Movements: Extraocular movements intact.     Conjunctiva/sclera: Conjunctivae normal.     Pupils: Pupils are equal, round, and reactive to light.  Neck:     Thyroid: No thyroid mass or thyromegaly.     Vascular: No carotid bruit.  Cardiovascular:     Rate and Rhythm: Normal rate. Rhythm irregular.     Pulses: Normal pulses.          Radial pulses are 2+ on the right side and 2+ on the left side.     Heart sounds: Normal heart sounds. No murmur heard. Pulmonary:     Effort: Pulmonary effort is normal. No respiratory distress.     Breath sounds: Normal breath sounds. No wheezing, rhonchi or rales.  Abdominal:     General: Bowel sounds are normal. There is no distension.     Palpations: Abdomen is soft. There is no mass.     Tenderness: There is abdominal tenderness (mild) in the right lower quadrant and epigastric area. There is no guarding or rebound. Negative signs include Murphy's sign.     Hernia: No hernia is present.  Musculoskeletal:        General: Normal range of motion.     Cervical back: Normal range of motion and neck supple.     Right lower leg: No edema.     Left lower leg: No edema.  Lymphadenopathy:     Cervical: No cervical adenopathy.  Skin:    General: Skin is warm and dry.      Findings: No rash.  Neurological:     General: No focal deficit present.     Mental Status: He is alert and oriented to person, place, and time.  Psychiatric:        Mood and Affect: Mood normal.        Behavior: Behavior normal.        Thought Content: Thought content normal.        Judgment: Judgment normal.       Results for orders placed or performed in visit on 09/23/21  Comprehensive metabolic panel  Result Value Ref Range   Sodium 134 (L) 135 - 145 mEq/L   Potassium 4.0 3.5 - 5.1 mEq/L   Chloride 100 96 - 112 mEq/L   CO2 32 19 - 32 mEq/L   Glucose, Bld 100 (H) 70 - 99 mg/dL   BUN 21 6 - 23 mg/dL   Creatinine, Ser 0.93 0.40 - 1.50 mg/dL   Total Bilirubin 0.7 0.2 - 1.2 mg/dL   Alkaline Phosphatase 59 39 - 117 U/L   AST 22 0 - 37 U/L   ALT 22 0 - 53 U/L   Total Protein 7.5 6.0 - 8.3 g/dL   Albumin 4.5 3.5 - 5.2 g/dL   GFR 77.06 >60.00 mL/min   Calcium 9.5 8.4 - 10.5 mg/dL  Lipid panel  Result Value Ref Range   Cholesterol 151 0 - 200 mg/dL   Triglycerides 129.0 0.0 - 149.0 mg/dL   HDL 41.70 >39.00 mg/dL   VLDL 25.8 0.0 - 40.0 mg/dL   LDL Cholesterol 83 0 - 99 mg/dL   Total CHOL/HDL Ratio 4    NonHDL 108.99   TSH  Result Value Ref Range   TSH 3.34 0.35 - 5.50 uIU/mL  Hemoglobin A1c  Result Value Ref Range   Hgb A1c MFr Bld 7.0 (H) 4.6 - 6.5 %  CBC with Differential/Platelet  Result Value Ref Range  WBC 4.7 4.0 - 10.5 K/uL   RBC 4.79 4.22 - 5.81 Mil/uL   Hemoglobin 14.4 13.0 - 17.0 g/dL   HCT 43.1 39.0 - 52.0 %   MCV 90.0 78.0 - 100.0 fl   MCHC 33.3 30.0 - 36.0 g/dL   RDW 13.9 11.5 - 15.5 %   Platelets 103.0 (L) 150.0 - 400.0 K/uL   Neutrophils Relative % 72.3 43.0 - 77.0 %   Lymphocytes Relative 18.9 12.0 - 46.0 %   Monocytes Relative 7.0 3.0 - 12.0 %   Eosinophils Relative 1.2 0.0 - 5.0 %   Basophils Relative 0.6 0.0 - 3.0 %   Neutro Abs 3.4 1.4 - 7.7 K/uL   Lymphs Abs 0.9 0.7 - 4.0 K/uL   Monocytes Absolute 0.3 0.1 - 1.0 K/uL   Eosinophils Absolute  0.1 0.0 - 0.7 K/uL   Basophils Absolute 0.0 0.0 - 0.1 K/uL  Microalbumin / creatinine urine ratio  Result Value Ref Range   Microalb, Ur <0.7 0.0 - 1.9 mg/dL   Creatinine,U 49.2 mg/dL   Microalb Creat Ratio 1.4 0.0 - 30.0 mg/g  Lipase  Result Value Ref Range   Lipase 35.0 11.0 - 59.0 U/L   EKG - NSR rate 60s with rate variation, normal axis, intervals, no hypertrophy or acute ST/T changes  Assessment & Plan:   Problem List Items Addressed This Visit     Encounter for general adult medical examination with abnormal findings - Primary (Chronic)    Preventative protocols reviewed and updated unless pt declined. Discussed healthy diet and lifestyle.       Advanced care planning/counseling discussion (Chronic)    Advanced directive discussion - does not have set up. Unsure who would be HCPOA. Packet previously provided. They will continue working on this.       Dyslipidemia associated with type 2 diabetes mellitus ([place])    Stable period only on lovastatin - continue off fibrate. The ASCVD Risk score (Arnett DK, et al., 2019) failed to calculate for the following reasons:   The 2019 ASCVD risk score is only valid for ages 40 to 50       Relevant Medications   lisinopril-hydrochlorothiazide (ZESTORETIC) 20-25 MG tablet   lovastatin (MEVACOR) 20 MG tablet   metFORMIN (GLUCOPHAGE-XR) 500 MG 24 hr tablet   Thrombocytopenia (HCC)    Chronic, stable, previously saw hematology thought ITP.  Will hold aspirin for now pending GI eval.       Hypertension associated with diabetes ([place])    Chronic, stable on lisinopril/hctz - continue      Relevant Medications   lisinopril-hydrochlorothiazide (ZESTORETIC) 20-25 MG tablet   lovastatin (MEVACOR) 20 MG tablet   metFORMIN (GLUCOPHAGE-XR) 500 MG 24 hr tablet   Unintentional weight loss    10 lb weight loss over the past year, initially attributed to jardiance so dropped to MWF which he's tolerating well.       Relevant Orders    Ambulatory referral to Gastroenterology   GERD (gastroesophageal reflux disease)    He continues pepcid nightly.  H/o ulcers on EGD early 2000s. Treated for H pylori 2013.  See below.       Relevant Medications   omeprazole (PRILOSEC) 40 MG capsule   Other Relevant Orders   Ambulatory referral to Gastroenterology   Type 2 diabetes mellitus with other specified complication ([place])    Chronic, update A1c on metformin and MWF Jardiance. He used CGM for the past month, but concerned with cost. Will return to glucometer  use.       Relevant Medications   lisinopril-hydrochlorothiazide (ZESTORETIC) 20-25 MG tablet   lovastatin (MEVACOR) 20 MG tablet   metFORMIN (GLUCOPHAGE-XR) 500 MG 24 hr tablet   Other Relevant Orders   Hemoglobin A1c (Completed)   Microalbumin / creatinine urine ratio (Completed)   Tubular adenoma of colon    S/p colonoscopy 2016, then virtual colonoscopy 07/2019 through [facility]. See below.       BPH (benign prostatic hyperplasia)    Longstanding on terazosin, however last visit we stopped this - notes no significant difference in voiding. Will remain off med.       Melena    Describes dark tarry more malodorous stools for intermittently 2 months.  Now also endorsing some dysphagia, early satiety.  See above - will refer urgently to GI as may need upper imaging.       Relevant Orders   Fecal occult blood, imunochemical   GI pathogen panel by PCR, stool   CBC with Differential/Platelet (Completed)   Ambulatory referral to Gastroenterology   Hypertriglyceridemia    Update FLP only on lovastatin.       Relevant Medications   lisinopril-hydrochlorothiazide (ZESTORETIC) 20-25 MG tablet   lovastatin (MEVACOR) 20 MG tablet   Other Relevant Orders   Comprehensive metabolic panel (Completed)   Lipid panel (Completed)   Irregular heart beat    Update EKG - reassuringly sinus rhythm with rate variation.       Relevant Orders   EKG 12-Lead (Completed)   TSH  (Completed)   Dysphagia    Notes several months of dysphagia, odynophagia, early satiety and 10 lb weight loss, as well as occasional heartburn managed witih PRN pepcid and omeprazole.  Will recommend start omeprazole '40mg'$  daily. Will send off iFOB, check CBC, will expedite return to GI for further evaluation.  H/o ulcers on EGD early 2000s. Treated for H pylori 2013.       Relevant Orders   Ambulatory referral to Gastroenterology   Other Visit Diagnoses     Diarrhea, unspecified type       Relevant Orders   Fecal occult blood, imunochemical   GI pathogen panel by PCR, stool   Ambulatory referral to Gastroenterology   Abdominal pain, unspecified abdominal location       Relevant Orders   Lipase (Completed)   Ambulatory referral to Gastroenterology        Meds ordered this encounter  Medications   lisinopril-hydrochlorothiazide (ZESTORETIC) 20-25 MG tablet    Sig: Take 1 tablet by mouth daily.    Dispense:  90 tablet    Refill:  3   lovastatin (MEVACOR) 20 MG tablet    Sig: Take 1 tablet nightly for cholesterol    Dispense:  90 tablet    Refill:  3   metFORMIN (GLUCOPHAGE-XR) 500 MG 24 hr tablet    Sig: Take 2 tablets (1,000 mg total) by mouth daily with breakfast.    Dispense:  180 tablet    Refill:  3   omeprazole (PRILOSEC) 40 MG capsule    Sig: Take 1 capsule (40 mg total) by mouth daily.    Dispense:  90 capsule    Refill:  1   Orders Placed This Encounter  Procedures   Fecal occult blood, imunochemical    Standing Status:   Future    Standing Expiration Date:   09/24/2022   GI pathogen panel by PCR, stool    Standing Status:   Future    Standing  Expiration Date:   09/24/2022   Comprehensive metabolic panel   Lipid panel   TSH   Hemoglobin A1c   CBC with Differential/Platelet   Microalbumin / creatinine urine ratio   Lipase   Ambulatory referral to Gastroenterology    Referral Priority:   Urgent    Referral Type:   Consultation    Referral Reason:    Specialty Services Required    Number of Visits Requested:   1   EKG 12-Lead    Patient instructions: Pase por laboratorio para recoger examenes de heces x2.  Pase por laboratorio para Youth worker.  Electrocardiograma hoy  Siga trabajando en los directivos avanzados.  Pare aspirina mientras evaluamos diarrea.  Lo remitiremos a gastroenterologo pendiente los examenes de hoy  Regresar en 3 meses para proxima visita/seguimiento de diabetes.  Follow up plan: Return in about 3 months (around 12/24/2021) for follow up visit.  Ria Bush, MD

## 2021-09-23 NOTE — Patient Instructions (Addendum)
Pase por laboratorio para recoger examenes de heces x2.  Pase por laboratorio para Youth worker.  Electrocardiograma hoy  Siga trabajando en los directivos avanzados.  Pare aspirina mientras evaluamos diarrea.  Lo remitiremos a gastroenterolog pendiente los examenes de hoy  Regresar en 3 meses para proxima visita/seguimiento de diabetes.  Mantenimiento de la salud despus de los 72 aos de edad Health Maintenance After Age 81 Despus de los 65 aos de edad, corre un riesgo mayor de Tourist information centre manager enfermedades e infecciones a Barrister's clerk, como tambin de sufrir lesiones por cadas. Las cadas son la causa principal de las fracturas de huesos y lesiones en la cabeza de personas mayores de 2 aos de edad. Recibir cuidados preventivos de forma regular puede ayudarlo a mantenerse saludable y en buen [place]. Los cuidados preventivos incluyen realizarse anlisis de forma regular y Actor en el estilo de vida segn las recomendaciones del mdico. Converse con el mdico sobre lo siguiente: Las pruebas de deteccin y los anlisis que debe Dispensing optician. Una prueba de deteccin es un estudio que se para Hydrographic surveyor la presencia de una enfermedad cuando no tiene sntomas. Un plan de dieta y ejercicios adecuado para usted. Qu debo saber sobre las pruebas de deteccin y los anlisis para prevenir cadas? Realizarse pruebas de deteccin y [employer] es la mejor manera de Hydrographic surveyor un problema de salud de forma temprana. El diagnstico y tratamiento tempranos le brindan la mejor oportunidad de Chief Technology Officer las afecciones mdicas que son comunes despus de los 19 aos de edad. Ciertas afecciones y elecciones de estilo de vida pueden hacer que sea ms propenso a sufrir Engineer, manufacturing. El mdico puede recomendarle lo siguiente: Controles regulares de la visin. Una visin deficiente y afecciones como las cataratas pueden hacer que sea ms propenso a sufrir Engineer, manufacturing. Si [place] lentes, asegrese de obtener una receta  actualizada si su visin cambia. Revisin de medicamentos. Revise regularmente con el mdico todos los medicamentos que toma, incluidos los medicamentos de [place]. Consulte al [employer] efectos secundarios que pueden hacer que sea ms propenso a sufrir Engineer, manufacturing. Informe al mdico si alguno de los medicamentos que toma lo hace sentir mareado o somnoliento. Controles de fuerza y equilibrio. El mdico puede recomendar ciertos estudios para controlar su fuerza y equilibrio al estar de pie, al caminar o al cambiar de posicin. Examen de los pies. El dolor y Chiropractor en los pies, como tambin no utilizar el calzado [place], pueden hacer que sea ms propenso a sufrir Engineer, manufacturing. Pruebas de deteccin, que incluyen las siguientes: Pruebas de deteccin para la osteoporosis. La osteoporosis es una afeccin que hace que los huesos se tornen ms dbiles y se quiebren con ms facilidad. Pruebas de deteccin para la presin arterial. Los cambios en la presin arterial y los medicamentos para Chief Technology Officer la presin arterial pueden hacerlo sentir mareado. Prueba de deteccin de la depresin. Es ms probable que sufra una cada si tiene miedo a caerse, se siente deprimido o se siente incapaz de Patent examiner. Prueba de deteccin de consumo de alcohol. Beber demasiado alcohol puede afectar su equilibrio y puede hacer que sea ms propenso a sufrir Engineer, manufacturing. Siga estas indicaciones en su casa: Estilo de vida No beba alcohol si: Su mdico le indica no hacerlo. Si bebe alcohol: Limite la cantidad que bebe a lo siguiente: De 0 a 1 medida por da para las mujeres. De 0 a 2 medidas por da para los hombres. Sepa cunta cantidad de  alcohol hay en las bebidas que toma. En los [place], una medida equivale a una botella de cerveza de 12 oz (355 ml), un vaso de vino de 5 oz (148 ml) o un vaso de una bebida alcohlica de alta graduacin de 1 oz (44 ml). No consuma ningn  producto que contenga nicotina o tabaco. Estos productos incluyen cigarrillos, tabaco para Higher education careers adviser y aparatos de vapeo, como los Psychologist, sport and exercise. Si necesita ayuda para dejar de consumir estos productos, consulte al [employer]. Actividad  Siga un programa de ejercicio regular para mantenerse en forma. Esto lo ayudar a Contractor equilibrio. Consulte al mdico qu tipos de ejercicios son adecuados para usted. Si necesita un bastn o un andador, selo segn las recomendaciones del mdico. Utilice calzado con buen apoyo y suela antideslizante. Seguridad  Retire los [place] puedan causar tropiezos tales como alfombras, cables u obstculos. Instale equipos de seguridad, como barras para sostn en los baos y barandas de seguridad en las escaleras. [place] habitaciones y los pasillos bien iluminados. Indicaciones generales Hable con el mdico sobre sus riesgos de sufrir una cada. Infrmele a su mdico si: Se cae. Asegrese de informarle a su mdico acerca de todas las cadas, incluso aquellas que parecen ser [employer] Se siente mareado, cansado (tiene fatiga) o siente que pierde el equilibrio. Use los medicamentos de venta libre y los recetados solamente como se lo haya indicado el mdico. Estos incluyen suplementos. Siga una dieta sana y [place] un peso saludable. Una dieta saludable incluye productos lcteos descremados, carnes bajas en contenido de grasa ([place]), fibra de granos enteros, frijoles y [place] frutas y verduras. [place]. Realcese los estudios de rutina de la salud, dentales y de Public librarian. Resumen Tener un estilo de vida saludable y recibir cuidados preventivos pueden ayudar a Theatre stage manager salud y el bienestar despus de los 62 aos de [place]. Realizarse pruebas de deteccin y [employer] es la mejor manera de Hydrographic surveyor un problema de salud de forma temprana y Lourena Simmonds a Product/process development scientist una cada. El diagnstico y tratamiento tempranos le brindan la mejor  oportunidad de Chief Technology Officer las afecciones mdicas ms comunes en las personas mayores de 39 aos de edad. Las cadas son la causa principal de las fracturas de huesos y lesiones en la cabeza de personas mayores de 58 aos de edad. Tome precauciones para evitar una cada en su casa. Trabaje con el mdico para saber qu cambios que puede hacer para mejorar su salud y [place], y Eudora. Esta informacin no tiene Marine scientist el consejo del mdico. Asegrese de hacerle al mdico cualquier pregunta que tenga. Document Revised: 06/23/2020 Document Reviewed: 06/23/2020 Elsevier Patient Education  [place].

## 2021-09-24 ENCOUNTER — Encounter: Payer: Self-pay | Admitting: Family Medicine

## 2021-09-24 DIAGNOSIS — R1310 Dysphagia, unspecified: Secondary | ICD-10-CM | POA: Insufficient documentation

## 2021-09-24 MED ORDER — OMEPRAZOLE 40 MG PO CPDR: 40.0000 mg | DELAYED_RELEASE_CAPSULE | Freq: Every day | ORAL | 1 refills | Status: DC

## 2021-09-24 NOTE — Assessment & Plan Note (Signed)
Stable period only on lovastatin - continue off fibrate. The ASCVD Risk score (Arnett DK, et al., 2019) failed to calculate for the following reasons:   The 2019 ASCVD risk score is only valid for ages 72 to 56

## 2021-09-24 NOTE — Assessment & Plan Note (Signed)
Describes dark tarry more malodorous stools for intermittently 2 months.  Now also endorsing some dysphagia, early satiety.  See above - will refer urgently to GI as may need upper imaging.

## 2021-09-24 NOTE — Assessment & Plan Note (Signed)
Update EKG - reassuringly sinus rhythm with rate variation.

## 2021-09-24 NOTE — Assessment & Plan Note (Addendum)
Advanced directive discussion - does not have set up. Unsure who would be HCPOA. Packet previously provided. They will continue working on this.

## 2021-09-24 NOTE — Assessment & Plan Note (Addendum)
He continues pepcid nightly.  H/o ulcers on EGD early 2000s. Treated for H pylori 2013.  See below.

## 2021-09-24 NOTE — Assessment & Plan Note (Signed)
Notes several months of dysphagia, odynophagia, early satiety and 10 lb weight loss, as well as occasional heartburn managed witih PRN pepcid and omeprazole.  Will recommend start omeprazole '40mg'$  daily. Will send off iFOB, check CBC, will expedite return to GI for further evaluation.  H/o ulcers on EGD early 2000s. Treated for H pylori 2013.

## 2021-09-24 NOTE — Assessment & Plan Note (Addendum)
S/p colonoscopy 2016, then virtual colonoscopy 07/2019 through [facility]. See below.

## 2021-09-24 NOTE — Assessment & Plan Note (Addendum)
Chronic, update A1c on metformin and MWF Jardiance. He used CGM for the past month, but concerned with cost. Will return to glucometer use.

## 2021-09-24 NOTE — Assessment & Plan Note (Signed)
10 lb weight loss over the past year, initially attributed to jardiance so dropped to MWF which he's tolerating well.

## 2021-09-24 NOTE — Assessment & Plan Note (Signed)
Longstanding on terazosin, however last visit we stopped this - notes no significant difference in voiding. Will remain off med.

## 2021-09-24 NOTE — Assessment & Plan Note (Signed)
Chronic, stable on lisinopril/hctz - continue

## 2021-09-24 NOTE — Assessment & Plan Note (Signed)
Update FLP only on lovastatin.

## 2021-09-24 NOTE — Assessment & Plan Note (Addendum)
Chronic, stable, previously saw hematology thought ITP.  Will hold aspirin for now pending GI eval.

## 2021-09-26 ENCOUNTER — Other Ambulatory Visit (INDEPENDENT_AMBULATORY_CARE_PROVIDER_SITE_OTHER): Payer: Medicare Other

## 2021-09-26 DIAGNOSIS — R197 Diarrhea, unspecified: Secondary | ICD-10-CM

## 2021-09-26 DIAGNOSIS — K921 Melena: Secondary | ICD-10-CM

## 2021-09-26 LAB — FECAL OCCULT BLOOD, IMMUNOCHEMICAL: Fecal Occult Bld: NEGATIVE

## 2021-09-26 NOTE — Addendum Note (Signed)
Addended by: Lerry Liner on: 09/26/2021 03:11 PM   Modules accepted: Orders

## 2021-09-27 ENCOUNTER — Encounter: Payer: Self-pay | Admitting: Family Medicine

## 2021-09-28 ENCOUNTER — Encounter: Payer: Self-pay | Admitting: Family Medicine

## 2021-09-28 DIAGNOSIS — H43392 Other vitreous opacities, left eye: Secondary | ICD-10-CM | POA: Diagnosis not present

## 2021-09-28 DIAGNOSIS — E119 Type 2 diabetes mellitus without complications: Secondary | ICD-10-CM | POA: Diagnosis not present

## 2021-09-28 DIAGNOSIS — H43811 Vitreous degeneration, right eye: Secondary | ICD-10-CM | POA: Diagnosis not present

## 2021-09-28 DIAGNOSIS — H2513 Age-related nuclear cataract, bilateral: Secondary | ICD-10-CM | POA: Diagnosis not present

## 2021-09-28 DIAGNOSIS — H43821 Vitreomacular adhesion, right eye: Secondary | ICD-10-CM | POA: Diagnosis not present

## 2021-09-28 LAB — HM DIABETES EYE EXAM

## 2021-09-30 ENCOUNTER — Encounter: Payer: Self-pay | Admitting: Family Medicine

## 2021-10-04 ENCOUNTER — Other Ambulatory Visit: Payer: Medicare Other

## 2021-10-04 DIAGNOSIS — R197 Diarrhea, unspecified: Secondary | ICD-10-CM

## 2021-10-06 LAB — GASTROINTESTINAL PATHOGEN PNL
CampyloBacter Group: NOT DETECTED
Norovirus GI/GII: NOT DETECTED
Rotavirus A: NOT DETECTED
Salmonella species: NOT DETECTED
Shiga Toxin 1: NOT DETECTED
Shiga Toxin 2: NOT DETECTED
Shigella Species: NOT DETECTED
Vibrio Group: NOT DETECTED
Yersinia enterocolitica: NOT DETECTED

## 2021-10-10 ENCOUNTER — Other Ambulatory Visit: Payer: Self-pay | Admitting: Physician Assistant

## 2021-10-10 DIAGNOSIS — K219 Gastro-esophageal reflux disease without esophagitis: Secondary | ICD-10-CM | POA: Diagnosis not present

## 2021-10-10 DIAGNOSIS — R634 Abnormal weight loss: Secondary | ICD-10-CM | POA: Diagnosis not present

## 2021-10-10 DIAGNOSIS — R1310 Dysphagia, unspecified: Secondary | ICD-10-CM

## 2021-10-10 DIAGNOSIS — R195 Other fecal abnormalities: Secondary | ICD-10-CM | POA: Diagnosis not present

## 2021-10-11 ENCOUNTER — Other Ambulatory Visit: Payer: Self-pay | Admitting: Physician Assistant

## 2021-10-11 DIAGNOSIS — D696 Thrombocytopenia, unspecified: Secondary | ICD-10-CM

## 2021-10-11 DIAGNOSIS — N289 Disorder of kidney and ureter, unspecified: Secondary | ICD-10-CM

## 2021-10-11 DIAGNOSIS — R1084 Generalized abdominal pain: Secondary | ICD-10-CM

## 2021-10-11 DIAGNOSIS — R634 Abnormal weight loss: Secondary | ICD-10-CM

## 2021-10-14 ENCOUNTER — Ambulatory Visit
Admission: RE | Admit: 2021-10-14 | Discharge: 2021-10-14 | Disposition: A | Discharge status: Home / Self Care | Payer: Medicare Other | Source: Ambulatory Visit | Attending: Physician Assistant | Admitting: Physician Assistant

## 2021-10-14 DIAGNOSIS — R1310 Dysphagia, unspecified: Secondary | ICD-10-CM

## 2021-10-14 DIAGNOSIS — K219 Gastro-esophageal reflux disease without esophagitis: Secondary | ICD-10-CM | POA: Diagnosis not present

## 2021-10-14 DIAGNOSIS — K224 Dyskinesia of esophagus: Secondary | ICD-10-CM | POA: Diagnosis not present

## 2021-11-04 ENCOUNTER — Ambulatory Visit
Admission: RE | Admit: 2021-11-04 | Discharge: 2021-11-04 | Disposition: A | Discharge status: Home / Self Care | Payer: Medicare Other | Source: Ambulatory Visit | Attending: Physician Assistant | Admitting: Physician Assistant

## 2021-11-04 DIAGNOSIS — K5730 Diverticulosis of large intestine without perforation or abscess without bleeding: Secondary | ICD-10-CM | POA: Diagnosis not present

## 2021-11-04 DIAGNOSIS — D696 Thrombocytopenia, unspecified: Secondary | ICD-10-CM

## 2021-11-04 DIAGNOSIS — N289 Disorder of kidney and ureter, unspecified: Secondary | ICD-10-CM

## 2021-11-04 DIAGNOSIS — N281 Cyst of kidney, acquired: Secondary | ICD-10-CM | POA: Diagnosis not present

## 2021-11-04 DIAGNOSIS — R1084 Generalized abdominal pain: Secondary | ICD-10-CM

## 2021-11-04 DIAGNOSIS — R634 Abnormal weight loss: Secondary | ICD-10-CM

## 2021-11-04 MED ORDER — IOPAMIDOL (ISOVUE-300) INJECTION 61%
100.0000 mL | Freq: Once | INTRAVENOUS | Status: AC | PRN
Start: 2021-11-04 — End: 2021-11-04
  Administered 2021-11-04: 100 mL via INTRAVENOUS

## 2021-11-30 ENCOUNTER — Encounter: Payer: Self-pay | Admitting: Family Medicine

## 2021-12-16 ENCOUNTER — Telehealth: Payer: Self-pay

## 2021-12-16 ENCOUNTER — Other Ambulatory Visit (HOSPITAL_COMMUNITY): Payer: Self-pay

## 2021-12-16 MED ORDER — DEXCOM G7 SENSOR MISC
11 refills | Status: DC
Start: 2021-12-16 — End: 2022-01-18

## 2021-12-16 MED ORDER — DEXCOM G7 RECEIVER DEVI
0 refills | Status: DC
Start: 2021-12-16 — End: 2022-01-18

## 2021-12-16 NOTE — Telephone Encounter (Signed)
Pharmacy Patient Advocate Encounter   Received notification from [facility] that prior authorization for Dexcom G7 Sensor is required/requested.  PA submitted on 12/16/2021 to [employer] Part D via CoverMyMeds Key [id number]  Status is pending

## 2021-12-19 NOTE — Telephone Encounter (Signed)
Pharmacy Patient Advocate Encounter  Received notification from [employer] that the request for prior authorization for Dexcom G7 receiver, transmitter, and sensor has been denied due to not meeting the prior authorization requirements.     One of the following:  (A) You are being treated with insulin.  (B) You have a history of problematic hypoglycemia with documentation of recurrent level 2 hypoglycemic events [glucose less than '54mg'$ /dl (3.0 mmol/L)] that persist despite multiple attempts to adjust medication(s) and/or modify the diabetes treatment plan.  (C) You have a history of problematic hypoglycemia with documentation of a history of a level 3 hypoglycemic event [glucose less than '54mg'$ /dl (3.0 mmol/L)] characterized by altered mental and/or physical state requiring third-party assistance for treatment of hypoglycemia.  Key: [id number]

## 2021-12-19 NOTE — Telephone Encounter (Signed)
Notified via mychart.

## 2021-12-20 MED ORDER — OMEPRAZOLE 40 MG PO CPDR: 40.0000 mg | DELAYED_RELEASE_CAPSULE | Freq: Every day | ORAL | 2 refills | Status: DC

## 2021-12-20 NOTE — Addendum Note (Signed)
Addended by: Ria Bush on: 12/20/2021 08:01 AM   Modules accepted: Orders

## 2022-01-18 ENCOUNTER — Encounter: Payer: Self-pay | Admitting: Family Medicine

## 2022-01-18 ENCOUNTER — Ambulatory Visit (INDEPENDENT_AMBULATORY_CARE_PROVIDER_SITE_OTHER): Payer: Medicare Other | Admitting: Family Medicine

## 2022-01-18 VITALS — BP 124/78 | HR 69 | Temp 97.4°F | Ht 64.5 in | Wt 146.6 lb

## 2022-01-18 DIAGNOSIS — E1169 Type 2 diabetes mellitus with other specified complication: Secondary | ICD-10-CM

## 2022-01-18 DIAGNOSIS — I499 Cardiac arrhythmia, unspecified: Secondary | ICD-10-CM

## 2022-01-18 DIAGNOSIS — R197 Diarrhea, unspecified: Secondary | ICD-10-CM

## 2022-01-18 DIAGNOSIS — K219 Gastro-esophageal reflux disease without esophagitis: Secondary | ICD-10-CM

## 2022-01-18 LAB — POCT GLYCOSYLATED HEMOGLOBIN (HGB A1C): Hemoglobin A1C: 7 % — AB (ref 4.0–5.6)

## 2022-01-18 NOTE — Progress Notes (Signed)
Patient ID: Ricky Garrett, male    DOB: May 27, 1940, 81 y.o.   MRN: [id number]  This visit was conducted in person.  BP 124/78   Pulse 69   Temp (!) 97.4 F (36.3 C) (Temporal)   Ht 5' 4.5" (1.638 m)   Wt 146 lb 9.6 oz (66.5 kg)   SpO2 96%   BMI 24.78 kg/m    CC: 4 mo DM f/u visit  Subjective:   HPI: Ricky Garrett is a 81 y.o. male presenting on 01/18/2022 for Follow-up (Here for 3 mo f/u. Pt accompanied by daughter, [name]. )   Notes ongoing diarrhea for the past 4-6 months, intermittently. Sometimes normal BMs, other times watery stools. Very foul smelling stool. Symptoms may have started after jardiance commencement. Notes mid abdominal discomfort with diarrhea without nausea/vomiting, fever, blood in stool. Occ night time diarrhea.  No sick contacts at home.  No recent abx.  Recent GI pathogen panel normal 09/2021.  Omeprazole hasn't helped diarrhea but has helped GERD symptoms. He stopped pepcid.   Referred to GI last visit, s/p EGD - I don't have any records of workup. Will request today.   DM - does regularly check sugars daily 133 fasting, occasional 2nd check in pm. Compliant with antihyperglycemic regimen which includes: jardiance '10mg'$  MWF, metformin XR '1000mg'$  daily. Denies low sugars or hypoglycemic symptoms. Denies paresthesias, blurry vision. Last diabetic eye exam 08/2021. Glucometer brand: Accuchek. Last foot exam: 05/2021. DSME: 07/2019. Lab Results  Component Value Date   HGBA1C 7.0 (A) 01/18/2022   Diabetic Foot Exam - Simple   No data filed    Lab Results  Component Value Date   MICROALBUR <0.7 09/23/2021         Relevant past medical, surgical, family and social history reviewed and updated as indicated. Interim medical history since our last visit reviewed. Allergies and medications reviewed and updated. Outpatient Medications Prior to Visit  Medication Sig Dispense Refill   Accu-Chek Softclix Lancets lancets Use as instructed to check sugars twice  daily E11.69 200 each 3   aspirin EC 81 MG tablet Take 1 tablet (81 mg total) by mouth every Monday, Wednesday, and Friday.     empagliflozin (JARDIANCE) 10 MG TABS tablet Take 1 tablet (10 mg total) by mouth every other day. With breakfast 45 tablet 1   glucose blood (ACCU-CHEK AVIVA PLUS) test strip Use as instructed to check sugars twice daily E11.69 200 strip 3   lisinopril-hydrochlorothiazide (ZESTORETIC) 20-25 MG tablet Take 1 tablet by mouth daily. 90 tablet 3   lovastatin (MEVACOR) 20 MG tablet Take 1 tablet nightly for cholesterol 90 tablet 3   metFORMIN (GLUCOPHAGE-XR) 500 MG 24 hr tablet Take 2 tablets (1,000 mg total) by mouth daily with breakfast. 180 tablet 3   Multiple Vitamins-Minerals (CENTRUM SILVER) tablet Take 1 tablet by mouth daily.     omeprazole (PRILOSEC) 40 MG capsule Take 1 capsule (40 mg total) by mouth daily. 90 capsule 2   Continuous Blood Gluc Receiver (DEXCOM G7 RECEIVER) DEVI Use as directed to check sugars daily 1 each 0   Continuous Blood Gluc Sensor (DEXCOM G7 SENSOR) MISC Use as directed to check sugars daily. 2 each 11   famotidine (PEPCID) 20 MG tablet Take 1 tablet (20 mg total) by mouth daily. 90 tablet 3   No facility-administered medications prior to visit.     Per HPI unless specifically indicated in ROS section below Review of Systems  Objective:  BP 124/78   Pulse  69   Temp (!) 97.4 F (36.3 C) (Temporal)   Ht 5' 4.5" (1.638 m)   Wt 146 lb 9.6 oz (66.5 kg)   SpO2 96%   BMI 24.78 kg/m   Wt Readings from Last 3 Encounters:  01/18/22 146 lb 9.6 oz (66.5 kg)  09/23/21 144 lb 2 oz (65.4 kg)  07/26/21 147 lb (66.7 kg)      Physical Exam Vitals and nursing note reviewed.  Constitutional:      Appearance: Normal appearance. He is not ill-appearing.  HENT:     Mouth/Throat:     Comments: Wearing mask Eyes:     Extraocular Movements: Extraocular movements intact.     Pupils: Pupils are equal, round, and reactive to light.  Neck:      Thyroid: No thyroid mass or thyromegaly.  Cardiovascular:     Rate and Rhythm: Normal rate. Rhythm regularly irregular.     Pulses: Normal pulses.     Heart sounds: Normal heart sounds. No murmur heard. Pulmonary:     Effort: Pulmonary effort is normal. No respiratory distress.     Breath sounds: Normal breath sounds. No wheezing, rhonchi or rales.  Abdominal:     General: Bowel sounds are normal. There is no distension.     Palpations: Abdomen is soft. There is no mass.     Tenderness: There is no abdominal tenderness. There is no guarding or rebound.     Hernia: No hernia is present.  Musculoskeletal:     Right lower leg: No edema.     Left lower leg: No edema.  Neurological:     Mental Status: He is alert.  Psychiatric:        Mood and Affect: Mood normal.        Behavior: Behavior normal.       Results for orders placed or performed in visit on 01/18/22  POCT glycosylated hemoglobin (Hb A1C)  Result Value Ref Range   Hemoglobin A1C 7.0 (A) 4.0 - 5.6 %   HbA1c POC (<> result, manual entry)     HbA1c, POC (prediabetic range)     HbA1c, POC (controlled diabetic range)      Assessment & Plan:   Problem List Items Addressed This Visit     GERD (gastroesophageal reflux disease)    Stable period on omeprazole '40mg'$  daily - suggested finding minimal amount of dose needed to control GERD, ok to supplement with OTC pepcid PRN.       Type 2 diabetes mellitus with other specified complication (HCC) - Primary    Chronic, stable on current regimen. However given intermittent diarrhea which may have started after jardiance commencement, will hold jardiance for 2 wks, update with effect. See below.       Relevant Orders   POCT glycosylated hemoglobin (Hb A1C) (Completed)   Irregular heart beat    EKG last visit- sinus arrhythmia      Diarrhea of presumed infectious origin    iFOB negative, GI pathogen panel negative. Will send C diff testing. Will hold jardiance x2 wks and  monitor effect on diarrhea. Discussed limiting omeprazole use as able.  Longterm on Metformin XR.       Relevant Orders   C. difficile GDH and Toxin A/B     No orders of the defined types were placed in this encounter.  Orders Placed This Encounter  Procedures   C. difficile GDH and Toxin A/B   POCT glycosylated hemoglobin (Hb A1C)    Patient  instructions: Pare jardiance por 2 semanas y me deja saber si mejora la diarrhea.  Pase por laboratorio para 1 examen mas de heces (C difficile testing).  Busque minima cantidad de omeprazole necesaria para controlar [employer]. Si 2-3 a la semana es suficiente, use asi. Puede volver a tomar pepcid (famotidine) diario en vez de omeprazole.  Regresar en 3 meses para proxima visita  Follow up plan: Return in about 3 months (around 04/19/2022), or if symptoms worsen or fail to improve, for follow up visit.  Ria Bush, MD

## 2022-01-18 NOTE — Patient Instructions (Addendum)
Pare jardiance por 2 semanas y me deja saber si mejora la diarrhea.  Pase por laboratorio para 1 examen mas de heces (C difficile testing).  Busque minima cantidad de omeprazole necesaria para controlar [employer]. Si 2-3 a la semana es suficiente, use asi. Puede volver a tomar pepcid (famotidine) diario en vez de omeprazole.  Regresar en 3 meses para proxima visita

## 2022-01-18 NOTE — Addendum Note (Signed)
Addended by: Francella Solian on: 01/18/2022 09:43 AM   Modules accepted: Orders

## 2022-01-18 NOTE — Assessment & Plan Note (Signed)
Stable period on omeprazole '40mg'$  daily - suggested finding minimal amount of dose needed to control GERD, ok to supplement with OTC pepcid PRN.

## 2022-01-18 NOTE — Assessment & Plan Note (Signed)
Chronic, stable on current regimen. However given intermittent diarrhea which may have started after jardiance commencement, will hold jardiance for 2 wks, update with effect. See below.

## 2022-01-18 NOTE — Assessment & Plan Note (Signed)
iFOB negative, GI pathogen panel negative. Will send C diff testing. Will hold jardiance x2 wks and monitor effect on diarrhea. Discussed limiting omeprazole use as able.  Longterm on Metformin XR.

## 2022-01-18 NOTE — Assessment & Plan Note (Signed)
EKG last visit- sinus arrhythmia

## 2022-02-03 DIAGNOSIS — R634 Abnormal weight loss: Secondary | ICD-10-CM | POA: Diagnosis not present

## 2022-02-03 DIAGNOSIS — R1310 Dysphagia, unspecified: Secondary | ICD-10-CM | POA: Diagnosis not present

## 2022-02-03 DIAGNOSIS — K219 Gastro-esophageal reflux disease without esophagitis: Secondary | ICD-10-CM | POA: Diagnosis not present

## 2022-02-08 ENCOUNTER — Other Ambulatory Visit: Payer: Medicare Other

## 2022-02-08 DIAGNOSIS — R197 Diarrhea, unspecified: Secondary | ICD-10-CM | POA: Diagnosis not present

## 2022-02-09 LAB — C. DIFFICILE GDH AND TOXIN A/B
GDH ANTIGEN: NOT DETECTED
MICRO NUMBER:: 14413352
SPECIMEN QUALITY:: ADEQUATE
TOXIN A AND B: NOT DETECTED

## 2022-06-05 ENCOUNTER — Ambulatory Visit (INDEPENDENT_AMBULATORY_CARE_PROVIDER_SITE_OTHER): Payer: 59 | Admitting: Family Medicine

## 2022-06-05 ENCOUNTER — Encounter: Payer: Self-pay | Admitting: Family Medicine

## 2022-06-05 VITALS — BP 132/84 | HR 65 | Temp 97.3°F | Ht 64.5 in | Wt 152.4 lb

## 2022-06-05 DIAGNOSIS — Z7984 Long term (current) use of oral hypoglycemic drugs: Secondary | ICD-10-CM

## 2022-06-05 DIAGNOSIS — R197 Diarrhea, unspecified: Secondary | ICD-10-CM | POA: Diagnosis not present

## 2022-06-05 DIAGNOSIS — K219 Gastro-esophageal reflux disease without esophagitis: Secondary | ICD-10-CM

## 2022-06-05 DIAGNOSIS — R634 Abnormal weight loss: Secondary | ICD-10-CM | POA: Diagnosis not present

## 2022-06-05 DIAGNOSIS — E1169 Type 2 diabetes mellitus with other specified complication: Secondary | ICD-10-CM | POA: Diagnosis not present

## 2022-06-05 LAB — POCT GLYCOSYLATED HEMOGLOBIN (HGB A1C): Hemoglobin A1C: 7.5 % — AB (ref 4.0–5.6)

## 2022-06-05 NOTE — Assessment & Plan Note (Signed)
Chronic, deteriorated control since off jardiance. Weight loss has reversed off jardiance. He feels better off this.  Notes ongoing intermittent loose stools - will suggest changing metformin XR 500mg  2 tab daily to 1 tab BID, and if ongoing loose stools will trial 1 500mg  once daily.  Encouraged renewed efforts to follow low sugar low carb diabetic diet.

## 2022-06-05 NOTE — Patient Instructions (Addendum)
Siga omeprazole 30 minutos antes del almuerzo, trate cada otro dia.  Cambie metformina a 500mg  1 pastilla dos veces al dia.  Trate de identificar si hay algun tipo de comida que causa diarrhea.  Regresar en 4 meses para proximo fisico/wellness visit

## 2022-06-05 NOTE — Progress Notes (Signed)
Ph: [phone]       Fax: [phone]   Patient ID: Ricky Garrett, male    DOB: Jul 05, 1940, 82 y.o.   MRN: [id number]  This visit was conducted in person.  BP 132/84   Pulse 65   Temp (!) 97.3 F (36.3 C) (Temporal)   Ht 5' 4.5" (1.638 m)   Wt 152 lb 6 oz (69.1 kg)   SpO2 97%   BMI 25.75 kg/m    CC: 3 mo f/u visit  Subjective:   HPI: Ricky Garrett is a 82 y.o. male presenting on 06/05/2022 for Medical Management of Chronic Issues (Here for 3 mo f/u. Pt accompanied by daughter, Tonna Corner.)   Upcoming trip to [place] for 2 months over summer.   Saw [facility] Dr Dulce Sellar for diarrhea and GERD/dysphagia s/p CT scan - no obstruction, there were fatty liver and kidney cysts and enlarged prostate. EGD showed GERD with mild diffuse esophageal dysmotility.   Occ night time cramps.   DM - does regularly check sugars 08/2021. Compliant with antihyperglycemic regimen which includes: jardiance 10mg  QOD, metformin XR 1000mg  daily. Last visit we stopped jardiance to see if it was contributing to diarrhea - some better. Denies low sugars or hypoglycemic symptoms. Denies paresthesias, blurry vision. Last diabetic eye exam 08/2021. Glucometer brand: Accuchek. Last foot exam: 05/2021 - rpt today. DSME: 07/2019. Lab Results  Component Value Date   HGBA1C 7.5 (A) 06/05/2022   Diabetic Foot Exam - Simple   Simple Foot Form Diabetic Foot exam was performed with the following findings: Yes 06/05/2022  3:03 PM  Visual Inspection No deformities, no ulcerations, no other skin breakdown bilaterally: Yes Sensation Testing Intact to touch and monofilament testing bilaterally: Yes Pulse Check Posterior Tibialis and Dorsalis pulse intact bilaterally: Yes Comments    Lab Results  Component Value Date   MICROALBUR <0.7 09/23/2021         Relevant past medical, surgical, family and social history reviewed and updated as indicated. Interim medical history since our last visit reviewed. Allergies and  medications reviewed and updated. Outpatient Medications Prior to Visit  Medication Sig Dispense Refill   Accu-Chek Softclix Lancets lancets Use as instructed to check sugars twice daily E11.69 200 each 3   aspirin EC 81 MG tablet Take 1 tablet (81 mg total) by mouth every Monday, Wednesday, and Friday.     glucose blood (ACCU-CHEK AVIVA PLUS) test strip Use as instructed to check sugars twice daily E11.69 200 strip 3   lisinopril-hydrochlorothiazide (ZESTORETIC) 20-25 MG tablet Take 1 tablet by mouth daily. 90 tablet 3   lovastatin (MEVACOR) 20 MG tablet Take 1 tablet nightly for cholesterol 90 tablet 3   metFORMIN (GLUCOPHAGE-XR) 500 MG 24 hr tablet Take 2 tablets (1,000 mg total) by mouth daily with breakfast. 180 tablet 3   Multiple Vitamins-Minerals (CENTRUM SILVER) tablet Take 1 tablet by mouth daily.     omeprazole (PRILOSEC) 40 MG capsule Take 1 capsule (40 mg total) by mouth daily. 90 capsule 2   empagliflozin (JARDIANCE) 10 MG TABS tablet Take 1 tablet (10 mg total) by mouth every other day. With breakfast 45 tablet 1   No facility-administered medications prior to visit.     Per HPI unless specifically indicated in ROS section below Review of Systems  Objective:  BP 132/84   Pulse 65   Temp (!) 97.3 F (36.3 C) (Temporal)   Ht 5' 4.5" (1.638 m)   Wt 152 lb 6 oz (69.1 kg)  SpO2 97%   BMI 25.75 kg/m   Wt Readings from Last 3 Encounters:  06/05/22 152 lb 6 oz (69.1 kg)  01/18/22 146 lb 9.6 oz (66.5 kg)  09/23/21 144 lb 2 oz (65.4 kg)      Physical Exam Vitals and nursing note reviewed.  Constitutional:      Appearance: Normal appearance. He is not ill-appearing.  HENT:     Head: Normocephalic and atraumatic.  Eyes:     Extraocular Movements: Extraocular movements intact.     Conjunctiva/sclera: Conjunctivae normal.     Pupils: Pupils are equal, round, and reactive to light.  Cardiovascular:     Rate and Rhythm: Normal rate and regular rhythm.     Pulses: Normal  pulses.     Heart sounds: Normal heart sounds. No murmur heard. Pulmonary:     Effort: Pulmonary effort is normal. No respiratory distress.     Breath sounds: Normal breath sounds. No wheezing, rhonchi or rales.  Musculoskeletal:     Right lower leg: No edema.     Left lower leg: No edema.     Comments: See HPI for foot exam if done  Skin:    General: Skin is warm and dry.     Findings: No rash.  Neurological:     Mental Status: He is alert.  Psychiatric:        Mood and Affect: Mood normal.        Behavior: Behavior normal.       Results for orders placed or performed in visit on 06/05/22  POCT glycosylated hemoglobin (Hb A1C)  Result Value Ref Range   Hemoglobin A1C 7.5 (A) 4.0 - 5.6 %   HbA1c POC (<> result, manual entry)     HbA1c, POC (prediabetic range)     HbA1c, POC (controlled diabetic range)      Assessment & Plan:   Problem List Items Addressed This Visit     Unintentional weight loss    Jardiance related - resolved off medication      GERD (gastroesophageal reflux disease)    Stable period on omeprazole 40mg  daily. Discussed this may be contributing to diarrhea - will drop to QOD dosing.       Type 2 diabetes mellitus with other specified complication (HCC) - Primary    Chronic, deteriorated control since off jardiance. Weight loss has reversed off jardiance. He feels better off this.  Notes ongoing intermittent loose stools - will suggest changing metformin XR 500mg  2 tab daily to 1 tab BID, and if ongoing loose stools will trial 1 500mg  once daily.  Encouraged renewed efforts to follow low sugar low carb diabetic diet.       Relevant Orders   POCT glycosylated hemoglobin (Hb A1C) (Completed)   Diarrhea    Overall better however notes ongoing intermittent loose stools. See above for plan - change metformin XR to 500mg  BID, with option to drop to once daily. Decrease PPI frequency to QOD. Update with effect. Pt and daughter agree with plan.          No orders of the defined types were placed in this encounter.   Orders Placed This Encounter  Procedures   POCT glycosylated hemoglobin (Hb A1C)    Patient Instructions  Siga omeprazole 30 minutos antes del almuerzo, trate cada otro dia.  Cambie metformina a 500mg  1 pastilla dos veces al dia.  Trate de identificar si hay algun tipo de comida que causa diarrhea.  Regresar en 4 meses  para proximo fisico/wellness visit   Follow up plan: Return in about 4 months (around 10/06/2022) for annual exam, prior fasting for blood work.  Eustaquio Boyden, MD

## 2022-06-05 NOTE — Assessment & Plan Note (Signed)
Jardiance related - resolved off medication

## 2022-06-05 NOTE — Assessment & Plan Note (Signed)
Stable period on omeprazole 40mg  daily. Discussed this may be contributing to diarrhea - will drop to QOD dosing.

## 2022-06-05 NOTE — Assessment & Plan Note (Signed)
Overall better however notes ongoing intermittent loose stools. See above for plan - change metformin XR to 500mg  BID, with option to drop to once daily. Decrease PPI frequency to QOD. Update with effect. Pt and daughter agree with plan.

## 2022-07-04 DIAGNOSIS — Z85828 Personal history of other malignant neoplasm of skin: Secondary | ICD-10-CM | POA: Diagnosis not present

## 2022-07-04 DIAGNOSIS — D1801 Hemangioma of skin and subcutaneous tissue: Secondary | ICD-10-CM | POA: Diagnosis not present

## 2022-07-04 DIAGNOSIS — L821 Other seborrheic keratosis: Secondary | ICD-10-CM | POA: Diagnosis not present

## 2022-07-04 DIAGNOSIS — L570 Actinic keratosis: Secondary | ICD-10-CM | POA: Diagnosis not present

## 2022-07-04 DIAGNOSIS — D2371 Other benign neoplasm of skin of right lower limb, including hip: Secondary | ICD-10-CM | POA: Diagnosis not present

## 2022-07-04 DIAGNOSIS — L814 Other melanin hyperpigmentation: Secondary | ICD-10-CM | POA: Diagnosis not present

## 2022-07-04 DIAGNOSIS — D2372 Other benign neoplasm of skin of left lower limb, including hip: Secondary | ICD-10-CM | POA: Diagnosis not present

## 2022-08-01 ENCOUNTER — Ambulatory Visit (INDEPENDENT_AMBULATORY_CARE_PROVIDER_SITE_OTHER): Payer: 59

## 2022-08-01 VITALS — Ht 68.0 in | Wt 152.0 lb

## 2022-08-01 DIAGNOSIS — Z0000 Encounter for general adult medical examination without abnormal findings: Secondary | ICD-10-CM | POA: Diagnosis not present

## 2022-08-01 NOTE — Patient Instructions (Signed)
Ricky Garrett , Thank you for taking time to come for your Medicare Wellness Visit. I appreciate your ongoing commitment to your health goals. Please review the following plan we discussed and let me know if I can assist you in the future.   These are the goals we discussed:  Goals      Patient Stated     07/15/2020, I will continue to walk 2 days a week for 30 minutes.      Patient Stated     08/01/2022, maintain current health, wants to get off medication        This is a list of the screening recommended for you and due dates:  Health Maintenance  Topic Date Due   COVID-19 Vaccine (5 - 2023-24 season) 09/30/2021   Colon Cancer Screening  08/21/2022   Flu Shot  08/31/2022   Yearly kidney function blood test for diabetes  09/24/2022   Yearly kidney health urinalysis for diabetes  09/24/2022   Eye exam for diabetics  09/29/2022   Hemoglobin A1C  12/06/2022   Complete foot exam   06/05/2023   Medicare Annual Wellness Visit  08/01/2023   DTaP/Tdap/Td vaccine (3 - Td or Tdap) 01/04/2026   Pneumonia Vaccine  Completed   Zoster (Shingles) Vaccine  Completed   HPV Vaccine  Aged Out   Hepatitis C Screening  Discontinued    Advanced directives: Advance directive discussed with you today.  Conditions/risks identified: none  Next appointment: Follow up in one year for your annual wellness visit.   Preventive Care 81 Years and Older, Male  Preventive care refers to lifestyle choices and visits with your health care provider that can promote health and wellness. What does preventive care include? A yearly physical exam. This is also called an annual well check. Dental exams once or twice a year. Routine eye exams. Ask your health care provider how often you should have your eyes checked. Personal lifestyle choices, including: Daily care of your teeth and gums. Regular physical activity. Eating a healthy diet. Avoiding tobacco and drug use. Limiting alcohol use. Practicing safe  sex. Taking low doses of aspirin every day. Taking vitamin and mineral supplements as recommended by your health care provider. What happens during an annual well check? The services and screenings done by your health care provider during your annual well check will depend on your age, overall health, lifestyle risk factors, and family history of disease. Counseling  Your health care provider may ask you questions about your: Alcohol use. Tobacco use. Drug use. Emotional well-being. Home and relationship well-being. Sexual activity. Eating habits. History of falls. Memory and ability to understand (cognition). Work and work Astronomer. Screening  You may have the following tests or measurements: Height, weight, and BMI. Blood pressure. Lipid and cholesterol levels. These may be checked every 5 years, or more frequently if you are over 42 years old. Skin check. Lung cancer screening. You may have this screening every year starting at age 71 if you have a 30-pack-year history of smoking and currently smoke or have quit within the past 15 years. Fecal occult blood test (FOBT) of the stool. You may have this test every year starting at age 14. Flexible sigmoidoscopy or colonoscopy. You may have a sigmoidoscopy every 5 years or a colonoscopy every 10 years starting at age 32. Prostate cancer screening. Recommendations will vary depending on your family history and other risks. Hepatitis C blood test. Hepatitis B blood test. Sexually transmitted disease (STD) testing. Diabetes screening.  This is done by checking your blood sugar (glucose) after you have not eaten for a while (fasting). You may have this done every 1-3 years. Abdominal aortic aneurysm (AAA) screening. You may need this if you are a current or former smoker. Osteoporosis. You may be screened starting at age 74 if you are at high risk. Talk with your health care provider about your test results, treatment options, and if  necessary, the need for more tests. Vaccines  Your health care provider may recommend certain vaccines, such as: Influenza vaccine. This is recommended every year. Tetanus, diphtheria, and acellular pertussis (Tdap, Td) vaccine. You may need a Td booster every 10 years. Zoster vaccine. You may need this after age 3. Pneumococcal 13-valent conjugate (PCV13) vaccine. One dose is recommended after age 25. Pneumococcal polysaccharide (PPSV23) vaccine. One dose is recommended after age 4. Talk to your health care provider about which screenings and vaccines you need and how often you need them. This information is not intended to replace advice given to you by your health care provider. Make sure you discuss any questions you have with your health care provider. Document Released: 02/12/2015 Document Revised: 10/06/2015 Document Reviewed: 11/17/2014 Elsevier Interactive Patient Education  2017 [place] Prevention in the Home Falls can cause injuries. They can happen to people of all ages. There are many things you can do to make your home safe and to help prevent falls. What can I do on the outside of my home? Regularly fix the edges of walkways and driveways and fix any cracks. Remove anything that might make you trip as you walk through a door, such as a raised step or threshold. Trim any bushes or trees on the path to your home. Use bright outdoor lighting. Clear any walking paths of anything that might make someone trip, such as rocks or tools. Regularly check to see if handrails are loose or broken. Make sure that both sides of any steps have handrails. Any raised decks and porches should have guardrails on the edges. Have any leaves, snow, or ice cleared regularly. Use sand or salt on walking paths during winter. Clean up any spills in your garage right away. This includes oil or grease spills. What can I do in the bathroom? Use night lights. Install grab bars by the toilet  and in the tub and shower. Do not use towel bars as grab bars. Use non-skid mats or decals in the tub or shower. If you need to sit down in the shower, use a plastic, non-slip stool. Keep the floor dry. Clean up any water that spills on the floor as soon as it happens. Remove soap buildup in the tub or shower regularly. Attach bath mats securely with double-sided non-slip rug tape. Do not have throw rugs and other things on the floor that can make you trip. What can I do in the bedroom? Use night lights. Make sure that you have a light by your bed that is easy to reach. Do not use any sheets or blankets that are too big for your bed. They should not hang down onto the floor. Have a firm chair that has side arms. You can use this for support while you get dressed. Do not have throw rugs and other things on the floor that can make you trip. What can I do in the kitchen? Clean up any spills right away. Avoid walking on wet floors. Keep items that you use a lot in easy-to-reach places. If  you need to reach something above you, use a strong step stool that has a grab bar. Keep electrical cords out of the way. Do not use floor polish or wax that makes floors slippery. If you must use wax, use non-skid floor wax. Do not have throw rugs and other things on the floor that can make you trip. What can I do with my stairs? Do not leave any items on the stairs. Make sure that there are handrails on both sides of the stairs and use them. Fix handrails that are broken or loose. Make sure that handrails are as long as the stairways. Check any carpeting to make sure that it is firmly attached to the stairs. Fix any carpet that is loose or worn. Avoid having throw rugs at the top or bottom of the stairs. If you do have throw rugs, attach them to the floor with carpet tape. Make sure that you have a light switch at the top of the stairs and the bottom of the stairs. If you do not have them, ask someone to add  them for you. What else can I do to help prevent falls? Wear shoes that: Do not have high heels. Have rubber bottoms. Are comfortable and fit you well. Are closed at the toe. Do not wear sandals. If you use a stepladder: Make sure that it is fully opened. Do not climb a closed stepladder. Make sure that both sides of the stepladder are locked into place. Ask someone to hold it for you, if possible. Clearly mark and make sure that you can see: Any grab bars or handrails. First and last steps. Where the edge of each step is. Use tools that help you move around (mobility aids) if they are needed. These include: Canes. Walkers. Scooters. Crutches. Turn on the lights when you go into a dark area. Replace any light bulbs as soon as they burn out. Set up your furniture so you have a clear path. Avoid moving your furniture around. If any of your floors are uneven, fix them. If there are any pets around you, be aware of where they are. Review your medicines with your doctor. Some medicines can make you feel dizzy. This can increase your chance of falling. Ask your doctor what other things that you can do to help prevent falls. This information is not intended to replace advice given to you by your health care provider. Make sure you discuss any questions you have with your health care provider. Document Released: 11/12/2008 Document Revised: 06/24/2015 Document Reviewed: 02/20/2014 Elsevier Interactive Patient Education  2017 [employer].

## 2022-08-01 NOTE — Progress Notes (Signed)
Subjective:   Ricky Garrett is a 82 y.o. male who presents for Medicare Annual/Subsequent preventive examination.  Visit Complete: Virtual  I connected with  Cathlyn Parsons on 08/01/22 by a audio enabled telemedicine application and verified that I am speaking with the correct person using two identifiers. Daughter Arna Medici present on call.  Patient Location: Home  Provider Location: Office/Clinic  I discussed the limitations of evaluation and management by telemedicine. The patient expressed understanding and agreed to proceed.    Review of Systems     Cardiac Risk Factors include: advanced age (>2men, >85 women);diabetes mellitus;dyslipidemia;hypertension;male gender     Objective:    Today's Vitals   08/01/22 1041  Weight: 152 lb (68.9 kg)  Height: 5\' 8"  (1.727 m)   Body mass index is 23.11 kg/m.     08/01/2022   10:50 AM 07/26/2021   11:25 AM 07/15/2020    3:31 PM 07/04/2016    9:43 AM 07/02/2015    2:07 PM 05/14/2015   12:24 PM 01/28/2015    1:39 PM  Advanced Directives  Does Patient Have a Medical Advance Directive? No Yes No No No Yes No  Type of Special educational needs teacher of [place];Living will    Living will;Healthcare Power of Attorney   Does patient want to make changes to medical advance directive?      No - Patient declined   Copy of Healthcare Power of Attorney in Chart?  No - copy requested    No - copy requested   Would patient like information on creating a medical advance directive?   No - Patient declined No - Patient declined No - patient declined information  No - patient declined information    Current Medications (verified) Outpatient Encounter Medications as of 08/01/2022  Medication Sig   Accu-Chek Softclix Lancets lancets Use as instructed to check sugars twice daily E11.69   aspirin EC 81 MG tablet Take 1 tablet (81 mg total) by mouth every Monday, Wednesday, and Friday.   glucose blood (ACCU-CHEK AVIVA PLUS) test strip Use as instructed to  check sugars twice daily E11.69   lisinopril-hydrochlorothiazide (ZESTORETIC) 20-25 MG tablet Take 1 tablet by mouth daily.   lovastatin (MEVACOR) 20 MG tablet Take 1 tablet nightly for cholesterol   metFORMIN (GLUCOPHAGE-XR) 500 MG 24 hr tablet Take 2 tablets (1,000 mg total) by mouth daily with breakfast.   Multiple Vitamins-Minerals (CENTRUM SILVER) tablet Take 1 tablet by mouth daily.   omeprazole (PRILOSEC) 40 MG capsule Take 1 capsule (40 mg total) by mouth daily.   No facility-administered encounter medications on file as of 08/01/2022.    Allergies (verified) Patient has no known allergies.   History: Past Medical History:  Diagnosis Date   Cancer of skin of left ear 2019   seen by a dermatologist in [facility]   Decreased hearing    pt declines   Diabetes mellitus without complication (HCC)    GERD (gastroesophageal reflux disease)    History of stomach ulcers    Hyperlipidemia    Hypertension    Pigmented skin lesion 03/2020   R occipital scalp - pigmented lichen planus-like keratosis ([place])   Rhinitis, allergic    Thrombocytopenia (HCC)    Past Surgical History:  Procedure Laterality Date   COLONOSCOPY  2011   1 polyp (outlaw)   COLONOSCOPY  01/2014   diverticulosis, 2 polyps, hemorrhoids, rpt 5 yrs (Outlaw)   NASAL SEPTUM SURGERY  1990   Family History  Problem Relation Age of  Onset   CAD Neg Hx    Stroke Neg Hx    Diabetes Neg Hx    Cancer Neg Hx    Social History   Socioeconomic History   Marital status: Married    Spouse name: Reggie Pile de Milagros Evener   Number of children: Not on file   Years of education: Not on file   Highest education level: Not on file  Occupational History    Employer: [employer]  Tobacco Use   Smoking status: Former    Packs/day: 0.50    Years: 1.00    Additional pack years: 0.00    Total pack years: 0.50    Types: Cigarettes    Quit date: 1990    Years since quitting: 34.5   Smokeless tobacco: Never  Vaping  Use   Vaping Use: Never used  Substance and Sexual Activity   Alcohol use: Not Currently    Alcohol/week: 2.0 standard drinks of alcohol    Types: 1 Glasses of wine, 1 Cans of beer per week   Drug use: No   Sexual activity: Yes    Partners: Female  Other Topics Concern   Not on file  Social History Narrative   Lives with daughter Arna Medici) and her husband, 2 grand children, and wife    Moved from [place] to the [place] (2008).    Was living in [place] and moved to [place] first and then to [facility] (2008).    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 Cares for grandchildren.    Edu - 5th grade    Living in [place] with wife and daughter's family.    Activity: walking regularly   Diet: good water, fruits/vegetables daily    Social Determinants of Health   Financial Resource Strain: Low Risk  (08/01/2022)   Overall Financial Resource Strain (CARDIA)    Difficulty of Paying Living Expenses: Not hard at all  Food Insecurity: No Food Insecurity (08/01/2022)   Hunger Vital Sign    Worried About Running Out of Food in the Last Year: Never true    Ran Out of Food in the Last Year: Never true  Transportation Needs: No Transportation Needs (08/01/2022)   PRAPARE - Administrator, Civil Service (Medical): No    Lack of Transportation (Non-Medical): No  Physical Activity: Insufficiently Active (08/01/2022)   Exercise Vital Sign    Days of Exercise per Week: 3 days    Minutes of Exercise per Session: 30 min  Stress: No Stress Concern Present (08/01/2022)   [employer] of Occupational Health - Occupational Stress Questionnaire    Feeling of Stress : Not at all  Social Connections: Moderately Isolated (08/01/2022)   Social Connection and Isolation Panel [NHANES]    Frequency of Communication with Friends and Family: More than three times a week    Frequency of Social Gatherings with Friends and Family: Once a week    Attends Religious Services: Never    Database administrator  or Organizations: No    Attends Engineer, structural: Never    Marital Status: Married    Tobacco Counseling Counseling given: Not Answered   Clinical Intake:  Pre-visit preparation completed: Yes  Pain : No/denies pain     Nutritional Status: BMI of 19-24  Normal Nutritional Risks: None Diabetes: Yes CBG done?: No Did pt. bring in CBG monitor from home?: No  How often do you need to have someone help you when you read instructions, pamphlets,  or other written materials from your doctor or pharmacy?: 4 - Often  Interpreter Needed?: No  Comments: daughter translates Information entered by :: NAllen LPN   Activities of Daily Living    08/01/2022   10:45 AM  In your present state of health, do you have any difficulty performing the following activities:  Hearing? 1  Comment has hearing aids  Vision? 0  Difficulty concentrating or making decisions? 0  Walking or climbing stairs? 0  Dressing or bathing? 0  Doing errands, shopping? 1  Comment daughters accompany  Preparing Food and eating ? N  Using the Toilet? N  In the past six months, have you accidently leaked urine? N  Do you have problems with loss of bowel control? N  Managing your Medications? N  Managing your Finances? N  Housekeeping or managing your Housekeeping? N    Patient Care Team: Eustaquio Boyden, MD as PCP - General (Family Medicine) Artis Delay, MD as Consulting Physician (Hematology and Oncology) Kathyrn Sheriff, [facility] (Inactive) as Pharmacist (Pharmacist)  Indicate any recent Medical Services you may have received from other than [facility] providers in the past year (date may be approximate).     Assessment:   This is a routine wellness examination for Bryce.  Hearing/Vision screen Hearing Screening - Comments:: Wears hearing aids that are maintained Vision Screening - Comments:: Regular eye exams, [facility]  Dietary issues and exercise activities discussed:     Goals  Addressed             This Visit's Progress    Patient Stated       08/01/2022, maintain current health, wants to get off medication       Depression Screen    08/01/2022   10:51 AM 06/05/2022    2:38 PM 07/26/2021   11:23 AM 07/15/2020    3:31 PM 12/23/2018    4:05 PM 10/31/2017    8:47 AM 02/23/2017    9:12 AM  PHQ 2/9 Scores  PHQ - 2 Score 0 1 0 0 0 0 0  PHQ- 9 Score 1 4  0       Fall Risk    08/01/2022   10:50 AM 07/26/2021   11:25 AM 07/15/2020    3:31 PM 12/23/2018    4:04 PM 12/20/2018   12:04 PM  Fall Risk   Falls in the past year? 0 0 0 0 0  Comment     Emmi Telephone Survey: data to providers prior to load  Number falls in past yr: 0 0 0    Injury with Fall? 0 0 0    Risk for fall due to : Medication side effect No Fall Risks Medication side effect    Follow up Falls prevention discussed;Falls evaluation completed Falls prevention discussed Falls evaluation completed;Falls prevention discussed      MEDICARE RISK AT HOME:  Medicare Risk at Home - 08/01/22 1050     Any stairs in or around the home? Yes    If so, are there any without handrails? No    Home free of loose throw rugs in walkways, pet beds, electrical cords, etc? Yes    Adequate lighting in your home to reduce risk of falls? Yes    Life alert? No    Use of a cane, walker or w/c? No    Grab bars in the bathroom? Yes    Shower chair or bench in shower? No    Elevated toilet seat or a  handicapped toilet? No             TIMED UP AND GO:  Was the test performed?  No    Cognitive Function:  6 CIT not administered due to language barrier.     07/15/2020    3:46 PM  MMSE - Mini Mental State Exam  Not completed: Unable to complete        Immunizations Immunization History  Administered Date(s) Administered   Fluad Quad(high Dose 65+) 11/01/2021   Influenza Inj Mdck Quad Pf 10/30/2017   Influenza Split 11/14/2010   Influenza Whole 11/29/2007, 11/09/2008, 11/04/2010   Influenza, High  Dose Seasonal PF 10/20/2014, 11/09/2015, 10/24/2018, 10/23/2019, 10/21/2020   Influenza,inj,Quad PF,6+ Mos 09/24/2012   Influenza-Unspecified 10/30/2013, 02/27/2016, 10/10/2016   PFIZER(Purple Top)SARS-COV-2 Vaccination 02/13/2019, 03/06/2019, 11/12/2019, 07/06/2020   Pneumococcal Conjugate-13 08/30/2018   Pneumococcal Polysaccharide-23 10/14/2009, 01/05/2016   Td 03/30/2005   Tdap 01/05/2016   Zoster Recombinant(Shingrix) 01/09/2019, 04/04/2019   Zoster, Live 02/23/2017    TDAP status: Up to date  Flu Vaccine status: Up to date  Pneumococcal vaccine status: Up to date  Covid-19 vaccine status: Information provided on how to obtain vaccines.   Qualifies for Shingles Vaccine? Yes   Zostavax completed Yes   Shingrix Completed?: Yes  Screening Tests Health Maintenance  Topic Date Due   COVID-19 Vaccine (5 - 2023-24 season) 09/30/2021   Colonoscopy  08/21/2022   INFLUENZA VACCINE  08/31/2022   Diabetic kidney evaluation - eGFR measurement  09/24/2022   Diabetic kidney evaluation - Urine ACR  09/24/2022   OPHTHALMOLOGY EXAM  09/29/2022   HEMOGLOBIN A1C  12/06/2022   FOOT EXAM  06/05/2023   Medicare Annual Wellness (AWV)  08/01/2023   DTaP/Tdap/Td (3 - Td or Tdap) 01/04/2026   Pneumonia Vaccine 74+ Years old  Completed   Zoster Vaccines- Shingrix  Completed   HPV VACCINES  Aged Out   Hepatitis C Screening  Discontinued    Health Maintenance  Health Maintenance Due  Topic Date Due   COVID-19 Vaccine (5 - 2023-24 season) 09/30/2021   Colonoscopy  08/21/2022    Colorectal cancer screening: No longer required.   Lung Cancer Screening: (Low Dose CT Chest recommended if Age 52-80 years, 20 pack-year currently smoking OR have quit w/in 15years.) does not qualify.   Lung Cancer Screening Referral: no  Additional Screening:  Hepatitis C Screening: does not qualify;   Vision Screening: Recommended annual ophthalmology exams for early detection of glaucoma and other  disorders of the eye. Is the patient up to date with their annual eye exam?  Yes  Who is the provider or what is the name of the office in which the patient attends annual eye exams? [facility] If pt is not established with a provider, would they like to be referred to a provider to establish care? No .   Dental Screening: Recommended annual dental exams for proper oral hygiene  Diabetic Foot Exam: Diabetic Foot Exam: Completed 06/05/2022  Community Resource Referral / Chronic Care Management: CRR required this visit?  No   CCM required this visit?  No     Plan:     I have personally reviewed and noted the following in the patient's chart:   Medical and social history Use of alcohol, tobacco or illicit drugs  Current medications and supplements including opioid prescriptions. Patient is not currently taking opioid prescriptions. Functional ability and status Nutritional status Physical activity Advanced directives List of other physicians Hospitalizations, surgeries, and ER  visits in previous 12 months Vitals Screenings to include cognitive, depression, and falls Referrals and appointments  In addition, I have reviewed and discussed with patient certain preventive protocols, quality metrics, and best practice recommendations. A written personalized care plan for preventive services as well as general preventive health recommendations were provided to patient.     Barb Merino, LPN   02/04/1094   After Visit Summary: (MyChart) Due to this being a telephonic visit, the after visit summary with patients personalized plan was offered to patient via MyChart   Nurse Notes: none

## 2022-09-29 DIAGNOSIS — H43821 Vitreomacular adhesion, right eye: Secondary | ICD-10-CM | POA: Diagnosis not present

## 2022-09-29 DIAGNOSIS — H43392 Other vitreous opacities, left eye: Secondary | ICD-10-CM | POA: Diagnosis not present

## 2022-09-29 DIAGNOSIS — E119 Type 2 diabetes mellitus without complications: Secondary | ICD-10-CM | POA: Diagnosis not present

## 2022-09-29 DIAGNOSIS — H2511 Age-related nuclear cataract, right eye: Secondary | ICD-10-CM | POA: Diagnosis not present

## 2022-09-29 DIAGNOSIS — H43811 Vitreous degeneration, right eye: Secondary | ICD-10-CM | POA: Diagnosis not present

## 2022-09-29 LAB — HM DIABETES EYE EXAM

## 2022-10-19 DIAGNOSIS — H2511 Age-related nuclear cataract, right eye: Secondary | ICD-10-CM | POA: Diagnosis not present

## 2022-10-21 ENCOUNTER — Other Ambulatory Visit: Payer: Self-pay | Admitting: Family Medicine

## 2022-10-21 DIAGNOSIS — E1169 Type 2 diabetes mellitus with other specified complication: Secondary | ICD-10-CM

## 2022-10-21 DIAGNOSIS — E781 Pure hyperglyceridemia: Secondary | ICD-10-CM

## 2022-10-22 ENCOUNTER — Other Ambulatory Visit: Payer: Self-pay | Admitting: Family Medicine

## 2022-10-22 DIAGNOSIS — I152 Hypertension secondary to endocrine disorders: Secondary | ICD-10-CM

## 2022-10-25 NOTE — Telephone Encounter (Signed)
Please call and schedule CPE with fasting labs prior with Dr. Sharen Hones.

## 2022-10-26 NOTE — Telephone Encounter (Signed)
LVM for patient to c/b and schedule.  

## 2022-10-31 HISTORY — PX: CATARACT EXTRACTION: SUR2

## 2022-11-03 ENCOUNTER — Ambulatory Visit (INDEPENDENT_AMBULATORY_CARE_PROVIDER_SITE_OTHER): Payer: 59 | Admitting: Family Medicine

## 2022-11-03 ENCOUNTER — Encounter: Payer: Self-pay | Admitting: Family Medicine

## 2022-11-03 VITALS — BP 134/78 | HR 62 | Temp 97.8°F | Ht 68.0 in | Wt 146.4 lb

## 2022-11-03 DIAGNOSIS — Z7984 Long term (current) use of oral hypoglycemic drugs: Secondary | ICD-10-CM

## 2022-11-03 DIAGNOSIS — Z23 Encounter for immunization: Secondary | ICD-10-CM

## 2022-11-03 DIAGNOSIS — R197 Diarrhea, unspecified: Secondary | ICD-10-CM

## 2022-11-03 DIAGNOSIS — R634 Abnormal weight loss: Secondary | ICD-10-CM | POA: Diagnosis not present

## 2022-11-03 DIAGNOSIS — E1169 Type 2 diabetes mellitus with other specified complication: Secondary | ICD-10-CM

## 2022-11-03 LAB — COMPREHENSIVE METABOLIC PANEL
ALT: 22 U/L (ref 0–53)
AST: 22 U/L (ref 0–37)
Albumin: 4.3 g/dL (ref 3.5–5.2)
Alkaline Phosphatase: 75 U/L (ref 39–117)
BUN: 17 mg/dL (ref 6–23)
CO2: 35 meq/L — ABNORMAL HIGH (ref 19–32)
Calcium: 9.5 mg/dL (ref 8.4–10.5)
Chloride: 97 meq/L (ref 96–112)
Creatinine, Ser: 0.83 mg/dL (ref 0.40–1.50)
GFR: 81.5 mL/min (ref >60.00)
Glucose, Bld: 134 mg/dL — ABNORMAL HIGH (ref 70–99)
Potassium: 4.1 meq/L (ref 3.5–5.1)
Sodium: 137 meq/L (ref 135–145)
Total Bilirubin: 0.6 mg/dL (ref 0.2–1.2)
Total Protein: 7.2 g/dL (ref 6.0–8.3)

## 2022-11-03 LAB — CBC WITH DIFFERENTIAL/PLATELET
Basophils Absolute: 0 10*3/uL (ref 0.0–0.1)
Basophils Relative: 0.7 % (ref 0.0–3.0)
Eosinophils Absolute: 0.1 10*3/uL (ref 0.0–0.7)
Eosinophils Relative: 2 % (ref 0.0–5.0)
HCT: 43.9 % (ref 39.0–52.0)
Hemoglobin: 14.5 g/dL (ref 13.0–17.0)
Lymphocytes Relative: 27 % (ref 12.0–46.0)
Lymphs Abs: 1.5 10*3/uL (ref 0.7–4.0)
MCHC: 33.1 g/dL (ref 30.0–36.0)
MCV: 89 fL (ref 78.0–100.0)
Monocytes Absolute: 0.4 10*3/uL (ref 0.1–1.0)
Monocytes Relative: 7.9 % (ref 3.0–12.0)
Neutro Abs: 3.5 10*3/uL (ref 1.4–7.7)
Neutrophils Relative %: 62.4 % (ref 43.0–77.0)
Platelets: 109 10*3/uL — ABNORMAL LOW (ref 150.0–400.0)
RBC: 4.93 Mil/uL (ref 4.22–5.81)
RDW: 13.9 % (ref 11.5–15.5)
WBC: 5.6 10*3/uL (ref 4.0–10.5)

## 2022-11-03 LAB — POCT GLYCOSYLATED HEMOGLOBIN (HGB A1C): Hemoglobin A1C: 8.8 % — AB (ref 4.0–5.6)

## 2022-11-03 LAB — LIPASE: Lipase: 46 U/L (ref 11.0–59.0)

## 2022-11-03 MED ORDER — OMEPRAZOLE 40 MG PO CPDR: 40.0000 mg | DELAYED_RELEASE_CAPSULE | ORAL | 2 refills | Status: DC

## 2022-11-03 MED ORDER — PIOGLITAZONE HCL 15 MG PO TABS: 15.0000 mg | ORAL_TABLET | Freq: Every day | ORAL | 6 refills | Status: DC

## 2022-11-03 NOTE — Patient Instructions (Addendum)
We will request latest diabetic eye exam from Dr Marchelle Gearing Flu shot today Haga cita de seguimiento con gastroenterologo.  Pase por laboratorio para recoger examen de heces.  Bake omeprazole a 40mg  cada otro dia Comienze actos 15mg  diarios. Regresar en 3 meses para proxima visita

## 2022-11-03 NOTE — Assessment & Plan Note (Addendum)
Longstanding, s/p overall reassuring GI eval including CT abd/pelvis and EGD ([facility] Dr Dulce Sellar).  He had normal C diff test and GI pathogen panel within the past year.  Last visit seemed to be improving however now notes ongoing difficulty with loose stools associated with stool urgency.  Will decrease PPI to every other day dosing.  In longterm diabetic and noted 6 lb weight loss, check for malabsorption with fecal fat and fecal pancreatic elastase. Update labwork including CBC, CMP, lipase I did recommend scheduling GI f/u as well.

## 2022-11-03 NOTE — Progress Notes (Signed)
Ph: [phone] Fax: [phone]   Patient ID: Ricky Garrett, male    DOB: 02-26-1940, 82 y.o.   MRN: [id number]  This visit was conducted in person.  BP 134/78   Pulse 62   Temp 97.8 F (36.6 C) (Oral)   Ht 5\' 8"  (1.727 m)   Wt 146 lb 6 oz (66.4 kg)   BMI 22.26 kg/m    CC: DM f/u visit  Subjective:   HPI:  Ricky Garrett is a 82 y.o. male presenting on 11/03/2022 for Medical Management of Chronic Issues (Here for DM/HTN f/u. Pt accompanied by daughter, Nelva Bush. ) and GI Problem (C/o diarrhea after eating certain foods. )   Saw health advisor for medicare wellness visit 07/2022. Has upcoming CPE 01/2023.   Notes ongoing stool urgency with loose stools. Sometimes at night time awakens with epigastric pain. Notes worse symptoms with greasy foods. 6 lb weight loss. Notes increased gassiness, more malodorous, watery stools. They use city water, use filter for home.   No fever/chills, constipation,nausea/vomiting. No black or tarry stools or red stools. No pallor to stool. No increased buoyancy.   Saw [facility] Dr Dulce Sellar for diarrhea and GERD/dysphagia s/p CT scan 10/2021 - no obstruction, there were fatty liver and kidney cysts and enlarged prostate. EGD showed significant GERD with mild diffuse esophageal dysmotility. Last seen 01/2022, plan return 6 months - due for f/u.   Colonoscopy 01/214 - 2 small adenomatous polyps removed, good prep, biopsies negative for microscopic colitis.  Virtual colonoscopy 07/2019 - large amt retained barium througout colon, no polyps or masses.   DM - does not regularly check sugars - yesterday 146 fasting. Compliant with antihyperglycemic regimen which includes: metformin XR 1000mg  daily. Jardiance stopped due to diarrhea, metformin changed from IR to XR. Denies low sugars or hypoglycemic symptoms. Denies paresthesias, blurry vision. Last diabetic eye exam last week (Dr Dione Booze). Glucometer brand: accuchek. Last foot exam: 05/2022. DSME: 07/2019.  Lab Results   Component Value Date   HGBA1C 8.8 (A) 11/03/2022   Diabetic Foot Exam - Simple   No data filed    Lab Results  Component Value Date   MICROALBUR <0.7 09/23/2021        Relevant past medical, surgical, family and social history reviewed and updated as indicated. Interim medical history since our last visit reviewed. Allergies and medications reviewed and updated. Outpatient Medications Prior to Visit  Medication Sig Dispense Refill   Accu-Chek Softclix Lancets lancets Use as instructed to check sugars twice daily E11.69 200 each 3   aspirin EC 81 MG tablet Take 1 tablet (81 mg total) by mouth every Monday, Wednesday, and Friday.     glucose blood (ACCU-CHEK AVIVA PLUS) test strip Use as instructed to check sugars twice daily E11.69 200 strip 3   lisinopril-hydrochlorothiazide (ZESTORETIC) 20-25 MG tablet TAKE 1 TABLET BY MOUTH EVERY DAY 90 tablet 0   lovastatin (MEVACOR) 20 MG tablet TAKE 1 TABLET NIGHTLY FOR CHOLESTEROL 90 tablet 0   metFORMIN (GLUCOPHAGE-XR) 500 MG 24 hr tablet TAKE 2 TABLETS BY MOUTH EVERY DAY WITH BREAKFAST 180 tablet 0   Multiple Vitamins-Minerals (CENTRUM SILVER) tablet Take 1 tablet by mouth daily.     omeprazole (PRILOSEC) 40 MG capsule Take 1 capsule (40 mg total) by mouth daily. 90 capsule 2   No facility-administered medications prior to visit.     Per HPI unless specifically indicated in ROS section below Review of Systems  Objective:  BP 134/78   Pulse 62  Temp 97.8 F (36.6 C) (Oral)   Ht 5\' 8"  (1.727 m)   Wt 146 lb 6 oz (66.4 kg)   BMI 22.26 kg/m   Wt Readings from Last 3 Encounters:  11/03/22 146 lb 6 oz (66.4 kg)  08/01/22 152 lb (68.9 kg)  06/05/22 152 lb 6 oz (69.1 kg)      Physical Exam Vitals and nursing note reviewed.  Constitutional:      Appearance: Normal appearance. He is not ill-appearing.  Eyes:     Extraocular Movements: Extraocular movements intact.     Conjunctiva/sclera: Conjunctivae normal.     Pupils: Pupils  are equal, round, and reactive to light.  Cardiovascular:     Rate and Rhythm: Normal rate and regular rhythm.     Pulses: Normal pulses.     Heart sounds: Normal heart sounds. No murmur heard. Pulmonary:     Effort: Pulmonary effort is normal. No respiratory distress.     Breath sounds: Normal breath sounds. No wheezing, rhonchi or rales.  Abdominal:     General: Bowel sounds are normal. There is no distension.     Palpations: Abdomen is soft. There is no mass.     Tenderness: There is abdominal tenderness in the right upper quadrant and epigastric area. There is no guarding or rebound. Negative signs include Murphy's sign and Rovsing's sign.     Hernia: No hernia is present.  Musculoskeletal:     Right lower leg: No edema.     Left lower leg: No edema.     Comments: See HPI for foot exam if done  Skin:    General: Skin is warm and dry.     Findings: No rash.  Neurological:     Mental Status: He is alert.  Psychiatric:        Mood and Affect: Mood normal.        Behavior: Behavior normal.       Results for orders placed or performed in visit on 11/03/22  POCT glycosylated hemoglobin (Hb A1C)  Result Value Ref Range   Hemoglobin A1C 8.8 (A) 4.0 - 5.6 %   HbA1c POC (<> result, manual entry)     HbA1c, POC (prediabetic range)     HbA1c, POC (controlled diabetic range)     Lab Results  Component Value Date   NA 134 (L) 09/23/2021   CL 100 09/23/2021   K 4.0 09/23/2021   CO2 32 09/23/2021   BUN 21 09/23/2021   CREATININE 0.93 09/23/2021   GFR 77.06 09/23/2021   CALCIUM 9.5 09/23/2021   PHOS 3.0 03/08/2021   ALBUMIN 4.5 09/23/2021   GLUCOSE 100 (H) 09/23/2021    Lab Results  Component Value Date   ALT 22 09/23/2021   AST 22 09/23/2021   ALKPHOS 59 09/23/2021   BILITOT 0.7 09/23/2021   Lab Results  Component Value Date   WBC 4.7 09/23/2021   HGB 14.4 09/23/2021   HCT 43.1 09/23/2021   MCV 90.0 09/23/2021   PLT 103.0 (L) 09/23/2021   Assessment & Plan:    Problem List Items Addressed This Visit     Unintentional weight loss   Type 2 diabetes mellitus with other specified complication (HCC) - Primary    Chronic, deteriorated despite continuing metformin XR 1000mg  daily with breakfast.  Jardiance intolerance (weight loss, ?diarrhea).  Avoid GLP1RA in h/o recent GI difficulty.  He's hesitant to start insulin.  Start actos 15mg  daily. Update urinalysis next visit.  Reassess control at CPE  in 3 months.       Relevant Medications   pioglitazone (ACTOS) 15 MG tablet   Other Relevant Orders   POCT glycosylated hemoglobin (Hb A1C) (Completed)   Diarrhea    Longstanding, s/p overall reassuring GI eval including CT abd/pelvis and EGD ([facility] Dr Dulce Sellar).  He had normal C diff test and GI pathogen panel within the past year.  Last visit seemed to be improving however now notes ongoing difficulty with loose stools associated with stool urgency.  Will decrease PPI to every other day dosing.  In longterm diabetic and noted 6 lb weight loss, check for malabsorption with fecal fat and fecal pancreatic elastase. Update labwork including CBC, CMP, lipase I did recommend scheduling GI f/u as well.       Relevant Orders   CBC with Differential/Platelet   Comprehensive metabolic panel   Lipase   Pancreatic Elastase, Fecal   Fecal fat, quantitative   Other Visit Diagnoses     Encounter for immunization       Relevant Orders   Flu Vaccine Trivalent High Dose (Fluad) (Completed)        Meds ordered this encounter  Medications   omeprazole (PRILOSEC) 40 MG capsule    Sig: Take 1 capsule (40 mg total) by mouth every other day.    Dispense:  45 capsule    Refill:  2    Note new dose   pioglitazone (ACTOS) 15 MG tablet    Sig: Take 1 tablet (15 mg total) by mouth daily.    Dispense:  30 tablet    Refill:  6    Orders Placed This Encounter  Procedures   Flu Vaccine Trivalent High Dose (Fluad)   CBC with Differential/Platelet    Comprehensive metabolic panel   Lipase   Pancreatic Elastase, Fecal    Standing Status:   Future    Standing Expiration Date:   11/03/2023   Fecal fat, quantitative    Standing Status:   Future    Standing Expiration Date:   11/03/2023    Order Specific Question:   Total Specimen Weight (g)    Answer:   .    Order Specific Question:   Collection Duration (h)    Answer:   random   POCT glycosylated hemoglobin (Hb A1C)    Patient Instructions  We will request latest diabetic eye exam from Dr Marchelle Gearing Flu shot today Haga cita de seguimiento con gastroenterologo.  Pase por laboratorio para recoger examen de heces.  Bake omeprazole a 40mg  cada otro dia Comienze actos 15mg  diarios. Regresar en 3 meses para proxima visita  Follow up plan: Return in 3 months (on 02/03/2023), or if symptoms worsen or fail to improve, for annual exam, prior fasting for blood work.  Eustaquio Boyden, MD

## 2022-11-03 NOTE — Assessment & Plan Note (Addendum)
Chronic, deteriorated despite continuing metformin XR 1000mg  daily with breakfast.  Jardiance intolerance (weight loss, ?diarrhea).  Avoid GLP1RA in h/o recent GI difficulty.  He's hesitant to start insulin.  Start actos 15mg  daily. Update urinalysis next visit.  Reassess control at CPE in 3 months.

## 2022-11-06 ENCOUNTER — Other Ambulatory Visit: Payer: Self-pay

## 2022-11-06 DIAGNOSIS — R197 Diarrhea, unspecified: Secondary | ICD-10-CM

## 2022-11-14 LAB — FECAL FAT, QUANTITATIVE
%LIPID: 3.88 %
Specimen Total Weight: 12 g

## 2022-11-14 LAB — PANCREATIC ELASTASE, FECAL: Pancreatic Elastase-1, Stool: 467 ug/g

## 2022-12-01 HISTORY — PX: CATARACT EXTRACTION: SUR2

## 2022-12-26 DIAGNOSIS — H2512 Age-related nuclear cataract, left eye: Secondary | ICD-10-CM | POA: Diagnosis not present

## 2023-01-12 DIAGNOSIS — R1310 Dysphagia, unspecified: Secondary | ICD-10-CM | POA: Diagnosis not present

## 2023-01-12 DIAGNOSIS — K219 Gastro-esophageal reflux disease without esophagitis: Secondary | ICD-10-CM | POA: Diagnosis not present

## 2023-01-12 DIAGNOSIS — R634 Abnormal weight loss: Secondary | ICD-10-CM | POA: Diagnosis not present

## 2023-01-23 ENCOUNTER — Other Ambulatory Visit: Payer: Self-pay | Admitting: Family Medicine

## 2023-01-23 DIAGNOSIS — E1169 Type 2 diabetes mellitus with other specified complication: Secondary | ICD-10-CM

## 2023-01-27 ENCOUNTER — Other Ambulatory Visit: Payer: Self-pay | Admitting: Family Medicine

## 2023-01-27 DIAGNOSIS — E781 Pure hyperglyceridemia: Secondary | ICD-10-CM

## 2023-02-03 ENCOUNTER — Other Ambulatory Visit: Payer: Self-pay | Admitting: Family Medicine

## 2023-02-03 DIAGNOSIS — D696 Thrombocytopenia, unspecified: Secondary | ICD-10-CM

## 2023-02-03 DIAGNOSIS — E1159 Type 2 diabetes mellitus with other circulatory complications: Secondary | ICD-10-CM

## 2023-02-03 DIAGNOSIS — E1169 Type 2 diabetes mellitus with other specified complication: Secondary | ICD-10-CM

## 2023-02-06 ENCOUNTER — Other Ambulatory Visit: Payer: Self-pay | Admitting: Family Medicine

## 2023-02-06 DIAGNOSIS — I152 Hypertension secondary to endocrine disorders: Secondary | ICD-10-CM

## 2023-02-07 ENCOUNTER — Other Ambulatory Visit: Payer: 59

## 2023-02-07 DIAGNOSIS — E1169 Type 2 diabetes mellitus with other specified complication: Secondary | ICD-10-CM | POA: Diagnosis not present

## 2023-02-07 DIAGNOSIS — E1159 Type 2 diabetes mellitus with other circulatory complications: Secondary | ICD-10-CM | POA: Diagnosis not present

## 2023-02-07 DIAGNOSIS — D696 Thrombocytopenia, unspecified: Secondary | ICD-10-CM | POA: Diagnosis not present

## 2023-02-07 DIAGNOSIS — E785 Hyperlipidemia, unspecified: Secondary | ICD-10-CM | POA: Diagnosis not present

## 2023-02-07 DIAGNOSIS — I152 Hypertension secondary to endocrine disorders: Secondary | ICD-10-CM

## 2023-02-07 LAB — CBC WITH DIFFERENTIAL/PLATELET
Basophils Absolute: 0 10*3/uL (ref 0.0–0.1)
Basophils Relative: 0.8 % (ref 0.0–3.0)
Eosinophils Absolute: 0.1 10*3/uL (ref 0.0–0.7)
Eosinophils Relative: 2.2 % (ref 0.0–5.0)
HCT: 42.6 % (ref 39.0–52.0)
Hemoglobin: 14.3 g/dL (ref 13.0–17.0)
Lymphocytes Relative: 31.2 % (ref 12.0–46.0)
Lymphs Abs: 1.6 10*3/uL (ref 0.7–4.0)
MCHC: 33.5 g/dL (ref 30.0–36.0)
MCV: 92.1 fL (ref 78.0–100.0)
Monocytes Absolute: 0.4 10*3/uL (ref 0.1–1.0)
Monocytes Relative: 7.7 % (ref 3.0–12.0)
Neutro Abs: 3 10*3/uL (ref 1.4–7.7)
Neutrophils Relative %: 58.1 % (ref 43.0–77.0)
Platelets: 119 10*3/uL — ABNORMAL LOW (ref 150.0–400.0)
RBC: 4.62 Mil/uL (ref 4.22–5.81)
RDW: 13.6 % (ref 11.5–15.5)
WBC: 5.2 10*3/uL (ref 4.0–10.5)

## 2023-02-07 LAB — COMPREHENSIVE METABOLIC PANEL
ALT: 17 U/L (ref 0–53)
AST: 19 U/L (ref 0–37)
Albumin: 4.4 g/dL (ref 3.5–5.2)
Alkaline Phosphatase: 65 U/L (ref 39–117)
BUN: 19 mg/dL (ref 6–23)
CO2: 34 meq/L — ABNORMAL HIGH (ref 19–32)
Calcium: 9.3 mg/dL (ref 8.4–10.5)
Chloride: 100 meq/L (ref 96–112)
Creatinine, Ser: 0.84 mg/dL (ref 0.40–1.50)
GFR: 81.06 mL/min (ref >60.00)
Glucose, Bld: 121 mg/dL — ABNORMAL HIGH (ref 70–99)
Potassium: 4.4 meq/L (ref 3.5–5.1)
Sodium: 138 meq/L (ref 135–145)
Total Bilirubin: 0.5 mg/dL (ref 0.2–1.2)
Total Protein: 7 g/dL (ref 6.0–8.3)

## 2023-02-07 LAB — LIPID PANEL
Cholesterol: 154 mg/dL (ref 0–200)
HDL: 35.4 mg/dL — ABNORMAL LOW (ref >39.00)
LDL Cholesterol: 80 mg/dL (ref 0–99)
NonHDL: 118.21
Total CHOL/HDL Ratio: 4
Triglycerides: 191 mg/dL — ABNORMAL HIGH (ref 0.0–149.0)
VLDL: 38.2 mg/dL (ref 0.0–40.0)

## 2023-02-07 LAB — URINALYSIS, ROUTINE W REFLEX MICROSCOPIC
Bilirubin Urine: NEGATIVE
Hgb urine dipstick: NEGATIVE
Ketones, ur: NEGATIVE
Leukocytes,Ua: NEGATIVE
Nitrite: NEGATIVE
RBC / HPF: NONE SEEN (ref 0–?)
Specific Gravity, Urine: 1.01 (ref 1.000–1.030)
Total Protein, Urine: NEGATIVE
Urine Glucose: NEGATIVE
Urobilinogen, UA: 0.2 (ref 0.0–1.0)
WBC, UA: NONE SEEN (ref 0–?)
pH: 7.5 (ref 5.0–8.0)

## 2023-02-07 LAB — MICROALBUMIN / CREATININE URINE RATIO
Creatinine,U: 74 mg/dL
Microalb Creat Ratio: 0.9 mg/g (ref 0.0–30.0)
Microalb, Ur: <0.7 mg/dL (ref 0.0–1.9)

## 2023-02-07 LAB — HEMOGLOBIN A1C: Hgb A1c MFr Bld: 6.9 % — ABNORMAL HIGH (ref 4.6–6.5)

## 2023-02-07 LAB — VITAMIN B12: Vitamin B-12: 359 pg/mL (ref 211–911)

## 2023-02-14 ENCOUNTER — Encounter: Payer: Self-pay | Admitting: Family Medicine

## 2023-02-14 ENCOUNTER — Ambulatory Visit: Payer: 59 | Admitting: Family Medicine

## 2023-02-14 VITALS — BP 126/70 | HR 57 | Temp 97.7°F | Ht 64.5 in | Wt 149.5 lb

## 2023-02-14 DIAGNOSIS — K219 Gastro-esophageal reflux disease without esophagitis: Secondary | ICD-10-CM | POA: Diagnosis not present

## 2023-02-14 DIAGNOSIS — Z0000 Encounter for general adult medical examination without abnormal findings: Secondary | ICD-10-CM

## 2023-02-14 DIAGNOSIS — N401 Enlarged prostate with lower urinary tract symptoms: Secondary | ICD-10-CM

## 2023-02-14 DIAGNOSIS — E785 Hyperlipidemia, unspecified: Secondary | ICD-10-CM | POA: Diagnosis not present

## 2023-02-14 DIAGNOSIS — Z7189 Other specified counseling: Secondary | ICD-10-CM

## 2023-02-14 DIAGNOSIS — I152 Hypertension secondary to endocrine disorders: Secondary | ICD-10-CM

## 2023-02-14 DIAGNOSIS — E1169 Type 2 diabetes mellitus with other specified complication: Secondary | ICD-10-CM

## 2023-02-14 DIAGNOSIS — D696 Thrombocytopenia, unspecified: Secondary | ICD-10-CM | POA: Diagnosis not present

## 2023-02-14 DIAGNOSIS — R197 Diarrhea, unspecified: Secondary | ICD-10-CM | POA: Diagnosis not present

## 2023-02-14 DIAGNOSIS — R351 Nocturia: Secondary | ICD-10-CM

## 2023-02-14 DIAGNOSIS — E1159 Type 2 diabetes mellitus with other circulatory complications: Secondary | ICD-10-CM | POA: Diagnosis not present

## 2023-02-14 DIAGNOSIS — E781 Pure hyperglyceridemia: Secondary | ICD-10-CM | POA: Diagnosis not present

## 2023-02-14 MED ORDER — LISINOPRIL-HYDROCHLOROTHIAZIDE 20-25 MG PO TABS: 1.0000 | ORAL_TABLET | Freq: Every day | ORAL | 4 refills | Status: DC

## 2023-02-14 MED ORDER — ACCU-CHEK SOFTCLIX LANCETS MISC: 3 refills | Status: DC

## 2023-02-14 MED ORDER — ACCU-CHEK AVIVA PLUS VI STRP: ORAL_STRIP | 3 refills | Status: AC

## 2023-02-14 MED ORDER — PIOGLITAZONE HCL 15 MG PO TABS: 15.0000 mg | ORAL_TABLET | Freq: Every day | ORAL | 4 refills | Status: DC

## 2023-02-14 MED ORDER — OMEPRAZOLE 40 MG PO CPDR: 40.0000 mg | DELAYED_RELEASE_CAPSULE | Freq: Every day | ORAL | 4 refills | Status: DC

## 2023-02-14 MED ORDER — LOVASTATIN 20 MG PO TABS: ORAL_TABLET | ORAL | 4 refills | Status: DC

## 2023-02-14 MED ORDER — METFORMIN HCL ER 500 MG PO TB24: 1000.0000 mg | ORAL_TABLET | Freq: Every day | ORAL | 4 refills | Status: DC

## 2023-02-14 NOTE — Patient Instructions (Addendum)
 Gusto verlo hoy!  Su azucar esta mejor controlada.  Regresar en 6 meses para visita de diabetes.

## 2023-02-14 NOTE — Assessment & Plan Note (Signed)
 GI had recommended BID dosing however he's doing well on omeprazole  40mg  daily - continue.

## 2023-02-14 NOTE — Assessment & Plan Note (Signed)
 Chronic, stable on current regimen - continue.

## 2023-02-14 NOTE — Assessment & Plan Note (Signed)
 Stable period off medication - he was previously on terazosin .

## 2023-02-14 NOTE — Progress Notes (Signed)
 Ph: [phone] Fax: [phone]   Patient ID: Ricky Garrett, male    DOB: [birth year], 83 y.o.   MRN: [id number]  This visit was conducted in person.  BP 126/70   Pulse (!) 57   Temp 97.7 F (36.5 C) (Oral)   Ht 5' 4.5" (1.638 m)   Wt 149 lb 8 oz (67.8 kg)   SpO2 99%   BMI 25.27 kg/m    CC: CPE Subjective:   HPI: Ricky Garrett is a 83 y.o. male presenting on 02/14/2023 for Annual Exam (MCR prt 2 [AWV- 08/01/22]. Pt accompanied by daughter, Nicholes Barks. )   Saw health advisor 07/2022 for medicare wellness visit. Note reviewed.   No results found.  Flowsheet Row Office Visit from 11/03/2022 in [facility] [place]  PHQ-2 Total Score 0          08/01/2022   10:50 AM 07/26/2021   11:25 AM 07/15/2020    3:31 PM 12/23/2018    4:04 PM 12/20/2018   12:04 PM  Fall Risk   Falls in the past year? 0 0 0 0 0  Comment     Emmi Telephone Survey: data to providers prior to load  Number falls in past yr: 0 0 0    Injury with Fall? 0 0 0    Risk for fall due to : Medication side effect No Fall Risks Medication side effect    Follow up Falls prevention discussed;Falls evaluation completed Falls prevention discussed Falls evaluation completed;Falls prevention discussed        Saw [facility] Dr Kimble Pennant for diarrhea and GERD/dysphagia s/p CT scan 10/2021 - no obstruction, there were fatty liver and kidney cysts and enlarged prostate. EGD showed significant GERD with mild diffuse esophageal dysmotility. Latest visit 12/2022 - continued omeprazole  40mg  BID, add IBGard BID PRN - attributed symptoms to metformin . Fecal fat, pancreatic elastase normal. IBGard pills have helped.   DM - not checking sugars. Off Jardiance  - may have contributed to weight loss. Continues metformin  XR 1000mg  daily. Actos  15mg  started 10/2022. Recent UA normal.  Lab Results  Component Value Date   HGBA1C 6.9 (H) 02/07/2023     Preventative: COLONOSCOPY 01/2014 - diverticulosis, 2 polyps,  hemorrhoids, rpt 5 yrs (Outlaw)  Virtual colonoscopy 07/2019 - large amt retained barium througout colon, no polyps or masses. Incidental 2.4cm hypodensity to R kidney likely cyst.  Prostate cancer screening - known BPH on terazosin  - stopped 2023. Aged out of prostate cancer screening. Nocturia x2.  Lung cancer screening - not eligible  Flu shot - yearly COVID vaccine Pfizer 01/2019, 03/2019, booster x2 10/2019, 06/2020  Tdap 2017 Pneumovax 2017, prevnar-13 2020 Zostavax - 01/2017 - may have received expired Shingrix - 12/2018, 03/2019  Advanced directive discussion - does not have set up. Unsure who would be HCPOA. Packet previously provided. They will continue working on this.  Seat belt use discussed  Sunscreen use discussed. No new moles on skin. Sees derm yearly  Ex smoker - quit 1990  Alcohol  - rare  Dentist - yearly  Eye exam - yearly Bowel - no constipation  Bladder - no incontinence  Lives with daughter Nicholes Barks) and her husband, 2 grand children, and wife Moved from [place] to the [place]  (2008). Was living in [place] and moved to [place] first and then to [facility] (2008).  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 Cares for grandchildren. Edu - elementary Living in [place] with wife and daughter's family. Activity:  walking regularly Diet: good water, fruits/vegetables daily      Relevant past medical, surgical, family and social history reviewed and updated as indicated. Interim medical history since our last visit reviewed. Allergies and medications reviewed and updated. Outpatient Medications Prior to Visit  Medication Sig Dispense Refill   aspirin  EC 81 MG tablet Take 1 tablet (81 mg total) by mouth every Monday, Wednesday, and Friday.     Multiple Vitamins-Minerals (CENTRUM SILVER ) tablet Take 1 tablet by mouth daily.     Peppermint Oil (IBGARD PO) Take by mouth as needed.     Accu-Chek Softclix Lancets lancets Use as instructed to check sugars twice daily E11.69  200 each 3   glucose blood (ACCU-CHEK AVIVA PLUS) test strip Use as instructed to check sugars twice daily E11.69 200 strip 3   lisinopril -hydrochlorothiazide  (ZESTORETIC ) 20-25 MG tablet TAKE 1 TABLET BY MOUTH EVERY DAY 90 tablet 1   lovastatin  (MEVACOR ) 20 MG tablet TAKE 1 TABLET NIGHTLY FOR CHOLESTEROL 90 tablet 0   metFORMIN  (GLUCOPHAGE -XR) 500 MG 24 hr tablet TAKE 2 TABLETS BY MOUTH EVERY DAY WITH BREAKFAST 180 tablet 0   omeprazole  (PRILOSEC) 40 MG capsule Take 1 capsule (40 mg total) by mouth every other day. 45 capsule 2   pioglitazone  (ACTOS ) 15 MG tablet Take 1 tablet (15 mg total) by mouth daily. 30 tablet 6   No facility-administered medications prior to visit.     Per HPI unless specifically indicated in ROS section below Review of Systems  Constitutional:  Negative for activity change, appetite change, chills, fatigue, fever and unexpected weight change.  HENT:  Negative for hearing loss.   Eyes:  Negative for visual disturbance.  Respiratory:  Negative for cough, chest tightness, shortness of breath and wheezing.   Cardiovascular:  Negative for chest pain, palpitations and leg swelling.  Gastrointestinal:  Positive for abdominal pain (IBGard has helped) and diarrhea (occ loose stools - about once a week). Negative for abdominal distention, blood in stool, constipation, nausea and vomiting.  Genitourinary:  Negative for difficulty urinating and hematuria.  Musculoskeletal:  Negative for arthralgias, myalgias and neck pain.  Skin:  Negative for rash.  Neurological:  Positive for headaches (occ). Negative for dizziness, seizures and syncope.  Hematological:  Negative for adenopathy. Does not bruise/bleed easily.  Psychiatric/Behavioral:  Negative for dysphoric mood. The patient is not nervous/anxious.     Objective:  BP 126/70   Pulse (!) 57   Temp 97.7 F (36.5 C) (Oral)   Ht 5' 4.5" (1.638 m)   Wt 149 lb 8 oz (67.8 kg)   SpO2 99%   BMI 25.27 kg/m   Wt Readings from  Last 3 Encounters:  02/14/23 149 lb 8 oz (67.8 kg)  11/03/22 146 lb 6 oz (66.4 kg)  08/01/22 152 lb (68.9 kg)      Physical Exam Vitals and nursing note reviewed.  Constitutional:      General: He is not in acute distress.    Appearance: Normal appearance. He is well-developed. He is not ill-appearing.  HENT:     Head: Normocephalic and atraumatic.     Right Ear: Hearing, tympanic membrane, ear canal and external ear normal.     Left Ear: Hearing, tympanic membrane, ear canal and external ear normal.     Mouth/Throat:     Mouth: Mucous membranes are moist.     Pharynx: Oropharynx is clear. No oropharyngeal exudate or posterior oropharyngeal erythema.  Eyes:     General: No scleral icterus.  Extraocular Movements: Extraocular movements intact.     Conjunctiva/sclera: Conjunctivae normal.     Pupils: Pupils are equal, round, and reactive to light.  Neck:     Thyroid : No thyroid  mass or thyromegaly.     Vascular: No carotid bruit.  Cardiovascular:     Rate and Rhythm: Normal rate and regular rhythm.     Pulses: Normal pulses.          Radial pulses are 2+ on the right side and 2+ on the left side.     Heart sounds: Normal heart sounds. No murmur heard. Pulmonary:     Effort: Pulmonary effort is normal. No respiratory distress.     Breath sounds: Normal breath sounds. No wheezing, rhonchi or rales.  Abdominal:     General: Bowel sounds are normal. There is no distension.     Palpations: Abdomen is soft. There is no mass.     Tenderness: There is no abdominal tenderness. There is no guarding or rebound.     Hernia: No hernia is present.  Musculoskeletal:        General: Normal range of motion.     Cervical back: Normal range of motion and neck supple.     Right lower leg: No edema.     Left lower leg: No edema.  Lymphadenopathy:     Cervical: No cervical adenopathy.  Skin:    General: Skin is warm and dry.     Findings: No rash.  Neurological:     General: No focal  deficit present.     Mental Status: He is alert and oriented to person, place, and time.  Psychiatric:        Mood and Affect: Mood normal.        Behavior: Behavior normal.        Thought Content: Thought content normal.        Judgment: Judgment normal.       Results for orders placed or performed in visit on 02/07/23  Urinalysis, Routine w reflex microscopic   Collection Time: 02/07/23 10:02 AM  Result Value Ref Range   Color, Urine YELLOW Yellow;Lt. Yellow;Straw;Dark Yellow;Amber;Green;Red;Brown   APPearance CLEAR Clear;Turbid;Slightly Cloudy;Cloudy   Specific Gravity, Urine 1.010 1.000 - 1.030   pH 7.5 5.0 - 8.0   Total Protein, Urine NEGATIVE Negative   Urine Glucose NEGATIVE Negative   Ketones, ur NEGATIVE Negative   Bilirubin Urine NEGATIVE Negative   Hgb urine dipstick NEGATIVE Negative   Urobilinogen, UA 0.2 0.0 - 1.0   Leukocytes,Ua NEGATIVE Negative   Nitrite NEGATIVE Negative   WBC, UA none seen 0-2/hpf   RBC / HPF none seen 0-2/hpf  Vitamin B12   Collection Time: 02/07/23 10:02 AM  Result Value Ref Range   Vitamin B-12 359 211 - 911 pg/mL  CBC with Differential/Platelet   Collection Time: 02/07/23 10:02 AM  Result Value Ref Range   WBC 5.2 4.0 - 10.5 K/uL   RBC 4.62 4.22 - 5.81 Mil/uL   Hemoglobin 14.3 13.0 - 17.0 g/dL   HCT 78.2 95.6 - 21.3 %   MCV 92.1 78.0 - 100.0 fl   MCHC 33.5 30.0 - 36.0 g/dL   RDW 08.6 57.8 - 46.9 %   Platelets 119.0 (L) 150.0 - 400.0 K/uL   Neutrophils Relative % 58.1 43.0 - 77.0 %   Lymphocytes Relative 31.2 12.0 - 46.0 %   Monocytes Relative 7.7 3.0 - 12.0 %   Eosinophils Relative 2.2 0.0 - 5.0 %  Basophils Relative 0.8 0.0 - 3.0 %   Neutro Abs 3.0 1.4 - 7.7 K/uL   Lymphs Abs 1.6 0.7 - 4.0 K/uL   Monocytes Absolute 0.4 0.1 - 1.0 K/uL   Eosinophils Absolute 0.1 0.0 - 0.7 K/uL   Basophils Absolute 0.0 0.0 - 0.1 K/uL  Comprehensive metabolic panel   Collection Time: 02/07/23 10:02 AM  Result Value Ref Range   Sodium 138  135 - 145 mEq/L   Potassium 4.4 3.5 - 5.1 mEq/L   Chloride 100 96 - 112 mEq/L   CO2 34 (H) 19 - 32 mEq/L   Glucose, Bld 121 (H) 70 - 99 mg/dL   BUN 19 6 - 23 mg/dL   Creatinine, Ser 1.61 0.40 - 1.50 mg/dL   Total Bilirubin 0.5 0.2 - 1.2 mg/dL   Alkaline Phosphatase 65 39 - 117 U/L   AST 19 0 - 37 U/L   ALT 17 0 - 53 U/L   Total Protein 7.0 6.0 - 8.3 g/dL   Albumin 4.4 3.5 - 5.2 g/dL   GFR 09.60 >45.40 mL/min   Calcium 9.3 8.4 - 10.5 mg/dL  Lipid panel   Collection Time: 02/07/23 10:02 AM  Result Value Ref Range   Cholesterol 154 0 - 200 mg/dL   Triglycerides 981.1 (H) 0.0 - 149.0 mg/dL   HDL 91.47 (L) >82.95 mg/dL   VLDL 62.1 0.0 - 30.8 mg/dL   LDL Cholesterol 80 0 - 99 mg/dL   Total CHOL/HDL Ratio 4    NonHDL 118.21   Microalbumin / creatinine urine ratio   Collection Time: 02/07/23 10:02 AM  Result Value Ref Range   Microalb, Ur <0.7 0.0 - 1.9 mg/dL   Creatinine,U 65.7 mg/dL   Microalb Creat Ratio 0.9 0.0 - 30.0 mg/g  Hemoglobin A1c   Collection Time: 02/07/23 10:02 AM  Result Value Ref Range   Hgb A1c MFr Bld 6.9 (H) 4.6 - 6.5 %    Assessment & Plan:   Problem List Items Addressed This Visit     Health maintenance examination - Primary (Chronic)   Preventative protocols reviewed and updated unless pt declined. Discussed healthy diet and lifestyle.       Advanced care planning/counseling discussion (Chronic)   Previously discussed. They continue working on this.       Dyslipidemia associated with type 2 diabetes mellitus (HCC)   Chronic, continue lovastatin .  The ASCVD Risk score (Arnett DK, et al., 2019) failed to calculate for the following reasons:   The 2019 ASCVD risk score is only valid for ages 21 to 58       Relevant Medications   lovastatin  (MEVACOR ) 20 MG tablet   metFORMIN  (GLUCOPHAGE -XR) 500 MG 24 hr tablet   lisinopril -hydrochlorothiazide  (ZESTORETIC ) 20-25 MG tablet   pioglitazone  (ACTOS ) 15 MG tablet   Thrombocytopenia (HCC)   Chronic,  stable. Anticipate ITP - will continue to monitor. H/o heme eval.       Hypertension associated with diabetes (HCC)   Chronic, stable on current regimen - continue.      Relevant Medications   lovastatin  (MEVACOR ) 20 MG tablet   metFORMIN  (GLUCOPHAGE -XR) 500 MG 24 hr tablet   lisinopril -hydrochlorothiazide  (ZESTORETIC ) 20-25 MG tablet   pioglitazone  (ACTOS ) 15 MG tablet   GERD (gastroesophageal reflux disease)   GI had recommended BID dosing however he's doing well on omeprazole  40mg  daily - continue.       Relevant Medications   omeprazole  (PRILOSEC) 40 MG capsule   Type 2 diabetes mellitus with  other specified complication (HCC)   Chronic, improved control since adding actos  15mg  - continue this and metformin  XR 1000mg  daily.       Relevant Medications   glucose blood (ACCU-CHEK AVIVA PLUS) test strip   lovastatin  (MEVACOR ) 20 MG tablet   metFORMIN  (GLUCOPHAGE -XR) 500 MG 24 hr tablet   lisinopril -hydrochlorothiazide  (ZESTORETIC ) 20-25 MG tablet   pioglitazone  (ACTOS ) 15 MG tablet   BPH (benign prostatic hyperplasia)   Stable period off medication - he was previously on terazosin .       Hypertriglyceridemia   Mildly elevated - see below.        Relevant Medications   lovastatin  (MEVACOR ) 20 MG tablet   lisinopril -hydrochlorothiazide  (ZESTORETIC ) 20-25 MG tablet   Diarrhea   Overall reassuring evaluation  Appreciate GI care.  GI upset better with IBGard  GI thought metformin  may have contributed to upset stomach - he continues XR 1000mg  daily.         Meds ordered this encounter  Medications   glucose blood (ACCU-CHEK AVIVA PLUS) test strip    Sig: Use as instructed to check sugars twice daily E11.69    Dispense:  200 strip    Refill:  3   lovastatin  (MEVACOR ) 20 MG tablet    Sig: Take 1 tablet nightly for cholesterol    Dispense:  90 tablet    Refill:  4   metFORMIN  (GLUCOPHAGE -XR) 500 MG 24 hr tablet    Sig: Take 2 tablets (1,000 mg total) by mouth daily  with breakfast.    Dispense:  180 tablet    Refill:  4   lisinopril -hydrochlorothiazide  (ZESTORETIC ) 20-25 MG tablet    Sig: Take 1 tablet by mouth daily.    Dispense:  90 tablet    Refill:  4   omeprazole  (PRILOSEC) 40 MG capsule    Sig: Take 1 capsule (40 mg total) by mouth daily.    Dispense:  90 capsule    Refill:  4   pioglitazone  (ACTOS ) 15 MG tablet    Sig: Take 1 tablet (15 mg total) by mouth daily.    Dispense:  90 tablet    Refill:  4   Accu-Chek Softclix Lancets lancets    Sig: Use as instructed to check sugars twice daily E11.69    Dispense:  200 each    Refill:  3    No orders of the defined types were placed in this encounter.   Patient Instructions  Gusto verlo hoy!  Su azucar esta mejor controlada.  Regresar en 6 meses para visita de diabetes.   Follow up plan: Return in about 6 months (around 08/14/2023) for follow up visit.  Claire Crick, MD

## 2023-02-14 NOTE — Assessment & Plan Note (Addendum)
 Mildly elevated - see below.

## 2023-02-14 NOTE — Assessment & Plan Note (Addendum)
 Chronic, improved control since adding actos  15mg  - continue this and metformin  XR 1000mg  daily.

## 2023-02-14 NOTE — Assessment & Plan Note (Signed)
 Overall reassuring evaluation  Appreciate GI care.  GI upset better with IBGard  GI thought metformin  may have contributed to upset stomach - he continues XR 1000mg  daily.

## 2023-02-14 NOTE — Assessment & Plan Note (Signed)
 Preventative protocols reviewed and updated unless pt declined. Discussed healthy diet and lifestyle.

## 2023-02-14 NOTE — Assessment & Plan Note (Signed)
 Chronic, stable. Anticipate ITP - will continue to monitor. H/o heme eval.

## 2023-02-14 NOTE — Assessment & Plan Note (Addendum)
 Previously discussed. They continue working on this.

## 2023-02-14 NOTE — Assessment & Plan Note (Signed)
 Chronic, continue lovastatin. The ASCVD Risk score (Arnett DK, et al., 2019) failed to calculate for the following reasons:   The 2019 ASCVD risk score is only valid for ages 75 to 55

## 2023-05-24 ENCOUNTER — Encounter: Payer: Self-pay | Admitting: Family Medicine

## 2023-08-02 ENCOUNTER — Ambulatory Visit: Payer: 59

## 2023-08-02 VITALS — Ht 67.0 in | Wt 149.0 lb

## 2023-08-02 DIAGNOSIS — Z0000 Encounter for general adult medical examination without abnormal findings: Secondary | ICD-10-CM | POA: Diagnosis not present

## 2023-08-02 NOTE — Progress Notes (Signed)
 Please attest and cosign this visit due to patients primary care provider not being in the office at the time the visit was completed.    Subjective:   Ricky Garrett is a 83 y.o. who presents for a Medicare Wellness preventive visit.  As a reminder, Annual Wellness Visits don't include a physical exam, and some assessments may be limited, especially if this visit is performed virtually. We may recommend an in-person follow-up visit with your provider if needed.  Visit Complete: Virtual I connected with  Ricky Garrett on 08/02/23 by a audio enabled telemedicine application and verified that I am speaking with the correct person using two identifiers.  Patient Location: Home  Provider Location: Home Office  I discussed the limitations of evaluation and management by telemedicine. The patient expressed understanding and agreed to proceed.  Vital Signs: Because this visit was a virtual/telehealth visit, some criteria may be missing or patient reported. Any vitals not documented were not able to be obtained and vitals that have been documented are patient reported.  VideoDeclined- This patient declined Librarian, academic. Therefore the visit was completed with audio only.  Persons Participating in Visit: Patient assisted by daughter/caretaker, [name].  AWV Questionnaire: No: Patient Medicare AWV questionnaire was not completed prior to this visit.  Cardiac Risk Factors include: advanced age (>32men, >16 women);diabetes mellitus;dyslipidemia;hypertension;male gender     Objective:    Today's Vitals   08/02/23 1057  Weight: 149 lb (67.6 kg)  Height: 5' 7 (1.702 m)   Body mass index is 23.34 kg/m.     08/02/2023   11:08 AM 08/01/2022   10:50 AM 07/26/2021   11:25 AM 07/15/2020    3:31 PM 07/04/2016    9:43 AM 07/02/2015    2:07 PM 05/14/2015   12:24 PM  Advanced Directives  Does Patient Have a Medical Advance Directive? No No Yes No No  No  Yes   Type of  Surveyor, minerals;Living will    Living will;Healthcare Power of Attorney   Does patient want to make changes to medical advance directive?       No - Patient declined   Copy of Healthcare Power of Attorney in Chart?   No - copy requested    No - copy requested   Would patient like information on creating a medical advance directive?    No - Patient declined No - Patient declined  No - patient declined information       Data saved with a previous flowsheet row definition    Current Medications (verified) Outpatient Encounter Medications as of 08/02/2023  Medication Sig   Accu-Chek Softclix Lancets lancets Use as instructed to check sugars twice daily E11.69   aspirin  EC 81 MG tablet Take 1 tablet (81 mg total) by mouth every Monday, Wednesday, and Friday.   glucose blood (ACCU-CHEK AVIVA PLUS) test strip Use as instructed to check sugars twice daily E11.69   lisinopril -hydrochlorothiazide  (ZESTORETIC ) 20-25 MG tablet Take 1 tablet by mouth daily.   lovastatin  (MEVACOR ) 20 MG tablet Take 1 tablet nightly for cholesterol   metFORMIN  (GLUCOPHAGE -XR) 500 MG 24 hr tablet Take 2 tablets (1,000 mg total) by mouth daily with breakfast.   Multiple Vitamins-Minerals (CENTRUM SILVER ) tablet Take 1 tablet by mouth daily.   omeprazole  (PRILOSEC) 40 MG capsule Take 1 capsule (40 mg total) by mouth daily.   Peppermint Oil (IBGARD PO) Take by mouth as needed.   pioglitazone  (ACTOS ) 15 MG tablet  Take 1 tablet (15 mg total) by mouth daily.   No facility-administered encounter medications on file as of 08/02/2023.    Allergies (verified) Patient has no known allergies.   History: Past Medical History:  Diagnosis Date   Cancer of skin of left ear 2019   seen by a dermatologist in [facility]   Decreased hearing    pt declines   Diabetes mellitus without complication (HCC)    GERD (gastroesophageal reflux disease)    History of stomach ulcers    Hyperlipidemia     Hypertension    Pigmented skin lesion 03/2020   R occipital scalp - pigmented lichen planus-like keratosis ([place])   Rhinitis, allergic    Thrombocytopenia (HCC)    Past Surgical History:  Procedure Laterality Date   CATARACT EXTRACTION Right 10/2022   CATARACT EXTRACTION Left 12/2022   COLONOSCOPY  2011   1 polyp (outlaw)   COLONOSCOPY  01/2014   diverticulosis, 2 polyps, hemorrhoids, rpt 5 yrs (Outlaw)   NASAL SEPTUM SURGERY  1990   Family History  Problem Relation Age of Onset   CAD Neg Hx    Stroke Neg Hx    Diabetes Neg Hx    Cancer Neg Hx    Social History   Socioeconomic History   Marital status: Married    Spouse name: [name]   Number of children: Not on file   Years of education: Not on file   Highest education level: 5th grade  Occupational History    Employer: [employer]  Tobacco Use   Smoking status: Former    Current packs/day: 0.00    Average packs/day: 0.5 packs/day for 1 year (0.5 ttl pk-yrs)    Types: Cigarettes    Start date: 53    Quit date: 1990    Years since quitting: 35.5   Smokeless tobacco: Never  Vaping Use   Vaping status: Never Used  Substance and Sexual Activity   Alcohol use: Not Currently    Alcohol/week: 1.0 standard drink of alcohol    Types: 1 Cans of beer per week   Drug use: No   Sexual activity: Not Currently    Partners: Female    Birth control/protection: None  Other Topics Concern   Not on file  Social History Narrative   Lives with daughter [name]) and her husband, 2 grand children, and wife    [place]oved from [place]).    Was living in [place] and moved to [place] first and then to [facility] (2008).    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 Cares for grandchildren.    Edu - 5th grade    Living in [place] with wife and daughter's family.    Activity: walking regularly   Diet: good water, fruits/vegetables daily    Social Drivers of Health   Financial Resource  Strain: Low Risk  (08/02/2023)   Overall Financial Resource Strain (CARDIA)    Difficulty of Paying Living Expenses: Not hard at all  Food Insecurity: No Food Insecurity (08/02/2023)   Hunger Vital Sign    Worried About Running Out of Food in the Last Year: Never true    Ran Out of Food in the Last Year: Never true  Transportation Needs: No Transportation Needs (08/02/2023)   PRAPARE - Administrator, Civil Service (Medical): No    Lack of Transportation (Non-Medical): No  Physical Activity: Sufficiently Active (08/02/2023)   Exercise Vital Sign    Days  of Exercise per Week: 3 days    Minutes of Exercise per Session: 50 min  Stress: No Stress Concern Present (08/02/2023)   [employer] of Occupational Health - Occupational Stress Questionnaire    Feeling of Stress: Not at all  Social Connections: Moderately Integrated (08/02/2023)   Social Connection and Isolation Panel    Frequency of Communication with Friends and Family: More than three times a week    Frequency of Social Gatherings with Friends and Family: Once a week    Attends Religious Services: 1 to 4 times per year    Active Member of [employer] or Organizations: No    Attends Engineer, structural: Never    Marital Status: Married    Tobacco Counseling Counseling given: Not Answered    Clinical Intake:  Pre-visit preparation completed: Yes  Pain : No/denies pain    BMI - recorded: 23.34 Nutritional Status: BMI of 19-24  Normal Nutritional Risks: None Diabetes: Yes CBG done?: No Did pt. bring in CBG monitor from home?: No  Lab Results  Component Value Date   HGBA1C 6.9 (H) 02/07/2023   HGBA1C 8.8 (A) 11/03/2022   HGBA1C 7.5 (A) 06/05/2022     How often do you need to have someone help you when you read instructions, pamphlets, or other written materials from your doctor or pharmacy?: 1 - Never  Interpreter Needed?: No  Comments: lives with daughter Information entered by ::  B.Delta Deshmukh,LPN   Activities of Daily Living     08/02/2023   11:08 AM  In your present state of health, do you have any difficulty performing the following activities:  Hearing? 0  Vision? 0  Difficulty concentrating or making decisions? 0  Walking or climbing stairs? 0  Dressing or bathing? 0  Doing errands, shopping? 0  Preparing Food and eating ? N  Using the Toilet? N  In the past six months, have you accidently leaked urine? N  Do you have problems with loss of bowel control? N  Managing your Medications? Y  Managing your Finances? Y  Comment helps him  Housekeeping or managing your Housekeeping? N    Patient Care Team: Rilla Baller, MD as PCP - General (Family Medicine) Lonn Hicks, MD as Consulting Physician (Hematology and Oncology) Fate Morna SAILOR, [facility] (Inactive) as Pharmacist (Pharmacist) [facility]  I have updated your Care Teams any recent Medical Services you may have received from other providers in the past year.     Assessment:   This is a routine wellness examination for [place].  Hearing/Vision screen Hearing Screening - Comments:: Pt daughter says he hears well Vision Screening - Comments:: Pt says pt vision is good and readers only [facility]   Goals Addressed             This Visit's Progress    Patient Stated   On track    08/02/23- I will continue to walk 2 days a week for 30 minutes.      Patient Stated   On track    08/02/2023, maintain current health, wants to get off medication       Depression Screen     08/02/2023   11:05 AM 11/03/2022    8:55 AM 08/01/2022   10:51 AM 06/05/2022    2:38 PM 07/26/2021   11:23 AM 07/15/2020    3:31 PM 12/23/2018    4:05 PM  PHQ 2/9 Scores  PHQ - 2 Score 0 0 0 1 0 0 0  PHQ- 9 Score   1 4  0     Fall Risk     08/02/2023   11:01 AM 08/01/2022   10:50 AM 07/26/2021   11:25 AM 07/15/2020    3:31 PM 12/23/2018    4:04 PM  Fall Risk   Falls in the past year? 0 0 0 0 0   Number  falls in past yr: 0 0 0 0   Injury with Fall? 0 0 0 0   Risk for fall due to : No Fall Risks Medication side effect No Fall Risks Medication side effect   Follow up Education provided;Falls prevention discussed Falls prevention discussed;Falls evaluation completed Falls prevention discussed  Falls evaluation completed;Falls prevention discussed       Data saved with a previous flowsheet row definition    MEDICARE RISK AT HOME:  Medicare Risk at Home Any stairs in or around the home?: Yes If so, are there any without handrails?: Yes Home free of loose throw rugs in walkways, pet beds, electrical cords, etc?: Yes Adequate lighting in your home to reduce risk of falls?: Yes Life alert?: No Use of a cane, walker or w/c?: No Grab bars in the bathroom?: Yes Shower chair or bench in shower?: No Elevated toilet seat or a handicapped toilet?: No  TIMED UP AND GO:  Was the test performed?  No  Cognitive Function: Unable: Due to language barrier, hearing or vision limitations or other: pt left (daughter says he is never still)    08/02/2023   11:13 AM 07/15/2020    3:46 PM  MMSE - Mini Mental State Exam  Not completed: Unable to complete Unable to complete        Immunizations Immunization History  Administered Date(s) Administered   Fluad Quad(high Dose 65+) 11/01/2021   Fluad Trivalent(High Dose 65+) 11/03/2022   Influenza Inj Mdck Quad Pf 10/30/2017   Influenza Split 11/14/2010   Influenza Whole 11/29/2007, 11/09/2008, 11/04/2010   Influenza, High Dose Seasonal PF 10/20/2014, 11/09/2015, 10/24/2018, 10/23/2019, 10/21/2020   Influenza,inj,Quad PF,6+ Mos 09/24/2012   Influenza-Unspecified 10/30/2013, 02/27/2016, 10/10/2016   PFIZER(Purple Top)SARS-COV-2 Vaccination 02/13/2019, 03/06/2019, 11/12/2019, 07/06/2020   Pneumococcal Conjugate-13 08/30/2018   Pneumococcal Polysaccharide-23 10/14/2009, 01/05/2016   Td 03/30/2005   Tdap 01/05/2016   Zoster Recombinant(Shingrix)  01/09/2019, 04/04/2019   Zoster, Live 02/23/2017    Screening Tests Health Maintenance  Topic Date Due   Diabetic kidney evaluation - Urine ACR  Never done   Colonoscopy  08/21/2022   COVID-19 Vaccine (5 - 2024-25 season) 10/01/2022   FOOT EXAM  06/05/2023   HEMOGLOBIN A1C  08/07/2023   INFLUENZA VACCINE  08/31/2023   OPHTHALMOLOGY EXAM  09/29/2023   Diabetic kidney evaluation - eGFR measurement  02/07/2024   Medicare Annual Wellness (AWV)  08/01/2024   DTaP/Tdap/Td (3 - Td or Tdap) 01/04/2026   Pneumococcal Vaccine: 50+ Years  Completed   Zoster Vaccines- Shingrix  Completed   Hepatitis B Vaccines  Aged Out   HPV VACCINES  Aged Out   Meningococcal B Vaccine  Aged Out   Hepatitis C Screening  Discontinued    Health Maintenance  Health Maintenance Due  Topic Date Due   Diabetic kidney evaluation - Urine ACR  Never done   Colonoscopy  08/21/2022   COVID-19 Vaccine (5 - 2024-25 season) 10/01/2022   FOOT EXAM  06/05/2023   Health Maintenance Items Addressed: None at this time  Additional Screening:  Vision Screening: Recommended annual ophthalmology exams for early detection of glaucoma  and other disorders of the eye. Would you like a referral to an eye doctor? No    Dental Screening: Recommended annual dental exams for proper oral hygiene  Community Resource Referral / Chronic Care Management: CRR required this visit?  No   CCM required this visit?  No   Plan:    I have personally reviewed and noted the following in the patient's chart:   Medical and social history Use of alcohol, tobacco or illicit drugs  Current medications and supplements including opioid prescriptions. Patient is not currently taking opioid prescriptions. Functional ability and status Nutritional status Physical activity Advanced directives List of other physicians Hospitalizations, surgeries, and ER visits in previous 12 months Vitals Screenings to include cognitive, depression, and  falls Referrals and appointments  In addition, I have reviewed and discussed with patient certain preventive protocols, quality metrics, and best practice recommendations. A written personalized care plan for preventive services as well as general preventive health recommendations were provided to patient.   Erminio LITTIE Saris, LPN   03/07/7972   After Visit Summary: (MyChart) Due to this being a telephonic visit, the after visit summary with patients personalized plan was offered to patient via MyChart   Notes: Nothing significant to report at this time.

## 2023-08-02 NOTE — Patient Instructions (Signed)
 Ricky Garrett , Thank you for taking time out of your busy schedule to complete your Annual Wellness Visit with me. I enjoyed our conversation and look forward to speaking with you again next year. I, as well as your care team,  appreciate your ongoing commitment to your health goals. Please review the following plan we discussed and let me know if I can assist you in the future. Your Game plan/ To Do List     Follow up Visits: Next Medicare AWV with our clinical staff: 08/04/24 @ 10:10am televisit   Have you seen your provider in the last 6 months (3 months if uncontrolled diabetes)? Yes Next Office Visit with your provider: 08/10/23 @ 11:30am   Clinician Recommendations:  Aim for 30 minutes of exercise or brisk walking, 6-8 glasses of water, and 5 servings of fruits and vegetables each day.       This is a list of the screening recommended for you and due dates:  Health Maintenance  Topic Date Due   Yearly kidney health urinalysis for diabetes  Never done   Colon Cancer Screening  08/21/2022   COVID-19 Vaccine (5 - 2024-25 season) 10/01/2022   Complete foot exam   06/05/2023   Hemoglobin A1C  08/07/2023   Flu Shot  08/31/2023   Eye exam for diabetics  09/29/2023   Yearly kidney function blood test for diabetes  02/07/2024   Medicare Annual Wellness Visit  08/01/2024   DTaP/Tdap/Td vaccine (3 - Td or Tdap) 01/04/2026   Pneumococcal Vaccine for age over 35  Completed   Zoster (Shingles) Vaccine  Completed   Hepatitis B Vaccine  Aged Out   HPV Vaccine  Aged Out   Meningitis B Vaccine  Aged Out   Hepatitis C Screening  Discontinued    Advanced directives: (Declined) Advance directive discussed with you today. Even though you declined this today, please call our office should you change your mind, and we can give you the proper paperwork for you to fill out. Advance Care Planning is important because it:  [x]  Makes sure you receive the medical care that is consistent with your values,  goals, and preferences  [x]  It provides guidance to your family and loved ones and reduces their decisional burden about whether or not they are making the right decisions based on your wishes.  Follow the link provided in your after visit summary or read over the paperwork we have mailed to you to help you started getting your Advance Directives in place. If you need assistance in completing these, please reach out to us  so that we can help you!

## 2023-08-10 ENCOUNTER — Ambulatory Visit (INDEPENDENT_AMBULATORY_CARE_PROVIDER_SITE_OTHER): Admitting: Family Medicine

## 2023-08-10 ENCOUNTER — Ambulatory Visit: Payer: Self-pay | Admitting: Family Medicine

## 2023-08-10 VITALS — BP 118/72 | HR 64 | Temp 98.1°F | Ht 67.0 in | Wt 148.4 lb

## 2023-08-10 DIAGNOSIS — R300 Dysuria: Secondary | ICD-10-CM

## 2023-08-10 DIAGNOSIS — R351 Nocturia: Secondary | ICD-10-CM

## 2023-08-10 DIAGNOSIS — E1169 Type 2 diabetes mellitus with other specified complication: Secondary | ICD-10-CM

## 2023-08-10 DIAGNOSIS — N401 Enlarged prostate with lower urinary tract symptoms: Secondary | ICD-10-CM

## 2023-08-10 DIAGNOSIS — Z7984 Long term (current) use of oral hypoglycemic drugs: Secondary | ICD-10-CM

## 2023-08-10 LAB — POCT GLYCOSYLATED HEMOGLOBIN (HGB A1C): Hemoglobin A1C: 7.1 % — AB (ref 4.0–5.6)

## 2023-08-10 LAB — POC URINALSYSI DIPSTICK (AUTOMATED)
Bilirubin, UA: NEGATIVE
Blood, UA: NEGATIVE
Glucose, UA: NEGATIVE
Ketones, UA: NEGATIVE
Leukocytes, UA: NEGATIVE
Nitrite, UA: NEGATIVE
Protein, UA: NEGATIVE
Spec Grav, UA: 1.015 (ref 1.010–1.025)
Urobilinogen, UA: 0.2 U/dL
pH, UA: 6 (ref 5.0–8.0)

## 2023-08-10 MED ORDER — TAMSULOSIN HCL 0.4 MG PO CAPS: 0.4000 mg | ORAL_CAPSULE | Freq: Every day | ORAL | 11 refills | Status: DC

## 2023-08-10 MED ORDER — METFORMIN HCL ER 500 MG PO TB24: 500.0000 mg | ORAL_TABLET | Freq: Every day | ORAL | 1 refills | Status: DC

## 2023-08-10 MED ORDER — METFORMIN HCL ER 500 MG PO TB24
500.0000 mg | ORAL_TABLET | Freq: Every day | ORAL | 1 refills | Status: DC
Start: 2023-08-10 — End: 2023-08-10

## 2023-08-10 NOTE — Patient Instructions (Addendum)
 Urinalysis today  Comienze flomax  para prostata.  Tome metformina XR diariamente de noche  Azucar sigue bien - siga actos  tambien. Regresar en 6 meses para proximo examen fisico

## 2023-08-10 NOTE — Progress Notes (Signed)
 Ph: [phone] Fax: [phone]   Patient ID: Ricky Garrett, male    DOB: 09-21-1940, 83 y.o.   MRN: [id number]  This visit was conducted in person.  BP 118/72   Pulse 64   Temp 98.1 F (36.7 C) (Oral)   Ht 5' 7 (1.702 m)   Wt 148 lb 6 oz (67.3 kg)   SpO2 97%   BMI 23.24 kg/m   BP Readings from Last 3 Encounters:  08/10/23 118/72  02/14/23 126/70  11/03/22 134/78   CC: 6 mo DM f/u visit  Subjective:   HPI: Ricky Garrett is a 83 y.o. male presenting on 08/10/2023 for Medical Management of Chronic Issues ( f/u dm )   Notes nocturia x3-4 at night. Weakening stream, dysuria.   DM - does regularly check sugars - 145 fasting this morning, normally 120-140, PM doses 140s. Compliant with antihyperglycemic regimen which includes: metformin  XR 500mg  daily (lower dose), actos  15mg  daily. Denies low sugars or hypoglycemic symptoms. Denies paresthesias, blurry vision. Last diabetic eye exam 08/2022. Glucometer brand: accu-check aviva. Last foot exam: DUE. DSME: saw nutritionist 07/2019.  Lab Results  Component Value Date   HGBA1C 7.1 (A) 08/10/2023   Diabetic Foot Exam - Simple   Simple Foot Form Diabetic Foot exam was performed with the following findings: Yes 08/10/2023  1:20 PM  Visual Inspection No deformities, no ulcerations, no other skin breakdown bilaterally: Yes Sensation Testing Intact to touch and monofilament testing bilaterally: Yes Pulse Check Posterior Tibialis and Dorsalis pulse intact bilaterally: Yes Comments    No results found for: MACKEY CURRENT       Relevant past medical, surgical, family and social history reviewed and updated as indicated. Interim medical history since our last visit reviewed. Allergies and medications reviewed and updated. Outpatient Medications Prior to Visit  Medication Sig Dispense Refill   Accu-Chek Softclix Lancets lancets Use as instructed to check sugars twice daily E11.69 200 each 3   aspirin  EC 81 MG  tablet Take 1 tablet (81 mg total) by mouth every Monday, Wednesday, and Friday.     glucose blood (ACCU-CHEK AVIVA PLUS) test strip Use as instructed to check sugars twice daily E11.69 200 strip 3   lisinopril -hydrochlorothiazide  (ZESTORETIC ) 20-25 MG tablet Take 1 tablet by mouth daily. 90 tablet 4   lovastatin  (MEVACOR ) 20 MG tablet Take 1 tablet nightly for cholesterol 90 tablet 4   Multiple Vitamins-Minerals (CENTRUM SILVER ) tablet Take 1 tablet by mouth daily.     omeprazole  (PRILOSEC) 40 MG capsule Take 1 capsule (40 mg total) by mouth daily. 90 capsule 4   Peppermint Oil (IBGARD PO) Take by mouth as needed.     pioglitazone  (ACTOS ) 15 MG tablet Take 1 tablet (15 mg total) by mouth daily. 90 tablet 4   metFORMIN  (GLUCOPHAGE -XR) 500 MG 24 hr tablet Take 2 tablets (1,000 mg total) by mouth daily with breakfast. 180 tablet 4   No facility-administered medications prior to visit.     Per HPI unless specifically indicated in ROS section below Review of Systems  Objective:  BP 118/72   Pulse 64   Temp 98.1 F (36.7 C) (Oral)   Ht 5' 7 (1.702 m)   Wt 148 lb 6 oz (67.3 kg)   SpO2 97%   BMI 23.24 kg/m   Wt Readings from Last 3 Encounters:  08/10/23 148 lb 6 oz (67.3 kg)  08/02/23 149 lb (67.6 kg)  02/14/23 149 lb 8 oz (67.8 kg)  Physical Exam Vitals and nursing note reviewed.  Constitutional:      Appearance: Normal appearance. He is not ill-appearing.  Eyes:     Extraocular Movements: Extraocular movements intact.     Conjunctiva/sclera: Conjunctivae normal.     Pupils: Pupils are equal, round, and reactive to light.  Cardiovascular:     Rate and Rhythm: Normal rate and regular rhythm.     Pulses: Normal pulses.     Heart sounds: Normal heart sounds. No murmur heard. Pulmonary:     Effort: Pulmonary effort is normal. No respiratory distress.     Breath sounds: Normal breath sounds. No wheezing, rhonchi or rales.  Musculoskeletal:     Right lower leg: No edema.      Left lower leg: No edema.     Comments: See HPI for foot exam if done  Skin:    General: Skin is warm and dry.     Findings: No rash.  Neurological:     Mental Status: He is alert.  Psychiatric:        Mood and Affect: Mood normal.        Behavior: Behavior normal.       Results for orders placed or performed in visit on 08/10/23  POCT glycosylated hemoglobin (Hb A1C)   Collection Time: 08/10/23 12:47 PM  Result Value Ref Range   Hemoglobin A1C 7.1 (A) 4.0 - 5.6 %   HbA1c POC (<> result, manual entry)     HbA1c, POC (prediabetic range)     HbA1c, POC (controlled diabetic range)    POCT Urinalysis Dipstick (Automated)   Collection Time: 08/10/23  1:30 PM  Result Value Ref Range   Color, UA amber    Clarity, UA clear    Glucose, UA Negative Negative   Bilirubin, UA neg    Ketones, UA neg    Spec Grav, UA 1.015 1.010 - 1.025   Blood, UA neg    pH, UA 6.0 5.0 - 8.0   Protein, UA Negative Negative   Urobilinogen, UA 0.2 0.2 or 1.0 E.U./dL   Nitrite, UA neg    Leukocytes, UA Negative Negative    Assessment & Plan:   Problem List Items Addressed This Visit     Type 2 diabetes mellitus with other specified complication (HCC) - Primary   Chronic, adequate on current regimen of low dose actose and metformin  XR - continue. Discussed trying nightly metformin  XR dosing to try and improve fasting hyperglycemia.       Relevant Medications   metFORMIN  (GLUCOPHAGE -XR) 500 MG 24 hr tablet   Other Relevant Orders   POCT glycosylated hemoglobin (Hb A1C) (Completed)   BPH (benign prostatic hyperplasia)   Worsening symptoms off alpha blocker.  With dysuria, check UA to r/o UTI contribution. Start flomax  0.4mg  nightly as less likely to cause orthostatic hypotension.       Relevant Medications   tamsulosin  (FLOMAX ) 0.4 MG CAPS capsule   Other Visit Diagnoses       Dysuria       Relevant Orders   POCT Urinalysis Dipstick (Automated) (Completed)        Meds ordered this  encounter  Medications   DISCONTD: metFORMIN  (GLUCOPHAGE -XR) 500 MG 24 hr tablet    Sig: Take 1 tablet (500 mg total) by mouth daily with breakfast.    Dispense:  90 tablet    Refill:  1   metFORMIN  (GLUCOPHAGE -XR) 500 MG 24 hr tablet    Sig: Take 1 tablet (500 mg  total) by mouth daily with supper.    Dispense:  90 tablet    Refill:  1   tamsulosin  (FLOMAX ) 0.4 MG CAPS capsule    Sig: Take 1 capsule (0.4 mg total) by mouth daily.    Dispense:  30 capsule    Refill:  11    Orders Placed This Encounter  Procedures   POCT glycosylated hemoglobin (Hb A1C)   POCT Urinalysis Dipstick (Automated)    Patient Instructions  Urinalysis today  Comienze flomax  para prostata.  Tome metformina XR diariamente de noche  Azucar sigue bien - siga actos  tambien. Regresar en 6 meses para proximo examen fisico  Follow up plan: Return in about 6 months (around 02/10/2024), or if symptoms worsen or fail to improve, for annual exam, prior fasting for blood work, medicare wellness visit.  Anton Blas, MD

## 2023-08-10 NOTE — Assessment & Plan Note (Addendum)
 Worsening symptoms off alpha blocker.  With dysuria, check UA to r/o UTI contribution. Start flomax  0.4mg  nightly as less likely to cause orthostatic hypotension.

## 2023-08-10 NOTE — Assessment & Plan Note (Signed)
 Chronic, adequate on current regimen of low dose actose and metformin  XR - continue. Discussed trying nightly metformin  XR dosing to try and improve fasting hyperglycemia.

## 2023-08-13 DIAGNOSIS — L814 Other melanin hyperpigmentation: Secondary | ICD-10-CM | POA: Diagnosis not present

## 2023-08-13 DIAGNOSIS — D225 Melanocytic nevi of trunk: Secondary | ICD-10-CM | POA: Diagnosis not present

## 2023-08-13 DIAGNOSIS — L821 Other seborrheic keratosis: Secondary | ICD-10-CM | POA: Diagnosis not present

## 2023-08-13 DIAGNOSIS — D492 Neoplasm of unspecified behavior of bone, soft tissue, and skin: Secondary | ICD-10-CM | POA: Diagnosis not present

## 2023-08-13 DIAGNOSIS — C44329 Squamous cell carcinoma of skin of other parts of face: Secondary | ICD-10-CM | POA: Diagnosis not present

## 2023-08-13 DIAGNOSIS — L570 Actinic keratosis: Secondary | ICD-10-CM | POA: Diagnosis not present

## 2023-08-14 ENCOUNTER — Ambulatory Visit: Payer: 59 | Admitting: Family Medicine

## 2023-08-17 DIAGNOSIS — Z961 Presence of intraocular lens: Secondary | ICD-10-CM | POA: Diagnosis not present

## 2023-08-17 DIAGNOSIS — H04222 Epiphora due to insufficient drainage, left lacrimal gland: Secondary | ICD-10-CM | POA: Diagnosis not present

## 2023-08-17 DIAGNOSIS — H43811 Vitreous degeneration, right eye: Secondary | ICD-10-CM | POA: Diagnosis not present

## 2023-08-17 DIAGNOSIS — E119 Type 2 diabetes mellitus without complications: Secondary | ICD-10-CM | POA: Diagnosis not present

## 2023-08-17 DIAGNOSIS — H43392 Other vitreous opacities, left eye: Secondary | ICD-10-CM | POA: Diagnosis not present

## 2023-08-17 DIAGNOSIS — H43821 Vitreomacular adhesion, right eye: Secondary | ICD-10-CM | POA: Diagnosis not present

## 2023-08-17 LAB — HM DIABETES EYE EXAM

## 2023-08-27 DIAGNOSIS — S43401A Unspecified sprain of right shoulder joint, initial encounter: Secondary | ICD-10-CM | POA: Diagnosis not present

## 2023-09-04 ENCOUNTER — Encounter (HOSPITAL_BASED_OUTPATIENT_CLINIC_OR_DEPARTMENT_OTHER): Payer: Self-pay | Admitting: Physician Assistant

## 2023-09-04 ENCOUNTER — Ambulatory Visit (HOSPITAL_BASED_OUTPATIENT_CLINIC_OR_DEPARTMENT_OTHER): Admitting: Physician Assistant

## 2023-09-04 DIAGNOSIS — M25511 Pain in right shoulder: Secondary | ICD-10-CM | POA: Diagnosis not present

## 2023-09-04 MED ORDER — METHYLPREDNISOLONE ACETATE 40 MG/ML IJ SUSP
40.0000 mg | INTRAMUSCULAR | Status: AC | PRN
  Administered 2023-09-04: 40 mg via INTRA_ARTICULAR

## 2023-09-04 MED ORDER — LIDOCAINE HCL 1 % IJ SOLN
4.0000 mL | INTRAMUSCULAR | Status: AC | PRN
  Administered 2023-09-04: 4 mL

## 2023-09-04 NOTE — Progress Notes (Signed)
 Office Visit Note   Patient: Ricky Garrett           Date of Birth: July 11, 1940           MRN: [id number] Visit Date: 09/04/2023              Requested by: Rilla Baller, MD [address] [place],  [place] [address] PCP: Rilla Baller, MD   Assessment & Plan: Visit Diagnoses:  1. Acute pain of right shoulder     Plan: Patient is a pleasant 83 year old Spanish-speaking gentleman who comes in with his daughter who interprets.  He has about a 3-week history of right shoulder pain after falling at an airport in [place] .  He is right-hand dominant x-rays he brought with him today did not show any acute fractures no evidence of dislocation.  Exam findings are consistent with rotator cuff contusion.  He does have fair strength but is quite painful.  I talked about going forward with a steroid injection today with physical therapy.  He does have diabetes but it is well-controlled.  His daughter says he would like to do this he will follow-up with me in 5 weeks if he does not get better could consider an MRI  Follow-Up Instructions: Return in about 5 weeks (around 10/09/2023).   Orders:  Orders Placed This Encounter  Procedures   Ambulatory referral to Physical Therapy   No orders of the defined types were placed in this encounter.     Procedures: Large Joint Inj: R subacromial bursa on 09/04/2023 1:30 PM Indications: diagnostic evaluation and pain Details: 22 G 1.5 in needle, posterior approach  Arthrogram: No  Medications: 4 mL lidocaine  1 %; 40 mg methylPREDNISolone  acetate 40 MG/ML Outcome: tolerated well, no immediate complications Procedure, treatment alternatives, risks and benefits explained, specific risks discussed. Consent was given by the patient. Immediately prior to procedure a time out was called to verify the correct patient, procedure, equipment, support staff and site/side marked as required.       Clinical Data: No additional  findings.   Subjective: Chief Complaint  Patient presents with   Right Shoulder - Pain    HPI pleasant 83 year old gentleman comes in today with a chief complaint of right shoulder pain.  He fell in the airport in [place]  a few weeks ago he was seen in an urgent care.  X-rays did not demonstrate any fractures.  Pain hurts when he goes behind his back and overhead.  Review of Systems  All other systems reviewed and are negative.    Objective: Vital Signs: There were no vitals taken for this visit.  Physical Exam Constitutional:      Appearance: Normal appearance.  Pulmonary:     Effort: Pulmonary effort is normal.  Skin:    General: Skin is warm and dry.  Neurological:     General: No focal deficit present.     Mental Status: He is alert and oriented to person, place, and time.  Psychiatric:        Mood and Affect: Mood normal.        Behavior: Behavior normal.     Ortho Exam Right shoulder I can passively get him 280 degrees he can sustain this though it is quite painful.  He can internally rotate behind his back again quite painful.  External rotation less painful.  He has a positive empty can impingement findings and a positive speeds test no ecchymosis no swelling he is neurovascular intact with good grip strength.  Also good with abductor external and internal rotation strength Specialty Comments:  No specialty comments available.  Imaging: No results found.   PMFS History: Patient Active Problem List   Diagnosis Date Noted   Diarrhea 01/18/2022   Dysphagia 09/24/2021   Decreased hearing of both ears 11/18/2019   Irregular heart beat 11/18/2019   Medicare annual wellness visit, subsequent 12/23/2018   Health maintenance examination 12/23/2018   Advanced care planning/counseling discussion 12/23/2018   Hypertriglyceridemia 02/26/2017   Left external ear skin cancer status post resection 09/16/2015   BPH (benign prostatic hyperplasia) 01/06/2014   Tubular  adenoma of colon 09/12/2013   Type 2 diabetes mellitus with other specified complication (HCC) 08/20/2012   Insomnia 03/26/2012   GERD (gastroesophageal reflux disease) 09/13/2011   Thrombocytopenia (HCC) 12/30/2009   Unintentional weight loss 03/18/2009   Dyslipidemia associated with type 2 diabetes mellitus (HCC) 06/13/2007   Allergic rhinitis 06/13/2007   Hypertension associated with diabetes (HCC) 09/28/2006   Past Medical History:  Diagnosis Date   Cancer of skin of left ear 2019   seen by a dermatologist in [place]   Decreased hearing    pt declines   Diabetes mellitus without complication (HCC)    GERD (gastroesophageal reflux disease)    History of stomach ulcers    Hyperlipidemia    Hypertension    Pigmented skin lesion 03/2020   R occipital scalp - pigmented lichen planus-like keratosis ([facility])   Rhinitis, allergic    Thrombocytopenia (HCC)     Family History  Problem Relation Age of Onset   CAD Neg Hx    Stroke Neg Hx    Diabetes Neg Hx    Cancer Neg Hx     Past Surgical History:  Procedure Laterality Date   CATARACT EXTRACTION Right 10/2022   CATARACT EXTRACTION Left 12/2022   COLONOSCOPY  2011   1 polyp (outlaw)   COLONOSCOPY  01/2014   diverticulosis, 2 polyps, hemorrhoids, rpt 5 yrs (Outlaw)   NASAL SEPTUM SURGERY  1990   Social History   Occupational History    Employer: [employer]  Tobacco Use   Smoking status: Former    Current packs/day: 0.00    Average packs/day: 0.5 packs/day for 1 year (0.5 ttl pk-yrs)    Types: Cigarettes    Start date: 1989    Quit date: 1990    Years since quitting: 35.6   Smokeless tobacco: Never  Vaping Use   Vaping status: Never Used  Substance and Sexual Activity   Alcohol use: Not Currently    Alcohol/week: 1.0 standard drink of alcohol    Types: 1 Cans of beer per week   Drug use: No   Sexual activity: Not Currently    Partners: Female    Birth control/protection: None

## 2023-09-14 DIAGNOSIS — C44329 Squamous cell carcinoma of skin of other parts of face: Secondary | ICD-10-CM | POA: Diagnosis not present

## 2023-09-24 NOTE — Therapy (Unsigned)
 OUTPATIENT PHYSICAL THERAPY SHOULDER EVALUATION   Patient Name: Ricky Garrett MRN: [id number] DOB:[birth year], 83 y.o., male Today's Date: 09/25/2023  END OF SESSION:  PT End of Session - 09/25/23 1237     Visit Number 1    Number of Visits 12    Date for PT Re-Evaluation 11/06/23    PT Start Time 1233    PT Stop Time 1315    PT Time Calculation (min) 42 min    Activity Tolerance Patient tolerated treatment well    Behavior During Therapy [facility] for tasks assessed/performed          Past Medical History:  Diagnosis Date   Cancer of skin of left ear 2019   seen by a dermatologist in [facility]   Decreased hearing    pt declines   Diabetes mellitus without complication (HCC)    GERD (gastroesophageal reflux disease)    History of stomach ulcers    Hyperlipidemia    Hypertension    Pigmented skin lesion 03/2020   R occipital scalp - pigmented lichen planus-like keratosis ([facility])   Rhinitis, allergic    Thrombocytopenia (HCC)    Past Surgical History:  Procedure Laterality Date   CATARACT EXTRACTION Right 10/2022   CATARACT EXTRACTION Left 12/2022   COLONOSCOPY  2011   1 polyp (outlaw)   COLONOSCOPY  01/2014   diverticulosis, 2 polyps, hemorrhoids, rpt 5 yrs (Outlaw)   NASAL SEPTUM SURGERY  1990   Patient Active Problem List   Diagnosis Date Noted   Diarrhea 01/18/2022   Dysphagia 09/24/2021   Decreased hearing of both ears 11/18/2019   Irregular heart beat 11/18/2019   Medicare annual wellness visit, subsequent 12/23/2018   Health maintenance examination 12/23/2018   Advanced care planning/counseling discussion 12/23/2018   Hypertriglyceridemia 02/26/2017   Left external ear skin cancer status post resection 09/16/2015   BPH (benign prostatic hyperplasia) 01/06/2014   Tubular adenoma of colon 09/12/2013   Type 2 diabetes mellitus with other specified complication (HCC) 08/20/2012   Insomnia 03/26/2012   GERD (gastroesophageal reflux disease) 09/13/2011    Thrombocytopenia (HCC) 12/30/2009   Unintentional weight loss 03/18/2009   Dyslipidemia associated with type 2 diabetes mellitus (HCC) 06/13/2007   Allergic rhinitis 06/13/2007   Hypertension associated with diabetes (HCC) 09/28/2006    PCP: Rilla Baller MD   REFERRING PROVIDER: Persons, Ronal Dragon PA   REFERRING DIAG: M25.511 (ICD-10-CM) - Acute pain of right shoulder  THERAPY DIAG:  No diagnosis found.  Rationale for Evaluation and Treatment: Rehabilitation  ONSET DATE: July 2025   SUBJECTIVE:  SUBJECTIVE STATEMENT: Pt fell in July while rushing in the airport.  He reports his shoulder went back as he fell.  He had a cortisone shot and it did help.  Before he could not even lift it up.  He can do this now a bit. His daughter reports he cannot sit still, was mowing the lawn, doing too much.  Pain is min to moderate.  He has been doing some exercises. Sometimes he has numbness and tingling in his arm, is weaker than he was.  He reports no pain right now but it does pop when he moves it back and forth.    Hand dominance: Right  PERTINENT HISTORY: Patient is a pleasant 83 year old Spanish-speaking gentleman who comes in with his daughter who interprets. He has about a 3-week history of right shoulder pain after falling at an airport in [place] . He is right-hand dominant x-rays he brought with him today did not show any acute fractures no evidence of dislocation. Exam findings are consistent with rotator cuff contusion. He does have fair strength but is quite painful. I talked about going forward with a steroid injection today with physical therapy. He does have diabetes but it is well-controlled. His daughter says he would like to do this he will follow-up with me in 5 weeks if he does not get better could  consider an MRI  PAIN:  Are you having pain? Yes: NPRS scale: none right now  Pain location: Rt scapula and Rt deltoid  Pain description: pinching  Aggravating factors: moving it a certain way  Relieving factors: shot, tylenol- has DC   PRECAUTIONS: None  RED FLAGS: None   WEIGHT BEARING RESTRICTIONS: No  FALLS:  Has patient fallen in last 6 months? Yes. Number of falls 1, while running in the airport   LIVING ENVIRONMENT: Lives with: lives with their family Lives in: House/apartment Stairs: Yes: External: 4 steps; on right going up Has following equipment at home: Nonelives with daughter and her family   OCCUPATION: Did some factory work and a Musician   PLOF: Independent  PATIENT GOALS:I want to feel better get back to normal   NEXT MD VISIT:   OBJECTIVE:  Note: Objective measures were completed at Evaluation unless otherwise noted.  DIAGNOSTIC FINDINGS:  XR  was normal   PATIENT SURVEYS:  {rehab surveys:24030:a}  COGNITION: Overall cognitive status: {cognition:24006}     SENSATION: {sensation:27233}  POSTURE: ***  UPPER EXTREMITY ROM:   {AROM/PROM:27142} ROM Right eval Left eval  Shoulder flexion 115   Shoulder extension 55   Shoulder abduction 120   Shoulder adduction    Shoulder internal rotation FR to upper lumbar with pain    Shoulder external rotation    Elbow flexion    Elbow extension    Wrist flexion    Wrist extension    Wrist ulnar deviation    Wrist radial deviation    Wrist pronation    Wrist supination    (Blank rows = not tested)  UPPER EXTREMITY MMT:  MMT Right eval Left eval  Shoulder flexion 4- 5  Shoulder extension    Shoulder abduction 4- 5  Shoulder adduction    Shoulder internal rotation 5 pain    Shoulder external rotation 4 pain    Middle trapezius    Lower trapezius    Elbow flexion    Elbow extension    Wrist flexion    Wrist extension    Wrist ulnar deviation    Wrist radial deviation  Wrist  pronation    Wrist supination    Grip strength (lbs)    (Blank rows = not tested)  SHOULDER SPECIAL TESTS: Impingement tests: {shoulder impingement test:25231:a} SLAP lesions: {SLAP lesions:25232} Instability tests: {shoulder instability test:25233} Rotator cuff assessment: {rotator cuff assessment:25234} Biceps assessment: {biceps assessment:25235}  JOINT MOBILITY TESTING:  ***  PALPATION:  ***                                                                                                                             TREATMENT DATE: ***   PATIENT EDUCATION: Education details: *** Person educated: {Person educated:25204} Education method: {Education Method:25205} Education comprehension: {Education Comprehension:25206}  HOME EXERCISE PROGRAM: Access Code: [email or web address] URL: [email or web address] Date: 09/25/2023 Prepared by: Delon Norma  Exercises - Shoulder External Rotation and Scapular Retraction with Resistance  - 1 x daily - 7 x weekly - 2 sets - 10 reps - 5 hold - Shoulder extension with resistance - Neutral  - 1 x daily - 7 x weekly - 2 sets - 10 reps - 5 hold - Standing Shoulder Row with Anchored Resistance  - 1 x daily - 7 x weekly - 2 sets - 10 reps - 5 hold  ASSESSMENT:  CLINICAL IMPRESSION: Patient is a *** y.o. *** who was seen today for physical therapy evaluation and treatment for ***.   OBJECTIVE IMPAIRMENTS: {opptimpairments:25111}.   ACTIVITY LIMITATIONS: {activitylimitations:27494}  PARTICIPATION LIMITATIONS: {participationrestrictions:25113}  PERSONAL FACTORS: {Personal factors:25162} are also affecting patient's functional outcome.   REHAB POTENTIAL: {rehabpotential:25112}  CLINICAL DECISION MAKING: {clinical decision making:25114}  EVALUATION COMPLEXITY: {Evaluation complexity:25115}   GOALS: Goals reviewed with patient? {yes/no:20286}  SHORT TERM GOALS: Target date: ***  *** Baseline: Goal status: INITIAL  2.   *** Baseline:  Goal status: INITIAL  3.  *** Baseline:  Goal status: INITIAL  4.  *** Baseline:  Goal status: INITIAL  5.  *** Baseline:  Goal status: INITIAL  6.  *** Baseline:  Goal status: INITIAL  LONG TERM GOALS: Target date: ***  *** Baseline:  Goal status: INITIAL  2.  *** Baseline:  Goal status: INITIAL  3.  *** Baseline:  Goal status: INITIAL  4.  *** Baseline:  Goal status: INITIAL  5.  *** Baseline:  Goal status: INITIAL  6.  *** Baseline:  Goal status: INITIAL  PLAN:  PT FREQUENCY: {rehab frequency:25116}  PT DURATION: {rehab duration:25117}  PLANNED INTERVENTIONS: {rehab planned interventions:25118::97110-Therapeutic exercises,97530- Therapeutic [phone]- Neuromuscular re-education,97535- Self Rjmz,02859- Manual therapy}  PLAN FOR NEXT SESSION: ***   Saabir Blyth, PT 09/25/2023, 12:38 PM

## 2023-09-25 ENCOUNTER — Ambulatory Visit: Attending: Physician Assistant | Admitting: Physical Therapy

## 2023-09-25 DIAGNOSIS — M25511 Pain in right shoulder: Secondary | ICD-10-CM | POA: Insufficient documentation

## 2023-09-28 ENCOUNTER — Encounter (HOSPITAL_COMMUNITY): Payer: Self-pay

## 2023-09-28 ENCOUNTER — Ambulatory Visit (HOSPITAL_COMMUNITY)
Admission: EM | Admit: 2023-09-28 | Discharge: 2023-09-28 | Disposition: A | Discharge status: Home / Self Care | Attending: Family Medicine | Admitting: Family Medicine

## 2023-09-28 DIAGNOSIS — L03113 Cellulitis of right upper limb: Secondary | ICD-10-CM

## 2023-09-28 DIAGNOSIS — L239 Allergic contact dermatitis, unspecified cause: Secondary | ICD-10-CM

## 2023-09-28 MED ORDER — CEPHALEXIN 250 MG PO CAPS: 250.0000 mg | ORAL_CAPSULE | Freq: Three times a day (TID) | ORAL | 0 refills | Status: AC

## 2023-09-28 MED ORDER — MUPIROCIN 2 % EX OINT
1.0000 | TOPICAL_OINTMENT | Freq: Two times a day (BID) | CUTANEOUS | 0 refills | Status: AC
Start: 2023-09-28 — End: ?

## 2023-09-28 MED ORDER — DEXAMETHASONE SODIUM PHOSPHATE 10 MG/ML IJ SOLN
INTRAMUSCULAR | Status: AC
  Filled 2023-09-28: qty 1

## 2023-09-28 MED ORDER — DEXAMETHASONE SODIUM PHOSPHATE 10 MG/ML IJ SOLN
10.0000 mg | Freq: Once | INTRAMUSCULAR | Status: AC
  Administered 2023-09-28: 10 mg via INTRAMUSCULAR

## 2023-09-28 NOTE — Discharge Instructions (Addendum)
 You have been given a shot of dexamethasone  10 mg, steroid.  This medicine can make your sugars go higher  Take cephalexin  250 mg--1 capsule 3 times daily for 7 days  Put mupirocin  ointment on the sore area on your left arm twice daily until improved  Take Zyrtec/cetirizine 10 mg over the counter--1 daily as needed for itching.  (Le han administrado una inyeccin de dexametasona 10 mg, un esteroide. Este medicamento puede aumentar sus niveles de azcar.  Tome cefalexina 250 mg (1 cpsula) 3 veces al da durante 7 [address]  Aplique ungento de mupirocina en la zona dolorida del brazo izquierdo dos veces al da [employer].  Tome Zyrtec/cetirizina 10 mg sin receta: 1 al da segn sea necesario para la picazn.)

## 2023-09-28 NOTE — ED Provider Notes (Signed)
 [facility]    CSN: [id number] Arrival date & time: 09/28/23  1658      History   Chief Complaint Chief Complaint  Patient presents with   Rash    HPI Ricky Garrett is a 83 y.o. male.    Rash Here for itching and rash and now a new sore spot on his arm.  Itching and rash began on the evening of August 24, after he had been outdoors working.  Then about 2 days ago he noticed a red swollen area started on his right volar forearm. No fever or chills and no trouble breathing.  NKDA  Past medical history significant for diabetes, and sugars were running in the 130s to 140s   Past Medical History:  Diagnosis Date   Cancer of skin of left ear 2019   seen by a dermatologist in [facility]   Decreased hearing    pt declines   Diabetes mellitus without complication (HCC)    GERD (gastroesophageal reflux disease)    History of stomach ulcers    Hyperlipidemia    Hypertension    Pigmented skin lesion 03/2020   R occipital scalp - pigmented lichen planus-like keratosis ([facility])   Rhinitis, allergic    Thrombocytopenia (HCC)     Patient Active Problem List   Diagnosis Date Noted   Diarrhea 01/18/2022   Dysphagia 09/24/2021   Decreased hearing of both ears 11/18/2019   Irregular heart beat 11/18/2019   Medicare annual wellness visit, subsequent 12/23/2018   Health maintenance examination 12/23/2018   Advanced care planning/counseling discussion 12/23/2018   Hypertriglyceridemia 02/26/2017   Left external ear skin cancer status post resection 09/16/2015   BPH (benign prostatic hyperplasia) 01/06/2014   Tubular adenoma of colon 09/12/2013   Type 2 diabetes mellitus with other specified complication (HCC) 08/20/2012   Insomnia 03/26/2012   GERD (gastroesophageal reflux disease) 09/13/2011   Thrombocytopenia (HCC) 12/30/2009   Unintentional weight loss 03/18/2009   Dyslipidemia associated with type 2 diabetes mellitus (HCC) 06/13/2007   Allergic rhinitis  06/13/2007   Hypertension associated with diabetes (HCC) 09/28/2006    Past Surgical History:  Procedure Laterality Date   CATARACT EXTRACTION Right 10/2022   CATARACT EXTRACTION Left 12/2022   COLONOSCOPY  2011   1 polyp (outlaw)   COLONOSCOPY  01/2014   diverticulosis, 2 polyps, hemorrhoids, rpt 5 yrs (Outlaw)   NASAL SEPTUM SURGERY  1990       Home Medications    Prior to Admission medications   Medication Sig Start Date End Date Taking? Authorizing Provider  cephALEXin  (KEFLEX ) 250 MG capsule Take 1 capsule (250 mg total) by mouth 3 (three) times daily for 7 days. 09/28/23 10/05/23 Yes Vonna Sharlet POUR, MD  mupirocin  ointment (BACTROBAN ) 2 % Apply 1 Application topically 2 (two) times daily. To affected area till better 09/28/23  Yes Makyle Eslick, Sharlet POUR, MD  Accu-Chek Softclix Lancets lancets Use as instructed to check sugars twice daily E11.69 02/14/23   Rilla Baller, MD  aspirin  EC 81 MG tablet Take 1 tablet (81 mg total) by mouth every Monday, Wednesday, and Friday. 06/25/19   Rilla Baller, MD  glucose blood (ACCU-CHEK AVIVA PLUS) test strip Use as instructed to check sugars twice daily E11.69 02/14/23   Rilla Baller, MD  lisinopril -hydrochlorothiazide  (ZESTORETIC ) 20-25 MG tablet Take 1 tablet by mouth daily. 02/14/23   Rilla Baller, MD  lovastatin  (MEVACOR ) 20 MG tablet Take 1 tablet nightly for cholesterol 02/14/23   Rilla Baller, MD  metFORMIN  (GLUCOPHAGE -XR) 500  MG 24 hr tablet Take 1 tablet (500 mg total) by mouth daily with supper. 08/10/23   Rilla Baller, MD  Multiple Vitamins-Minerals (CENTRUM SILVER ) tablet Take 1 tablet by mouth daily. 06/04/17   Kristy Sharolyn Lenis, PA-C  omeprazole  (PRILOSEC) 40 MG capsule Take 1 capsule (40 mg total) by mouth daily. 02/14/23   Rilla Baller, MD  Peppermint Oil (IBGARD PO) Take by mouth as needed.    [provider]  pioglitazone  (ACTOS ) 15 MG tablet Take 1 tablet (15 mg total) by mouth daily. 02/14/23    Rilla Baller, MD  tamsulosin  (FLOMAX ) 0.4 MG CAPS capsule Take 1 capsule (0.4 mg total) by mouth daily. 08/10/23   Rilla Baller, MD    Family History Family History  Problem Relation Age of Onset   CAD Neg Hx    Stroke Neg Hx    Diabetes Neg Hx    Cancer Neg Hx     Social History Social History   Tobacco Use   Smoking status: Former    Current packs/day: 0.00    Average packs/day: 0.5 packs/day for 1 year (0.5 ttl pk-yrs)    Types: Cigarettes    Start date: 89    Quit date: 1990    Years since quitting: 35.6   Smokeless tobacco: Never  Vaping Use   Vaping status: Never Used  Substance Use Topics   Alcohol use: Not Currently    Alcohol/week: 1.0 standard drink of alcohol    Types: 1 Cans of beer per week   Drug use: No     Allergies   Patient has no known allergies.   Review of Systems Review of Systems  Skin:  Positive for rash.     Physical Exam Triage Vital Signs ED Triage Vitals  Encounter Vitals Group     BP 09/28/23 1735 (!) 158/72     Girls Systolic BP Percentile --      Girls Diastolic BP Percentile --      Boys Systolic BP Percentile --      Boys Diastolic BP Percentile --      Pulse Rate 09/28/23 1735 70     Resp 09/28/23 1735 16     Temp 09/28/23 1735 98.3 F (36.8 C)     Temp Source 09/28/23 1735 Oral     SpO2 09/28/23 1735 94 %     Weight --      Height --      Head Circumference --      Peak Flow --      Pain Score 09/28/23 1738 0     Pain Loc --      Pain Education --      Exclude from Growth Chart --    No data found.  Updated Vital Signs BP (!) 158/72 (BP Location: Right Arm)   Pulse 70   Temp 98.3 F (36.8 C) (Oral)   Resp 16   SpO2 94%   Visual Acuity Right Eye Distance:   Left Eye Distance:   Bilateral Distance:    Right Eye Near:   Left Eye Near:    Bilateral Near:     Physical Exam Vitals reviewed.  Constitutional:      General: He is not in acute distress.    Appearance: He is not  ill-appearing, toxic-appearing or diaphoretic.  HENT:     Mouth/Throat:     Mouth: Mucous membranes are moist.  Eyes:     Extraocular Movements: Extraocular movements intact.  Conjunctiva/sclera: Conjunctivae normal.     Pupils: Pupils are equal, round, and reactive to light.  Cardiovascular:     Rate and Rhythm: Normal rate and regular rhythm.     Heart sounds: No murmur heard. Pulmonary:     Effort: Pulmonary effort is normal. No respiratory distress.     Breath sounds: Normal breath sounds. No stridor. No wheezing, rhonchi or rales.  Musculoskeletal:     Cervical back: Neck supple.  Lymphadenopathy:     Cervical: No cervical adenopathy.  Skin:    Capillary Refill: Capillary refill takes less than 2 seconds.     Coloration: Skin is not jaundiced or pale.     Comments: There are scattered erythematous bumps about 2 mm diameter.  On his right volar forearm there is an area of erythema and induration about 2.5 cm in diameter.  No fluctuance.  Neurological:     General: No focal deficit present.     Mental Status: He is alert and oriented to person, place, and time.  Psychiatric:        Behavior: Behavior normal.      UC Treatments / Results  Labs (all labs ordered are listed, but only abnormal results are displayed) Labs Reviewed - No data to display  EKG   Radiology No results found.  Procedures Procedures (including critical care time)  Medications Ordered in UC Medications  dexamethasone  (DECADRON ) injection 10 mg (has no administration in time range)    Initial Impression / Assessment and Plan / UC Course  I have reviewed the triage vital signs and the nursing notes.  Pertinent labs & imaging results that were available during my care of the patient were reviewed by me and considered in my medical decision making (see chart for details).     Decadron  is given here for the allergic dermatitis and Keflex  is sent in for the secondary infection and  cellulitis of the right forearm. Final Clinical Impressions(s) / UC Diagnoses   Final diagnoses:  Allergic dermatitis  Cellulitis of right upper extremity     Discharge Instructions      You have been given a shot of dexamethasone  10 mg, steroid.  This medicine can make your sugars go higher  Take cephalexin  250 mg--1 capsule 3 times daily for 7 days  Put mupirocin  ointment on the sore area on your left arm twice daily until improved  Take Zyrtec/cetirizine 10 mg over the counter--1 daily as needed for itching.  (Le han administrado una inyeccin de dexametasona 10 mg, un esteroide. Este medicamento puede aumentar sus niveles de azcar.  Tome cefalexina 250 mg (1 cpsula) 3 veces al da durante 7 [address]  Aplique ungento de mupirocina en la zona dolorida del brazo izquierdo dos veces al da [employer].  Tome Zyrtec/cetirizina 10 mg sin receta: 1 al da segn sea necesario para la picazn.)     ED Prescriptions     Medication Sig Dispense Auth. Provider   cephALEXin  (KEFLEX ) 250 MG capsule Take 1 capsule (250 mg total) by mouth 3 (three) times daily for 7 days. 21 capsule Kyara Boxer K, MD   mupirocin  ointment (BACTROBAN ) 2 % Apply 1 Application topically 2 (two) times daily. To affected area till better 22 g Vonna Sharlet POUR, MD      PDMP not reviewed this encounter.   Vonna Sharlet POUR, MD 09/28/23 [phone]

## 2023-09-28 NOTE — ED Triage Notes (Signed)
 Patient here today with c/o itching rash on his arms and legs since Sunday after working in the garden. Patient has small wound from scratching on his right wrist. He has been using Benadryl with some relief.

## 2023-10-11 NOTE — Therapy (Signed)
 OUTPATIENT PHYSICAL THERAPY SHOULDER TREATMENT/DC   Patient Name: Ricky Garrett MRN: [id number] DOB:Apr 19, 1940, 83 y.o., male Today's Date: 10/12/2023  END OF SESSION:  PT End of Session - 10/12/23 1324     Visit Number 2    Number of Visits 12    Date for PT Re-Evaluation 11/06/23    Authorization Type [employer] DUAL COMPLETE    PT Start Time 1320    PT Stop Time 1348    PT Time Calculation (min) 28 min    Activity Tolerance Patient tolerated treatment well    Behavior During Therapy WFL for tasks assessed/performed           Past Medical History:  Diagnosis Date   Cancer of skin of left ear 2019   seen by a dermatologist in [facility]   Decreased hearing    pt declines   Diabetes mellitus without complication (HCC)    GERD (gastroesophageal reflux disease)    History of stomach ulcers    Hyperlipidemia    Hypertension    Pigmented skin lesion 03/2020   R occipital scalp - pigmented lichen planus-like keratosis ([facility])   Rhinitis, allergic    Thrombocytopenia (HCC)    Past Surgical History:  Procedure Laterality Date   CATARACT EXTRACTION Right 10/2022   CATARACT EXTRACTION Left 12/2022   COLONOSCOPY  2011   1 polyp (outlaw)   COLONOSCOPY  01/2014   diverticulosis, 2 polyps, hemorrhoids, rpt 5 yrs (Outlaw)   NASAL SEPTUM SURGERY  1990   Patient Active Problem List   Diagnosis Date Noted   Diarrhea 01/18/2022   Dysphagia 09/24/2021   Decreased hearing of both ears 11/18/2019   Irregular heart beat 11/18/2019   Medicare annual wellness visit, subsequent 12/23/2018   Health maintenance examination 12/23/2018   Advanced care planning/counseling discussion 12/23/2018   Hypertriglyceridemia 02/26/2017   Left external ear skin cancer status post resection 09/16/2015   BPH (benign prostatic hyperplasia) 01/06/2014   Tubular adenoma of colon 09/12/2013   Type 2 diabetes mellitus with other specified complication (HCC) 08/20/2012   Insomnia 03/26/2012    GERD (gastroesophageal reflux disease) 09/13/2011   Thrombocytopenia (HCC) 12/30/2009   Unintentional weight loss 03/18/2009   Dyslipidemia associated with type 2 diabetes mellitus (HCC) 06/13/2007   Allergic rhinitis 06/13/2007   Hypertension associated with diabetes (HCC) 09/28/2006    PCP: Rilla Baller MD   REFERRING PROVIDER: Persons, Ronal Dragon PA   REFERRING DIAG: M25.511 (ICD-10-CM) - Acute pain of right shoulder  THERAPY DIAG:  Acute pain of right shoulder  Rationale for Evaluation and Treatment: Rehabilitation  ONSET DATE: July 2025   SUBJECTIVE:  SUBJECTIVE STATEMENT: Pt reports he has been completing his HEP and his R shoulder is doing much better. The R shoulder is only experiencing occasional clicking.   EVAL: Pt fell in July while rushing in the airport.  He reports his shoulder went back as he fell.  He had a cortisone shot and it did help.  Before he could not even lift it up.  He can do this now a bit. His daughter reports he cannot sit still, was mowing the lawn, doing too much.  Pain is min to moderate.  He has been doing some exercises. Sometimes he has numbness and tingling in his arm, is weaker than he was.  He reports no pain right now but it does pop when he moves it back and forth.    Hand dominance: Right  PERTINENT HISTORY: Patient is a pleasant 83 year old Spanish-speaking gentleman who comes in with his daughter who interprets. He has about a 3-week history of right shoulder pain after falling at an airport in [place] . He is right-hand dominant x-rays he brought with him today did not show any acute fractures no evidence of dislocation. Exam findings are consistent with rotator cuff contusion. He does have fair strength but is quite painful. I talked about going forward  with a steroid injection today with physical therapy. He does have diabetes but it is well-controlled. His daughter says he would like to do this he will follow-up with me in 5 weeks if he does not get better could consider an MRI  PAIN:  Are you having pain? Yes: NPRS scale: none right now  Pain location: Rt scapula and Rt deltoid  Pain description: pinching  Aggravating factors: moving it a certain way  Relieving factors: shot, tylenol- has DC   PRECAUTIONS: None  RED FLAGS: None   WEIGHT BEARING RESTRICTIONS: No  FALLS:  Has patient fallen in last 6 months? Yes. Number of falls 1, while running in the airport   LIVING ENVIRONMENT: Lives with: lives with their family Lives in: House/apartment Stairs: Yes: External: 4 steps; on right going up Has following equipment at home: Nonelives with daughter and her family   OCCUPATION: Did some factory work and a Musician   PLOF: Independent  PATIENT GOALS:I want to feel better get back to normal   NEXT MD VISIT:   OBJECTIVE:  Note: Objective measures were completed at Evaluation unless otherwise noted.  DIAGNOSTIC FINDINGS:  XR  was normal   PATIENT SURVEYS:  Quick Dash: 34%   Minimally Clinically Important Difference (MCID): 15-20 points  (Franchignoni, F. et al. (2013). Minimally clinically important difference of the disabilities of the arm, shoulder, and hand outcome measures (DASH) and its shortened version (Quick DASH). Journal of Orthopaedic & Sports Physical Therapy, 44(1), 30-39)   COGNITION: Overall cognitive status: Within functional limits for tasks assessed     SENSATION: WFL  POSTURE: Not remarkable other than min forward head posture   UPPER EXTREMITY ROM:   Active ROM Right eval Left eval RT 10/12/23  Shoulder flexion 115  135 no p  Shoulder extension 55    Shoulder abduction 120    Shoulder adduction     Shoulder internal rotation FR to upper lumbar with pain   T10 no p  Shoulder external  rotation FR to C5-C6  T2 no p  Elbow flexion     Elbow extension     Wrist flexion     Wrist extension     Wrist ulnar deviation  Wrist radial deviation     Wrist pronation     Wrist supination     (Blank rows = not tested)  UPPER EXTREMITY MMT:  MMT Right eval Left eval RT 10/12/23  Shoulder flexion 4- 5 5 no p  Shoulder extension     Shoulder abduction 4- 5 5 no p  Shoulder adduction     Shoulder internal rotation 5 pain   5 no p    Shoulder external rotation 4 pain   5 no p  Middle trapezius     Lower trapezius     Elbow flexion     Elbow extension     Wrist flexion     Wrist extension     Wrist ulnar deviation     Wrist radial deviation     Wrist pronation     Wrist supination     Grip strength (lbs)     (Blank rows = not tested)  SHOULDER SPECIAL TESTS: Impingement tests: Neer impingement test: positive   PALPATION:  TTP Rt mid deltoid and Rt post shoulder , medium sized knot deltoid                                                                                                                             TREATMENT DATE:  [facility] Adult PT Treatment:                                                DATE: 10/12/23 Therapeutic Exercise: Row green band 2x10 Extension (GTB) 2x10 ER unattached green  2x 10   [facility] Adult PT Treatment:                                                DATE: 09/25/23 Therapeutic Exercise: Row green band (GTB) Extension (GTB) ER unattached red and green x 10  MMT ROM Therapeutic Activity: Quick dash  PATIENT EDUCATION: Education details: HEP, POC, modifcation of activity and use of ice post ex Person educated: Patient Education method: Explanation, Demonstration, Tactile cues, Verbal cues, and Handouts Education comprehension: needs further education  HOME EXERCISE PROGRAM: Access Code: [email or web address] URL: [email or web address] Date: 09/25/2023 Prepared by: Delon Norma  Exercises - Shoulder External Rotation and  Scapular Retraction with Resistance  - 1 x daily - 7 x weekly - 2 sets - 10 reps - 5 hold - Shoulder extension with resistance - Neutral  - 1 x daily - 7 x weekly - 2 sets - 10 reps - 5 hold - Standing Shoulder Row with Anchored Resistance  - 1 x daily - 7 x weekly - 2 sets - 10 reps - 5 hold  ASSESSMENT:  CLINICAL IMPRESSION: Pt returns to PT  for his 1st visit. Pt has made very good improvement in R shoulder ROM, strength and function meeting all PT goals. Pt has been consistent with his HEP and completes the exs properly. Pt is in agreement with DC at this time.   EVAL: Patient is a 83 y.o. male who was seen today for physical therapy evaluation and treatment for Rt shoulder pain consistent with rotator cuff strain. It is unlear if he dislocated his shoulder but has good strength, seeing improvement with injection and resuming his normal activities, needs to take more time before doing heavier yardwork.   OBJECTIVE IMPAIRMENTS: decreased coordination, decreased knowledge of condition, decreased mobility, decreased ROM, decreased strength, increased fascial restrictions, impaired flexibility, impaired UE functional use, and pain.   ACTIVITY LIMITATIONS: carrying, lifting, and reach over head  PARTICIPATION LIMITATIONS: community activity and yard work  PERSONAL FACTORS: Social background and 1 comorbidity: diabetes are also affecting patient's functional outcome.   REHAB POTENTIAL: Excellent  CLINICAL DECISION MAKING: Unstable/unpredictable  EVALUATION COMPLEXITY: Low   GOALS: Goals reviewed with patient? Yes  SHORT TERM GOALS=LONG TERM GOALS: Target date: 11/06/2023    Patient will be independent with final HEP upon discharge from PT and report consistent benefit following exercise completion.    Baseline: given on eval, basic HEP Goal status: MEY  2.  Patient will be able to demonstrate proper posture and lifting techniques related to shoulder, spine health and reduction of  symptoms.   Baseline: needs cues  Goal status: MET  3.  Patient will be able to resume more demanding home/yard tasks without increasing shoulder pain, modified as necessary.   Baseline: Needs guidance on what he should and should not do  Goal status: MET  4.  Quick DASH score will improve to 19% to demo improved function of his Rt UE  Baseline: 34% 10/12/23: 5% Goal status: MET  5.  Patient will demonstrate right shoulder flexion beyond 130 degrees without increased pain  Baseline: see above  Goal status: MET   PLAN:  PT FREQUENCY: 2x/week  PT DURATION: Home I6 weeks  PLANNED INTERVENTIONS: 97164- PT Re-evaluation, 97110-Therapeutic exercises, 97530- Therapeutic activity, 97112- Neuromuscular re-education, 97535- Self Care, [id number]- Manual therapy, Patient/Family education, Joint mobilization, Cryotherapy, and Moist heat  PLAN FOR NEXT SESSION: check HEP, progress AROM and strength as tolerated.  Education on lifting.   PHYSICAL THERAPY DISCHARGE SUMMARY  Visits from Start of Care: 2  Current functional level related to goals / functional outcomes: See clinical impression and PT goals    Remaining deficits: See clinical impression and PT goals   Education / Equipment: HEP   Patient agrees to discharge. Patient goals were met. Patient is being discharged due to meeting the stated rehab goals.    Jaydan Chretien, PT 10/12/2023, 2:30 PM

## 2023-10-12 ENCOUNTER — Ambulatory Visit: Attending: Physician Assistant

## 2023-10-12 DIAGNOSIS — M25511 Pain in right shoulder: Secondary | ICD-10-CM | POA: Diagnosis not present

## 2023-10-15 ENCOUNTER — Ambulatory Visit: Admitting: Physical Therapy

## 2023-10-17 ENCOUNTER — Ambulatory Visit: Admitting: Physical Therapy

## 2023-10-22 ENCOUNTER — Ambulatory Visit: Admitting: Physical Therapy

## 2023-10-24 ENCOUNTER — Ambulatory Visit

## 2023-11-02 ENCOUNTER — Encounter: Payer: Self-pay | Admitting: Pharmacist

## 2023-11-02 NOTE — Progress Notes (Signed)
 Pharmacy Quality Measure Review  This patient is appearing on a report for being at risk of failing the adherence measure for diabetes medications this calendar year.   Medication: metformin  XR 500 mg Last fill date: 07/18/23 for 90 day supply  Insurance report was not up to date. No action needed at this time.  Medication has been refilled as of 10/16/23 x90 ds. 1 additional 90d refill remaining. Next fill due 01/14/24.

## 2023-12-03 ENCOUNTER — Encounter: Payer: Self-pay | Admitting: Radiology

## 2024-01-15 ENCOUNTER — Other Ambulatory Visit: Payer: Self-pay | Admitting: Family Medicine

## 2024-01-15 DIAGNOSIS — I152 Hypertension secondary to endocrine disorders: Secondary | ICD-10-CM

## 2024-02-04 DIAGNOSIS — E1169 Type 2 diabetes mellitus with other specified complication: Secondary | ICD-10-CM

## 2024-02-04 DIAGNOSIS — D696 Thrombocytopenia, unspecified: Secondary | ICD-10-CM

## 2024-02-04 NOTE — Telephone Encounter (Signed)
 Copied from CRM [id number]. Topic: General - Other >> Feb 04, 2024  9:53 AM Tonda B wrote: Reason for CRM: patient daughter altamese calling patient needs lab order please call [phone]

## 2024-02-05 NOTE — Telephone Encounter (Signed)
 Plz notify labs ordered for CPE - they may come at their convenience for fasting labs.

## 2024-02-06 ENCOUNTER — Encounter: Payer: Self-pay | Admitting: Family Medicine

## 2024-02-06 NOTE — Telephone Encounter (Signed)
 Left message to return call to our office.    When patient calls back please schedule for lab only appointment.

## 2024-02-12 ENCOUNTER — Other Ambulatory Visit

## 2024-02-12 DIAGNOSIS — E1169 Type 2 diabetes mellitus with other specified complication: Secondary | ICD-10-CM

## 2024-02-12 DIAGNOSIS — D696 Thrombocytopenia, unspecified: Secondary | ICD-10-CM

## 2024-02-12 DIAGNOSIS — E785 Hyperlipidemia, unspecified: Secondary | ICD-10-CM | POA: Diagnosis not present

## 2024-02-12 LAB — COMPREHENSIVE METABOLIC PANEL WITH GFR
ALT: 16 U/L (ref 3–53)
AST: 20 U/L (ref 5–37)
Albumin: 4.3 g/dL (ref 3.5–5.2)
Alkaline Phosphatase: 66 U/L (ref 39–117)
BUN: 17 mg/dL (ref 6–23)
CO2: 32 meq/L (ref 19–32)
Calcium: 9.2 mg/dL (ref 8.4–10.5)
Chloride: 98 meq/L (ref 96–112)
Creatinine, Ser: 0.82 mg/dL (ref 0.40–1.50)
GFR: 81.07 mL/min
Glucose, Bld: 118 mg/dL — ABNORMAL HIGH (ref 70–99)
Potassium: 3.8 meq/L (ref 3.5–5.1)
Sodium: 136 meq/L (ref 135–145)
Total Bilirubin: 0.6 mg/dL (ref 0.2–1.2)
Total Protein: 6.9 g/dL (ref 6.0–8.3)

## 2024-02-12 LAB — CBC WITH DIFFERENTIAL/PLATELET
Basophils Absolute: 0 K/uL (ref 0.0–0.1)
Basophils Relative: 0.8 % (ref 0.0–3.0)
Eosinophils Absolute: 0.1 K/uL (ref 0.0–0.7)
Eosinophils Relative: 2.3 % (ref 0.0–5.0)
HCT: 40.9 % (ref 39.0–52.0)
Hemoglobin: 14.1 g/dL (ref 13.0–17.0)
Lymphocytes Relative: 29.6 % (ref 12.0–46.0)
Lymphs Abs: 1.6 K/uL (ref 0.7–4.0)
MCHC: 34.6 g/dL (ref 30.0–36.0)
MCV: 89.4 fl (ref 78.0–100.0)
Monocytes Absolute: 0.4 K/uL (ref 0.1–1.0)
Monocytes Relative: 7.4 % (ref 3.0–12.0)
Neutro Abs: 3.2 K/uL (ref 1.4–7.7)
Neutrophils Relative %: 59.9 % (ref 43.0–77.0)
Platelets: 99 K/uL — ABNORMAL LOW (ref 150.0–400.0)
RBC: 4.57 Mil/uL (ref 4.22–5.81)
RDW: 13.4 % (ref 11.5–15.5)
WBC: 5.3 K/uL (ref 4.0–10.5)

## 2024-02-12 LAB — LIPID PANEL
Cholesterol: 151 mg/dL (ref 28–200)
HDL: 38.8 mg/dL — ABNORMAL LOW
LDL Cholesterol: 78 mg/dL (ref 10–99)
NonHDL: 112.47
Total CHOL/HDL Ratio: 4
Triglycerides: 174 mg/dL — ABNORMAL HIGH (ref 10.0–149.0)
VLDL: 34.8 mg/dL (ref 0.0–40.0)

## 2024-02-12 LAB — MICROALBUMIN / CREATININE URINE RATIO
Creatinine,U: 80.4 mg/dL
Microalb Creat Ratio: UNDETERMINED mg/g (ref 0.0–30.0)
Microalb, Ur: <0.7 mg/dL

## 2024-02-12 LAB — VITAMIN B12: Vitamin B-12: 435 pg/mL (ref 211–911)

## 2024-02-12 LAB — HEMOGLOBIN A1C: Hgb A1c MFr Bld: 7.9 % — ABNORMAL HIGH (ref 4.6–6.5)

## 2024-02-14 ENCOUNTER — Ambulatory Visit: Payer: Self-pay | Admitting: Family Medicine

## 2024-02-18 ENCOUNTER — Encounter: Payer: 59 | Admitting: Family Medicine

## 2024-02-19 ENCOUNTER — Encounter: Payer: Self-pay | Admitting: Family Medicine

## 2024-02-19 ENCOUNTER — Ambulatory Visit: Payer: 59 | Admitting: Family Medicine

## 2024-02-19 VITALS — BP 126/78 | HR 64 | Temp 97.6°F | Ht 67.0 in | Wt 150.0 lb

## 2024-02-19 DIAGNOSIS — Z23 Encounter for immunization: Secondary | ICD-10-CM

## 2024-02-19 DIAGNOSIS — E781 Pure hyperglyceridemia: Secondary | ICD-10-CM

## 2024-02-19 DIAGNOSIS — I152 Hypertension secondary to endocrine disorders: Secondary | ICD-10-CM | POA: Diagnosis not present

## 2024-02-19 DIAGNOSIS — E1159 Type 2 diabetes mellitus with other circulatory complications: Secondary | ICD-10-CM

## 2024-02-19 DIAGNOSIS — H9193 Unspecified hearing loss, bilateral: Secondary | ICD-10-CM

## 2024-02-19 DIAGNOSIS — K219 Gastro-esophageal reflux disease without esophagitis: Secondary | ICD-10-CM

## 2024-02-19 DIAGNOSIS — G8929 Other chronic pain: Secondary | ICD-10-CM | POA: Diagnosis not present

## 2024-02-19 DIAGNOSIS — N401 Enlarged prostate with lower urinary tract symptoms: Secondary | ICD-10-CM | POA: Diagnosis not present

## 2024-02-19 DIAGNOSIS — R351 Nocturia: Secondary | ICD-10-CM

## 2024-02-19 DIAGNOSIS — Z0000 Encounter for general adult medical examination without abnormal findings: Secondary | ICD-10-CM | POA: Diagnosis not present

## 2024-02-19 DIAGNOSIS — E1169 Type 2 diabetes mellitus with other specified complication: Secondary | ICD-10-CM | POA: Diagnosis not present

## 2024-02-19 DIAGNOSIS — E785 Hyperlipidemia, unspecified: Secondary | ICD-10-CM | POA: Diagnosis not present

## 2024-02-19 DIAGNOSIS — D696 Thrombocytopenia, unspecified: Secondary | ICD-10-CM

## 2024-02-19 DIAGNOSIS — Z7189 Other specified counseling: Secondary | ICD-10-CM

## 2024-02-19 DIAGNOSIS — M25511 Pain in right shoulder: Secondary | ICD-10-CM | POA: Diagnosis not present

## 2024-02-19 MED ORDER — OMEPRAZOLE 40 MG PO CPDR: 40.0000 mg | DELAYED_RELEASE_CAPSULE | Freq: Every day | ORAL | 3 refills | Status: AC

## 2024-02-19 MED ORDER — LISINOPRIL-HYDROCHLOROTHIAZIDE 20-25 MG PO TABS: 1.0000 | ORAL_TABLET | Freq: Every day | ORAL | 3 refills | Status: AC

## 2024-02-19 MED ORDER — METFORMIN HCL ER 500 MG PO TB24: 1000.0000 mg | ORAL_TABLET | Freq: Every day | ORAL | 3 refills | Status: AC

## 2024-02-19 MED ORDER — LOVASTATIN 20 MG PO TABS: ORAL_TABLET | ORAL | 3 refills | Status: AC

## 2024-02-19 MED ORDER — ACCU-CHEK SOFTCLIX LANCETS MISC: 3 refills | Status: AC

## 2024-02-19 MED ORDER — TAMSULOSIN HCL 0.4 MG PO CAPS: 0.4000 mg | ORAL_CAPSULE | Freq: Every day | ORAL | 3 refills | Status: AC

## 2024-02-19 MED ORDER — PIOGLITAZONE HCL 15 MG PO TABS: 15.0000 mg | ORAL_TABLET | Freq: Every day | ORAL | 3 refills | Status: AC

## 2024-02-19 NOTE — Assessment & Plan Note (Signed)
 Chronic, thought ITP related. Continue to monitor. Previously saw hematology.

## 2024-02-19 NOTE — Assessment & Plan Note (Signed)
 See above - continue lovastatin . The ASCVD Risk score (Arnett DK, et al., 2019) failed to calculate for the following reasons:   The 2019 ASCVD risk score is only valid for ages 90 to 63   * - Cholesterol units were assumed

## 2024-02-19 NOTE — Assessment & Plan Note (Signed)
Chronic, stable. Continue zestoretic

## 2024-02-19 NOTE — Assessment & Plan Note (Addendum)
 Chronic, deteriorated control.  Will trial increased metformin  XR to 1000mg  nightly, monitoring for recurrent loose stools /GI upset. Continue actos  15mg  daily.  Reassess control at 6 mo f/u visit.  Diabetes associated with hypertension, dyslipidemia, GERD.

## 2024-02-19 NOTE — Assessment & Plan Note (Addendum)
 Chronic, anticipate driven by hyperglycemia. Reviewed diet choices to control triglycerides. Continue lovastatin .

## 2024-02-19 NOTE — Assessment & Plan Note (Signed)
 Chronic, stable period on nightly flomax .

## 2024-02-19 NOTE — Assessment & Plan Note (Signed)
 Preventative protocols reviewed and updated unless pt declined. Discussed healthy diet and lifestyle.

## 2024-02-19 NOTE — Telephone Encounter (Signed)
 July appt is for AMW with health advisor  Needs DM f/u with me in 6 months - please schedule

## 2024-02-19 NOTE — Progress Notes (Signed)
 " Ph: [phone] Fax: [phone]   Patient ID: Ricky Garrett, male    DOB: 03/30/1940, 84 y.o.   MRN: [id number]  This visit was conducted in person.  BP 126/78 (BP Location: Right Arm, Patient Position: Sitting, Cuff Size: Normal)   Pulse 64   Temp 97.6 F (36.4 C) (Oral)   Ht 5' 7 (1.702 m)   Wt 150 lb (68 kg)   SpO2 96%   BMI 23.49 kg/m    CC: CPE Subjective:   HPI: Ricky Garrett is a 84 y.o. male presenting on 02/19/2024 for Annual Exam (Pt fell last year and hurt right arm and was seen at orthopedic, did one PT session and then continued doing exercises at home but he still feels cramping pain when he lifts his arm up//FYI in August pt has squamous cell cancer removed off face by [facility], he is now following up his care at [facility] dermatology//Pt's daughter is in room)   Saw health advisor 07/2023 for medicare wellness visit. Note reviewed.   No results found.  Flowsheet Row Office Visit from 02/19/2024 in [facility] [place]  PHQ-2 Total Score 0       02/19/2024   12:08 PM 08/10/2023   12:44 PM 08/02/2023   11:01 AM 08/01/2022   10:50 AM 07/26/2021   11:25 AM  Fall Risk   Falls in the past year? 0 0 0 0 0  Number falls in past yr: 0  0 0 0  Injury with Fall? 0  0  0  0   Risk for fall due to : No Fall Risks  No Fall Risks Medication side effect No Fall Risks  Follow up Falls evaluation completed Falls evaluation completed Education provided;Falls prevention discussed Falls prevention discussed;Falls evaluation completed Falls prevention discussed      Data saved with a previous flowsheet row definition    DOI: 07/2023 - fall in airport in [place]. Saw ortho 08/2023 s/p steroid shoulder injection with limited benefit, thought RTC contusion. Saw PT x1, has continued home exercises with limited benefit.   Saw [facility] Dr Burnette for diarrhea and GERD/dysphagia s/p CT scan 10/2021 - no obstruction, there were fatty liver and kidney  cysts and enlarged prostate. EGD showed significant GERD with mild diffuse esophageal dysmotility. Latest visit 12/2022 - continued omeprazole  40mg  BID, add IBGard BID PRN - attributed symptoms to metformin . Fecal fat, pancreatic elastase normal. IBGard has helped.   DM - on low dose metformin  XR 500mg  daily, actos  15mg  daily. Fasting this morning 135.  BPH - on flomax  0.4mg  nightly.   Preventative: COLONOSCOPY 01/2014 - diverticulosis, 2 polyps, hemorrhoids, rpt 5 yrs (Outlaw)  Virtual colonoscopy 07/2019 - large amt retained barium througout colon, no polyps or masses. Incidental 2.4cm hypodensity to R kidney likely cyst. aged out.  Prostate cancer screening - known BPH on terazosin  - stopped 2023. Aged out of prostate cancer screening. Nocturia x2.  Lung cancer screening - not eligible  Flu shot - yearly COVID vaccine Pfizer 01/2019, 03/2019, booster x2 10/2019, 06/2020  Tdap 2017 Pneumovax 2017, prevnar-13 2020, prevnar-20 today Zostavax - 01/2017 - may have received expired Shingrix - 12/2018, 03/2019  Advanced directive discussion - does not have set up. Unsure who would be HCPOA. Packet again provided today.  Seat belt use discussed  Sunscreen use discussed. Sees derm yearly - had squamous cell to face.  Ex smoker - quit 1990  Alcohol  - rare  Dentist - yearly  Eye exam - yearly Bowel - no constipation  Bladder - no incontinence   Lives with daughter [name]) and her husband, 2 grand children, and wife [place]  ([place]). Was living in [place] and moved to [place] first and then to [facility] (2008).  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 Cares for grandchildren. Edu - elementary Living in [place] with wife and daughter's family. Activity: walking regularly Diet: good water, fruits/vegetables daily      Relevant past medical, surgical, family and social history reviewed and updated as indicated. Interim medical history since our last visit  reviewed. Allergies and medications reviewed and updated. Outpatient Medications Prior to Visit  Medication Sig Dispense Refill   aspirin  EC 81 MG tablet Take 1 tablet (81 mg total) by mouth every Monday, Wednesday, and Friday.     glucose blood (ACCU-CHEK AVIVA PLUS) test strip Use as instructed to check sugars twice daily E11.69 200 strip 3   Multiple Vitamins-Minerals (CENTRUM SILVER ) tablet Take 1 tablet by mouth daily.     mupirocin  ointment (BACTROBAN ) 2 % Apply 1 Application topically 2 (two) times daily. To affected area till better 22 g 0   Accu-Chek Softclix Lancets lancets Use as instructed to check sugars twice daily E11.69 200 each 3   lisinopril -hydrochlorothiazide  (ZESTORETIC ) 20-25 MG tablet TAKE 1 TABLET BY MOUTH EVERY DAY 90 tablet 1   lovastatin  (MEVACOR ) 20 MG tablet Take 1 tablet nightly for cholesterol 90 tablet 4   metFORMIN  (GLUCOPHAGE -XR) 500 MG 24 hr tablet Take 1 tablet (500 mg total) by mouth daily with supper. 90 tablet 1   omeprazole  (PRILOSEC) 40 MG capsule Take 1 capsule (40 mg total) by mouth daily. 90 capsule 4   pioglitazone  (ACTOS ) 15 MG tablet Take 1 tablet (15 mg total) by mouth daily. 90 tablet 4   tamsulosin  (FLOMAX ) 0.4 MG CAPS capsule Take 1 capsule (0.4 mg total) by mouth daily. 30 capsule 11   Peppermint Oil (IBGARD PO) Take by mouth as needed. (Patient not taking: Reported on 02/19/2024)     No facility-administered medications prior to visit.     Per HPI unless specifically indicated in ROS section below Review of Systems  Constitutional:  Negative for activity change, appetite change, chills, fatigue, fever and unexpected weight change.  HENT:  Negative for hearing loss.   Eyes:  Negative for visual disturbance.  Respiratory:  Negative for cough, chest tightness, shortness of breath and wheezing.   Cardiovascular:  Positive for leg swelling (with trips). Negative for chest pain and palpitations.  Gastrointestinal:  Negative for abdominal  distention, abdominal pain, blood in stool, constipation, diarrhea, nausea and vomiting.  Genitourinary:  Negative for difficulty urinating and hematuria.  Musculoskeletal:  Positive for arthralgias. Negative for myalgias and neck pain.       R shoulder pain  Skin:  Negative for rash.  Neurological:  Positive for dizziness (rare). Negative for seizures, syncope and headaches.  Hematological:  Negative for adenopathy. Does not bruise/bleed easily.  Psychiatric/Behavioral:  Negative for dysphoric mood. The patient is nervous/anxious (some).     Objective:  BP 126/78 (BP Location: Right Arm, Patient Position: Sitting, Cuff Size: Normal)   Pulse 64   Temp 97.6 F (36.4 C) (Oral)   Ht 5' 7 (1.702 m)   Wt 150 lb (68 kg)   SpO2 96%   BMI 23.49 kg/m   Wt Readings from Last 3 Encounters:  02/19/24 150 lb (68 kg)  08/10/23 148 lb 6  oz (67.3 kg)  08/02/23 149 lb (67.6 kg)      Physical Exam Vitals and nursing note reviewed.  Constitutional:      General: He is not in acute distress.    Appearance: Normal appearance. He is well-developed. He is not ill-appearing.  HENT:     Head: Normocephalic and atraumatic.     Right Ear: Hearing, tympanic membrane, ear canal and external ear normal.     Left Ear: Hearing, tympanic membrane, ear canal and external ear normal.     Mouth/Throat:     Mouth: Mucous membranes are moist.     Pharynx: Oropharynx is clear. No oropharyngeal exudate or posterior oropharyngeal erythema.  Eyes:     General: No scleral icterus.    Extraocular Movements: Extraocular movements intact.     Conjunctiva/sclera: Conjunctivae normal.     Pupils: Pupils are equal, round, and reactive to light.  Neck:     Thyroid : No thyroid  mass or thyromegaly.  Cardiovascular:     Rate and Rhythm: Normal rate and regular rhythm.     Pulses: Normal pulses.          Radial pulses are 2+ on the right side and 2+ on the left side.     Heart sounds: Normal heart sounds. No murmur  heard. Pulmonary:     Effort: Pulmonary effort is normal. No respiratory distress.     Breath sounds: Normal breath sounds. No wheezing, rhonchi or rales.  Abdominal:     General: Bowel sounds are normal. There is no distension.     Palpations: Abdomen is soft. There is no mass.     Tenderness: There is no abdominal tenderness. There is no guarding or rebound.     Hernia: No hernia is present.  Musculoskeletal:        General: Normal range of motion.     Cervical back: Normal range of motion and neck supple.     Right lower leg: No edema.     Left lower leg: No edema.     Comments:  L shoulder WNL R shoulder exam: No deformity of shoulders on inspection. Discomfort with palpation of anterior shoulder into bicipital groove. Limited ROM in abduction past 90d due to pain. FROM forward flexion. No significant pain or weakness with testing SITS in ext/int rotation. No significant pain with empty can sign. + Speed test. No significant impingement appreciated. + pain with rotation of humeral head in [facility] joint.   Lymphadenopathy:     Cervical: No cervical adenopathy.  Skin:    General: Skin is warm and dry.     Findings: No rash.  Neurological:     General: No focal deficit present.     Mental Status: He is alert and oriented to person, place, and time.  Psychiatric:        Mood and Affect: Mood normal.        Behavior: Behavior normal.        Thought Content: Thought content normal.        Judgment: Judgment normal.       Results for orders placed or performed in visit on 02/12/24  CBC with Differential/Platelet   Collection Time: 02/12/24 10:12 AM  Result Value Ref Range   WBC 5.3 4.0 - 10.5 K/uL   RBC 4.57 4.22 - 5.81 Mil/uL   Hemoglobin 14.1 13.0 - 17.0 g/dL   HCT 59.0 60.9 - 47.9 %   MCV 89.4 78.0 - 100.0 fl   MCHC 34.6  30.0 - 36.0 g/dL   RDW 86.5 88.4 - 84.4 %   Platelets 99.0 (L) 150.0 - 400.0 K/uL   Neutrophils Relative % 59.9 43.0 - 77.0 %   Lymphocytes Relative  29.6 12.0 - 46.0 %   Monocytes Relative 7.4 3.0 - 12.0 %   Eosinophils Relative 2.3 0.0 - 5.0 %   Basophils Relative 0.8 0.0 - 3.0 %   Neutro Abs 3.2 1.4 - 7.7 K/uL   Lymphs Abs 1.6 0.7 - 4.0 K/uL   Monocytes Absolute 0.4 0.1 - 1.0 K/uL   Eosinophils Absolute 0.1 0.0 - 0.7 K/uL   Basophils Absolute 0.0 0.0 - 0.1 K/uL  Vitamin B12   Collection Time: 02/12/24 10:12 AM  Result Value Ref Range   Vitamin B-12 435 211 - 911 pg/mL  Comprehensive metabolic panel with GFR   Collection Time: 02/12/24 10:12 AM  Result Value Ref Range   Sodium 136 135 - 145 mEq/L   Potassium 3.8 3.5 - 5.1 mEq/L   Chloride 98 96 - 112 mEq/L   CO2 32 19 - 32 mEq/L   Glucose, Bld 118 (H) 70 - 99 mg/dL   BUN 17 6 - 23 mg/dL   Creatinine, Ser 9.17 0.40 - 1.50 mg/dL   Total Bilirubin 0.6 0.2 - 1.2 mg/dL   Alkaline Phosphatase 66 39 - 117 U/L   AST 20 5 - 37 U/L   ALT 16 3 - 53 U/L   Total Protein 6.9 6.0 - 8.3 g/dL   Albumin 4.3 3.5 - 5.2 g/dL   GFR 18.92 >39.99 mL/min   Calcium 9.2 8.4 - 10.5 mg/dL  Lipid panel   Collection Time: 02/12/24 10:12 AM  Result Value Ref Range   Cholesterol 151 28 - 200 mg/dL   Triglycerides 825.9 (H) 10.0 - 149.0 mg/dL   HDL 61.19 (L) >60.99 mg/dL   VLDL 65.1 0.0 - 59.9 mg/dL   LDL Cholesterol 78 10 - 99 mg/dL   Total CHOL/HDL Ratio 4    NonHDL 112.47   Microalbumin / creatinine urine ratio   Collection Time: 02/12/24 10:12 AM  Result Value Ref Range   Microalb, Ur <0.7 mg/dL   Creatinine,U 19.5 mg/dL   Microalb Creat Ratio Unable to calculate 0.0 - 30.0 mg/g  Hemoglobin A1c   Collection Time: 02/12/24 10:12 AM  Result Value Ref Range   Hgb A1c MFr Bld 7.9 (H) 4.6 - 6.5 %    Assessment & Plan:   Problem List Items Addressed This Visit     Health maintenance examination - Primary (Chronic)   Preventative protocols reviewed and updated unless pt declined. Discussed healthy diet and lifestyle.       Advanced care planning/counseling discussion (Chronic)    Advanced directive discussion - does not have set up. Unsure who would be HCPOA. Packet again provided today.       Dyslipidemia associated with type 2 diabetes mellitus (HCC)   See above - continue lovastatin . The ASCVD Risk score (Arnett DK, et al., 2019) failed to calculate for the following reasons:   The 2019 ASCVD risk score is only valid for ages 43 to 60   * - Cholesterol units were assumed       Relevant Medications   lisinopril -hydrochlorothiazide  (ZESTORETIC ) 20-25 MG tablet   lovastatin  (MEVACOR ) 20 MG tablet   metFORMIN  (GLUCOPHAGE -XR) 500 MG 24 hr tablet   pioglitazone  (ACTOS ) 15 MG tablet   Thrombocytopenia   Chronic, thought ITP related. Continue to monitor. Previously saw hematology.  Hypertension associated with diabetes (HCC)   Chronic, stable. Continue zestoretic .       Relevant Medications   lisinopril -hydrochlorothiazide  (ZESTORETIC ) 20-25 MG tablet   lovastatin  (MEVACOR ) 20 MG tablet   metFORMIN  (GLUCOPHAGE -XR) 500 MG 24 hr tablet   pioglitazone  (ACTOS ) 15 MG tablet   GERD (gastroesophageal reflux disease)   Chronic, stable period on omperazole 40mg  daily. H/o GI ulcers remotely.       Relevant Medications   omeprazole  (PRILOSEC) 40 MG capsule   Type 2 diabetes mellitus with other specified complication (HCC)   Chronic, deteriorated control.  Will trial increased metformin  XR to 1000mg  nightly, monitoring for recurrent loose stools /GI upset. Continue actos  15mg  daily.  Reassess control at 6 mo f/u visit.  Diabetes associated with hypertension, dyslipidemia, GERD.       Relevant Medications   lisinopril -hydrochlorothiazide  (ZESTORETIC ) 20-25 MG tablet   lovastatin  (MEVACOR ) 20 MG tablet   metFORMIN  (GLUCOPHAGE -XR) 500 MG 24 hr tablet   pioglitazone  (ACTOS ) 15 MG tablet   BPH (benign prostatic hyperplasia)   Chronic, stable period on nightly flomax .       Relevant Medications   tamsulosin  (FLOMAX ) 0.4 MG CAPS capsule    Hypertriglyceridemia   Chronic, anticipate driven by hyperglycemia. Reviewed diet choices to control triglycerides. Continue lovastatin .       Relevant Medications   lisinopril -hydrochlorothiazide  (ZESTORETIC ) 20-25 MG tablet   lovastatin  (MEVACOR ) 20 MG tablet   Decreased hearing of both ears   Forgot hearing aides today.       Right shoulder pain   Chronic issue since fall 07/2023 Initially thought RTC contusion, but not improved despite steroid shoulder injection, PT x1 session + HEP.  Suspect labral tear and biceps tendonitis based on exam today.  Provided with exercises for biceps tendinitis and suggested ortho f/u if not improving.       Other Visit Diagnoses       Need for vaccination against Streptococcus pneumoniae       Relevant Orders   Pneumococcal conjugate vaccine 20-valent (Prevnar 20) (Completed)        Meds ordered this encounter  Medications   Accu-Chek Softclix Lancets lancets    Sig: Use as instructed to check sugars twice daily E11.69    Dispense:  200 each    Refill:  3   lisinopril -hydrochlorothiazide  (ZESTORETIC ) 20-25 MG tablet    Sig: Take 1 tablet by mouth daily.    Dispense:  90 tablet    Refill:  3   lovastatin  (MEVACOR ) 20 MG tablet    Sig: Take 1 tablet nightly for cholesterol    Dispense:  90 tablet    Refill:  3   metFORMIN  (GLUCOPHAGE -XR) 500 MG 24 hr tablet    Sig: Take 2 tablets (1,000 mg total) by mouth daily with supper.    Dispense:  180 tablet    Refill:  3   omeprazole  (PRILOSEC) 40 MG capsule    Sig: Take 1 capsule (40 mg total) by mouth daily.    Dispense:  90 capsule    Refill:  3   pioglitazone  (ACTOS ) 15 MG tablet    Sig: Take 1 tablet (15 mg total) by mouth daily.    Dispense:  90 tablet    Refill:  3   tamsulosin  (FLOMAX ) 0.4 MG CAPS capsule    Sig: Take 1 capsule (0.4 mg total) by mouth daily.    Dispense:  90 capsule    Refill:  3    Orders Placed This Encounter  Procedures   Pneumococcal conjugate vaccine  20-valent (Prevnar 20)    Patient Instructions  Prevnar-20 today Azucar esta elevada con A1c 7.9% - suba metformina XR a 2 por la noche. Siga piogiltazone 30mg  diarios por la maana.  Me pregunto si tiene desagrro del labrum (articulacion de hombro) y/o tendonitis del tendon biceps. Puede usar crema voltaren. Tambien le dare nuevos ejercicios para tratar. Haga seguimiento con orthopedista.  Regresar en 6 meses para seguimiento de diabetes.   Follow up plan: Return in about 6 months (around 08/18/2024) for follow up visit.  Anton Blas, MD   "

## 2024-02-19 NOTE — Patient Instructions (Addendum)
 Prevnar-20 today Azucar esta elevada con A1c 7.9% - suba metformina XR a 2 por la noche. Siga piogiltazone 30mg  diarios por la maana.  Me pregunto si tiene desagrro del labrum (articulacion de hombro) y/o tendonitis del tendon biceps. Puede usar crema voltaren. Tambien le dare nuevos ejercicios para tratar. Haga seguimiento con orthopedista.  Regresar en 6 meses para seguimiento de diabetes.

## 2024-02-19 NOTE — Assessment & Plan Note (Signed)
Forgot hearing aides today.

## 2024-02-19 NOTE — Assessment & Plan Note (Signed)
 Chronic, stable period on omperazole 40mg  daily. H/o GI ulcers remotely.

## 2024-02-19 NOTE — Assessment & Plan Note (Addendum)
 Chronic issue since fall 07/2023 Initially thought RTC contusion, but not improved despite steroid shoulder injection, PT x1 session + HEP.  Suspect labral tear and biceps tendonitis based on exam today.  Provided with exercises for biceps tendinitis and suggested ortho f/u if not improving.

## 2024-02-19 NOTE — Assessment & Plan Note (Signed)
 Advanced directive discussion - does not have set up. Unsure who would be HCPOA. Packet again provided today.

## 2024-02-28 ENCOUNTER — Ambulatory Visit: Admitting: Orthopaedic Surgery

## 2024-02-28 NOTE — Telephone Encounter (Signed)
 Called and schedule pt for 6 month f/u

## 2024-02-29 ENCOUNTER — Other Ambulatory Visit: Payer: Self-pay

## 2024-02-29 ENCOUNTER — Encounter: Payer: Self-pay | Admitting: Physician Assistant

## 2024-02-29 ENCOUNTER — Ambulatory Visit (INDEPENDENT_AMBULATORY_CARE_PROVIDER_SITE_OTHER): Admitting: Physician Assistant

## 2024-02-29 ENCOUNTER — Other Ambulatory Visit: Payer: Self-pay | Admitting: Physician Assistant

## 2024-02-29 DIAGNOSIS — M25511 Pain in right shoulder: Secondary | ICD-10-CM

## 2024-02-29 NOTE — Progress Notes (Signed)
 "  Office Visit Note   Patient: Ricky Garrett           Date of Birth: Nov 26, 1940           MRN: [id number] Visit Date: 02/29/2024              Requested by: Rilla Baller, MD [address] [place],  [place] [address] PCP: Rilla Baller, MD   Assessment & Plan: Visit Diagnoses:  1. Acute pain of right shoulder     Plan: Patient is a pleasant 84 year old gentleman who is seen with the help of a Spanish interpreter.  He has had a 74-month history of right shoulder pain [place] airport.  When I last saw him shortly after the injury I did give him a subacromial injection which she said only helped for 2 or 3 weeks.  He has worked both with physical therapy and in person and is currently doing home exercises that they have taught him faithfully.  He has not improved.  His exam today he does have some crepitus and some weakness.  Also pain with forward elevation internal rotation behind his back.  Does not have really much pain with movement of his neck.  Because of his failed treatment I think at this point an MRI is appropriate.  Will then review this and make necessary referral to Dr. Addie if needed  Follow-Up Instructions: No follow-ups on file.   Orders:  No orders of the defined types were placed in this encounter.  No orders of the defined types were placed in this encounter.     Procedures: No procedures performed   Clinical Data: No additional findings.   Subjective: Chief Complaint  Patient presents with   Right Shoulder - Pain    HPI patient is a pleasant 84 year old gentleman seen with the help of a Spanish interpreter today.  He complains of right shoulder pain that radiates down his arm he had a fall last year and came in and had an injection.  He said the injection helped but now is worn off.  He does feel some numbness.  He often has a sharp pain that radiates to the elbow he has done physical therapy  Review of Systems  All other systems  reviewed and are negative.    Objective: Vital Signs: There were no vitals taken for this visit.  Physical Exam Constitutional:      Appearance: Normal appearance.  Skin:    General: Skin is warm and dry.  Neurological:     General: No focal deficit present.     Mental Status: He is alert and oriented to person, place, and time.  Psychiatric:        Mood and Affect: Mood normal.        Behavior: Behavior normal.     Ortho Exam Examination of his right shoulder he can forward elevate actively though this is painful he does have some crepitus that is palpable when he is doing this pain with coming down from forward elevation difficulty with internal rotation behind the back he has fair strength with resisted abduction external/internal rotation.  No real pain with external rotation passively he does have a positive empty can test positive speeds test Specialty Comments:  No specialty comments available.  Imaging: No results found.   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 09/16/2015   BPH (benign prostatic hyperplasia) 01/06/2014   Tubular adenoma of colon 09/12/2013   Type 2 diabetes mellitus with other specified complication (HCC) 08/20/2012   Insomnia 03/26/2012   GERD (gastroesophageal reflux disease) 09/13/2011   Thrombocytopenia 12/30/2009   Unintentional weight loss 03/18/2009   Dyslipidemia associated with type 2 diabetes mellitus (HCC) 06/13/2007   Allergic rhinitis 06/13/2007   Hypertension associated with diabetes (HCC) 09/28/2006    Past Medical History:  Diagnosis Date   Cancer of skin of left ear 2019   seen by a dermatologist in [place]   Decreased hearing    pt declines   Diabetes mellitus without complication (HCC)    GERD (gastroesophageal reflux disease)    History of stomach ulcers    Hyperlipidemia    Hypertension    Pigmented skin lesion 03/2020   R occipital scalp - pigmented lichen planus-like keratosis ([place])   Rhinitis, allergic    Thrombocytopenia     Family History  Problem Relation Age of Onset   CAD Neg Hx    Stroke Neg Hx    Diabetes Neg Hx    Cancer Neg Hx     Past Surgical History:  Procedure Laterality Date   CATARACT EXTRACTION Right 10/2022   CATARACT EXTRACTION Left 12/2022   COLONOSCOPY  2011   1 polyp (outlaw)   COLONOSCOPY  01/2014   diverticulosis, 2 polyps, hemorrhoids, rpt 5 yrs (Outlaw)   NASAL SEPTUM SURGERY  1990   Social History   Occupational History    Employer: [employer]  Tobacco Use   Smoking status: Former    Current packs/day: 0.00    Average packs/day: 0.5 packs/day for 1 year (0.5 ttl pk-yrs)    Types: Cigarettes    Start date: 1989    Quit date: 1990    Years since quitting: 36.1   Smokeless tobacco: Never  Vaping Use   Vaping status: Never Used  Substance and Sexual Activity   Alcohol use: Not Currently    Alcohol/week: 1.0 standard drink of alcohol    Types: 1 Cans of beer per week   Drug use: Never   Sexual activity: Not Currently    Partners: Female    Birth control/protection: None        "

## 2024-03-04 ENCOUNTER — Encounter: Payer: Self-pay | Admitting: Physician Assistant

## 2024-03-06 ENCOUNTER — Encounter (HOSPITAL_BASED_OUTPATIENT_CLINIC_OR_DEPARTMENT_OTHER): Payer: Self-pay | Admitting: Emergency Medicine

## 2024-03-06 ENCOUNTER — Other Ambulatory Visit: Payer: Self-pay

## 2024-03-06 ENCOUNTER — Ambulatory Visit: Admitting: Orthopaedic Surgery

## 2024-03-06 ENCOUNTER — Emergency Department (HOSPITAL_BASED_OUTPATIENT_CLINIC_OR_DEPARTMENT_OTHER): Admitting: Radiology

## 2024-03-06 ENCOUNTER — Emergency Department (HOSPITAL_BASED_OUTPATIENT_CLINIC_OR_DEPARTMENT_OTHER)
Admission: EM | Admit: 2024-03-06 | Discharge: 2024-03-06 | Disposition: A | Discharge status: Home / Self Care | Source: Home / Self Care | Attending: Emergency Medicine | Admitting: Emergency Medicine

## 2024-03-06 ENCOUNTER — Encounter: Payer: Self-pay | Admitting: Family Medicine

## 2024-03-06 ENCOUNTER — Emergency Department (HOSPITAL_BASED_OUTPATIENT_CLINIC_OR_DEPARTMENT_OTHER)

## 2024-03-06 DIAGNOSIS — M25512 Pain in left shoulder: Secondary | ICD-10-CM

## 2024-03-06 DIAGNOSIS — S0081XA Abrasion of other part of head, initial encounter: Secondary | ICD-10-CM

## 2024-03-06 DIAGNOSIS — W19XXXA Unspecified fall, initial encounter: Secondary | ICD-10-CM

## 2024-03-06 NOTE — ED Provider Notes (Cosign Needed)
 " [place] EMERGENCY DEPARTMENT AT [facility] Provider Note   CSN: [id number] Arrival date & time: 03/06/24  1048     Patient presents with: Ricky Garrett is a 84 y.o. male.   Patient with history of diabetes, high cholesterol, hypertension, GERD --presents to the emergency department today for evaluation of slip and fall and associated injuries.  Patient was outside of his house around 9:30 AM today.  He slipped on ice.  He sustained an abrasion to the left face.  He reports pain in the left shoulder.  Patient was able to get himself up, ambulate inside, wash his face off.  Patient's family member noted the injuries at that time.  No subsequent confusion or vomiting.  Patient is not on anticoagulation.  Denies hip pain.  No neck pain.  No chest or abdomen pain.  No difficulty breathing.  Tetanus is up-to-date per records.  Received Tdap 2017.  Patient's family member at bedside interprets at request of patient.       Prior to Admission medications  Medication Sig Start Date End Date Taking? Authorizing Provider  Accu-Chek Softclix Lancets lancets Use as instructed to check sugars twice daily E11.69 02/19/24   Rilla Baller, MD  aspirin  EC 81 MG tablet Take 1 tablet (81 mg total) by mouth every Monday, Wednesday, and Friday. 06/25/19   Rilla Baller, MD  glucose blood (ACCU-CHEK AVIVA PLUS) test strip Use as instructed to check sugars twice daily E11.69 02/14/23   Rilla Baller, MD  lisinopril -hydrochlorothiazide  (ZESTORETIC ) 20-25 MG tablet Take 1 tablet by mouth daily. 02/19/24   Rilla Baller, MD  lovastatin  (MEVACOR ) 20 MG tablet Take 1 tablet nightly for cholesterol 02/19/24   Rilla Baller, MD  metFORMIN  (GLUCOPHAGE -XR) 500 MG 24 hr tablet Take 2 tablets (1,000 mg total) by mouth daily with supper. 02/19/24   Rilla Baller, MD  Multiple Vitamins-Minerals (CENTRUM SILVER ) tablet Take 1 tablet by mouth daily. 06/04/17   Kristy Sharolyn Lenis, PA-C   mupirocin  ointment (BACTROBAN ) 2 % Apply 1 Application topically 2 (two) times daily. To affected area till better 09/28/23   Vonna Sharlet POUR, MD  omeprazole  (PRILOSEC) 40 MG capsule Take 1 capsule (40 mg total) by mouth daily. 02/19/24   Rilla Baller, MD  Peppermint Oil (IBGARD PO) Take by mouth as needed. Patient not taking: Reported on 02/19/2024    [provider]  pioglitazone  (ACTOS ) 15 MG tablet Take 1 tablet (15 mg total) by mouth daily. 02/19/24   Rilla Baller, MD  tamsulosin  (FLOMAX ) 0.4 MG CAPS capsule Take 1 capsule (0.4 mg total) by mouth daily. 02/19/24   Rilla Baller, MD    Allergies: Patient has no known allergies.    Review of Systems  Updated Vital Signs BP (!) 174/88 (BP Location: Right Arm)   Pulse 76   Temp 98.2 F (36.8 C) (Oral)   Resp 15   SpO2 96%   Physical Exam Vitals and nursing note reviewed.  Constitutional:      Appearance: He is well-developed.  HENT:     Head: Normocephalic. Abrasion present. No raccoon eyes or Battle's sign.      Comments: No tenderness over nasal bones.  No malocclusion of jaw.    Right Ear: External ear normal.     Left Ear: External ear normal.     Nose: Nose normal.  Eyes:     General: Lids are normal.     Conjunctiva/sclera: Conjunctivae normal.     Pupils: Pupils are equal,  round, and reactive to light.     Comments: No visible hyphema  Cardiovascular:     Rate and Rhythm: Normal rate and regular rhythm.  Pulmonary:     Effort: Pulmonary effort is normal.     Breath sounds: Normal breath sounds.  Abdominal:     Palpations: Abdomen is soft.     Tenderness: There is no abdominal tenderness. There is no guarding or rebound.  Musculoskeletal:     Right shoulder: No tenderness. Normal range of motion.     Left shoulder: Tenderness present. Decreased range of motion.     Right upper arm: No swelling or tenderness.     Left upper arm: No swelling or tenderness.     Right elbow: Normal range of  motion. No tenderness.     Left elbow: Normal range of motion. Tenderness present.     Right forearm: No tenderness.     Left forearm: No tenderness.     Right wrist: No tenderness. Normal range of motion.     Left wrist: No tenderness. Normal range of motion.     Cervical back: Normal range of motion and neck supple. No tenderness or bony tenderness.     Thoracic back: No tenderness or bony tenderness.     Lumbar back: No tenderness or bony tenderness.     Right hip: No tenderness. Normal range of motion.     Left hip: No tenderness. Normal range of motion.     Right knee: Normal range of motion. No tenderness.     Left knee: Normal range of motion. No tenderness.  Skin:    General: Skin is warm and dry.  Neurological:     Mental Status: He is alert and oriented to person, place, and time.     GCS: GCS eye subscore is 4. GCS verbal subscore is 5. GCS motor subscore is 6.     Cranial Nerves: No cranial nerve deficit.     Sensory: No sensory deficit.     Coordination: Coordination normal.     (all labs ordered are listed, but only abnormal results are displayed) Labs Reviewed - No data to display  EKG: None  Radiology: CT Head Wo Contrast Result Date: 03/06/2024 EXAM: CT HEAD WITHOUT CONTRAST 03/06/2024 11:47:55 AM TECHNIQUE: CT of the head was performed without the administration of intravenous contrast. Automated exposure control, iterative reconstruction, and/or weight based adjustment of the mA/kV was utilized to reduce the radiation dose to as low as reasonably achievable. COMPARISON: None available. CLINICAL HISTORY: Fall. FINDINGS: BRAIN AND VENTRICLES: No acute hemorrhage. No evidence of acute infarct. No hydrocephalus. Diffuse parenchymal volume loss with extensive extra-axial spaces over the frontal convexities likely related to volume loss. No definite extra-axial fluid collection. No mass effect or midline shift. Mild chronic microvascular ischemic changes. ORBITS: Bilateral  lens replacement. SINUSES: Bilateral displaced nasal bone fractures without significant overlying soft tissue swelling. Findings are likely chronic. Recommended correlation with focal tenderness on exam. SOFT TISSUES AND SKULL: Soft tissue swelling in the left temporal scalp with prominence and mild swelling of the underlying left temporalis muscle. Additional possible focus of soft tissue swelling in the right frontal scalp just anterior to the vertex. No skull fracture. IMPRESSION: 1. No acute intracranial abnormality. 2. Soft tissue swelling in the left temporal scalp with prominence and mild swelling of the underlying left temporalis muscle. 3. Additional possible focus of soft tissue swelling in the right frontal scalp just anterior to the vertex. 4. Bilateral displaced nasal  bone fractures without significant overlying soft tissue swelling, likely chronic. Electronically signed by: Donnice Mania MD 03/06/2024 12:10 PM EST RP Workstation: [id number]   DG Shoulder Left Result Date: 03/06/2024 EXAM: 1 VIEW(S) XRAY OF THE LEFT SHOULDER 03/06/2024 11:47:07 AM COMPARISON: None available. CLINICAL HISTORY: Fall. FINDINGS: BONES AND JOINTS: High-riding humeral head in relation to the undersurface of the acromion, which could reflect rotator cuff pathology. Mild degenerative changes of the acromioclavicular joint. Mild osteophytes along the glenoid. No acute fracture. SOFT TISSUES: No abnormal calcifications. Visualized lung is unremarkable. IMPRESSION: 1. High-riding humeral head in relation to the undersurface of the acromion, which could reflect rotator cuff pathology. 2. No evidence of acute fracture. Electronically signed by: Donnice Mania MD 03/06/2024 12:04 PM EST RP Workstation: [id number]   DG Elbow Complete Left Result Date: 03/06/2024 EXAM: 3 or more VIEW(S) XRAY OF THE LEFT ELBOW COMPARISON: None available. CLINICAL HISTORY: Fall. FINDINGS: BONES AND JOINTS: No acute fracture. No malalignment. SOFT  TISSUES: Unremarkable. IMPRESSION: 1. No acute fracture or dislocation. Electronically signed by: Donnice Mania MD 03/06/2024 12:01 PM EST RP Workstation: [id number]   CT Cervical Spine Wo Contrast Result Date: 03/06/2024 EXAM: CT CERVICAL SPINE WITHOUT CONTRAST 03/06/2024 11:47:55 AM TECHNIQUE: CT of the cervical spine was performed without the administration of intravenous contrast. Multiplanar reformatted images are provided for review. Automated exposure control, iterative reconstruction, and/or weight based adjustment of the mA/kV was utilized to reduce the radiation dose to as low as reasonably achievable. COMPARISON: None available. CLINICAL HISTORY: Fall. FINDINGS: BONES AND ALIGNMENT: There is straightening of the normal cervical lordosis. Trace degenerative anterolisthesis of C4 on C5. No evidence of traumatic malalignment. No evidence of acute fracture in the cervical spine. There is a focal chronic appearing irregularity at the anterior aspect of the T2 superior endplate. Vertebral body heights are maintained. DEGENERATIVE CHANGES: Disc space narrowing most pronounced at C5-C6. No high grade osseous spinal canal stenosis. Mild facet arthrosis at multiple levels. SOFT TISSUES: No prevertebral soft tissue swelling. IMPRESSION: 1. No acute fracture or traumatic malalignment of the cervical spine. 2. Focal chronic appearing irregularity at the anterior aspect of the T2 superior endplate. Electronically signed by: Donnice Mania MD 03/06/2024 11:59 AM EST RP Workstation: [id number]     Procedures   Medications Ordered in the ED - No data to display  ED Course  Patient seen and examined. History obtained directly from patient. Work-up including labs, imaging, EKG ordered in triage, if performed, were reviewed.    Labs/EKG: None ordered  Imaging: Independently reviewed and interpreted.  This included: CT head and cervical spine, agree negative, nasal fractures are remote related to a surgery in the  past.  X-ray of the left shoulder and left elbow, agree negative without dislocation or fracture.  Medications/Fluids: None ordered  Most recent vital signs reviewed and are as follows: BP (!) 174/88 (BP Location: Right Arm)   Pulse 76   Temp 98.2 F (36.8 C) (Oral)   Resp 15   SpO2 96%   Initial impression: Mechanical fall on ice, head injury.  Reassuring imaging.  Patient will be given a sling for his left arm.  Discussed that he needs to take the arm out and use it at times throughout the day.  Home treatment plan: RICE protocol, OTC meds, wound care for facial abrasions.  Return instructions discussed with patient: Patient/family was counseled on head injury precautions and symptoms that should indicate their return to the ED. These include severe worsening headache, vision changes, confusion,  loss of consciousness, trouble walking, nausea & vomiting, or weakness/tingling in extremities.    Follow-up instructions discussed with patient: PCP 1 week as needed for any bothersome residual symptoms.                                   Medical Decision Making Amount and/or Complexity of Data Reviewed Radiology: ordered.   Patient presents with family after mechanical fall today.  He sustained left-sided facial abrasions and left shoulder injury.  Imaging of the head and neck were negative.  X-ray of the shoulder and elbow are negative.  He looks well.  He is ambulatory.  No concerns for thoracic or abdominal trauma at this time.  Will provide with sling, but recommend routine care at this time.  Tetanus is up-to-date.     Final diagnoses:  Abrasion of face, initial encounter  Acute pain of left shoulder  Fall, initial encounter    ED Discharge Orders     None          Desiderio Chew, [place] 03/06/24 1311  "

## 2024-03-06 NOTE — Discharge Instructions (Signed)
 Please read and follow all provided instructions.  Your diagnoses today include:  1. Abrasion of face, initial encounter   2. Acute pain of left shoulder   3. Fall, initial encounter     Tests performed today include: CT scan of your head and cervical spine that did not show any serious injury. X-ray of the left shoulder and elbow did not show fracture or other injury Vital signs. See below for your results today.   Medications prescribed:  Please use over-the-counter NSAID medications (ibuprofen, naproxen) or Tylenol (acetaminophen) as directed on the packaging for pain -- as long as you do not have any reasons avoid these medications. Reasons to avoid NSAID medications include: weak kidneys, a history of bleeding in your stomach or gut, or uncontrolled high blood pressure or previous heart attack. Reasons to avoid Tylenol include: liver problems or ongoing alcohol use. Never take more than 4000mg  or 8 Extra strength Tylenol in a 24 hour period.     Take any prescribed medications only as directed.  Home care instructions:  Follow any educational materials contained in this packet.  BE VERY CAREFUL not to take multiple medicines containing Tylenol (also called acetaminophen). Doing so can lead to an overdose which can damage your liver and cause liver failure and possibly death.   Follow-up instructions: Please follow-up with your primary care provider in the next 7 days for further evaluation of your symptoms if not improving.   Return instructions:  SEEK IMMEDIATE MEDICAL ATTENTION IF: There is confusion or drowsiness (although children frequently become drowsy after injury).  You cannot awaken the injured person.  You have more than one episode of vomiting.  You notice dizziness or unsteadiness which is getting worse, or inability to walk.  You have convulsions or unconsciousness.  You experience severe, persistent headaches not relieved by Tylenol. You cannot use arms or legs  normally.  There are changes in pupil sizes. (This is the black center in the colored part of the eye)  There is clear or bloody discharge from the nose or ears.  You have change in speech, vision, swallowing, or understanding.  Localized weakness, numbness, tingling, or change in bowel or bladder control. You have any other emergent concerns.  Additional Information: You have had a head injury which does not appear to require admission at this time.  Your vital signs today were: BP (!) 174/88 (BP Location: Right Arm)   Pulse 76   Temp 98.2 F (36.8 C) (Oral)   Resp 15   SpO2 96%  If your blood pressure (BP) was elevated above 135/85 this visit, please have this repeated by your doctor within one month. --------------

## 2024-03-06 NOTE — ED Triage Notes (Signed)
 Mechanical fall today over ice. Hit head. No thinners. C/o left shoulder pain and left elbow pain.

## 2024-05-24 ENCOUNTER — Other Ambulatory Visit

## 2024-08-04 ENCOUNTER — Ambulatory Visit

## 2024-08-13 ENCOUNTER — Ambulatory Visit: Admitting: Family Medicine

## 2025-02-13 ENCOUNTER — Other Ambulatory Visit

## 2025-02-20 ENCOUNTER — Encounter: Admitting: Family Medicine
